# Patient Record
Sex: Male | Born: 1935
Health system: Southern US, Community
[De-identification: ages and names within clinical notes are randomized; demographics above are authoritative.]

## PROBLEM LIST (undated history)

## (undated) DIAGNOSIS — R55 Syncope and collapse: Secondary | ICD-10-CM

## (undated) DIAGNOSIS — D469 Myelodysplastic syndrome, unspecified: Secondary | ICD-10-CM

## (undated) DIAGNOSIS — I10 Essential (primary) hypertension: Secondary | ICD-10-CM

## (undated) DIAGNOSIS — I455 Other specified heart block: Secondary | ICD-10-CM

## (undated) DIAGNOSIS — E119 Type 2 diabetes mellitus without complications: Secondary | ICD-10-CM

## (undated) DIAGNOSIS — Z950 Presence of cardiac pacemaker: Secondary | ICD-10-CM

## (undated) HISTORY — PX: CATARACT EXTRACTION: SUR2

## (undated) HISTORY — PX: EYE SURGERY: SHX253

---

## 2015-12-24 HISTORY — PX: PORTACATH PLACEMENT: SHX2246

## 2015-12-24 HISTORY — PX: BONE MARROW BIOPSY: SHX199

## 2016-08-15 DIAGNOSIS — G5621 Lesion of ulnar nerve, right upper limb: Secondary | ICD-10-CM | POA: Diagnosis not present

## 2016-08-15 DIAGNOSIS — G5622 Lesion of ulnar nerve, left upper limb: Secondary | ICD-10-CM | POA: Diagnosis not present

## 2016-08-15 DIAGNOSIS — G5601 Carpal tunnel syndrome, right upper limb: Secondary | ICD-10-CM | POA: Diagnosis not present

## 2016-08-15 DIAGNOSIS — G5602 Carpal tunnel syndrome, left upper limb: Secondary | ICD-10-CM | POA: Diagnosis not present

## 2016-08-15 DIAGNOSIS — G629 Polyneuropathy, unspecified: Secondary | ICD-10-CM | POA: Diagnosis not present

## 2016-12-31 DIAGNOSIS — R0609 Other forms of dyspnea: Secondary | ICD-10-CM | POA: Diagnosis not present

## 2016-12-31 DIAGNOSIS — D649 Anemia, unspecified: Secondary | ICD-10-CM | POA: Diagnosis not present

## 2016-12-31 DIAGNOSIS — I119 Hypertensive heart disease without heart failure: Secondary | ICD-10-CM | POA: Diagnosis not present

## 2016-12-31 DIAGNOSIS — E119 Type 2 diabetes mellitus without complications: Secondary | ICD-10-CM | POA: Diagnosis not present

## 2017-01-02 DIAGNOSIS — D709 Neutropenia, unspecified: Secondary | ICD-10-CM | POA: Diagnosis not present

## 2017-01-02 DIAGNOSIS — D473 Essential (hemorrhagic) thrombocythemia: Secondary | ICD-10-CM | POA: Diagnosis not present

## 2017-01-02 DIAGNOSIS — D61818 Other pancytopenia: Secondary | ICD-10-CM | POA: Diagnosis not present

## 2017-01-02 DIAGNOSIS — D649 Anemia, unspecified: Secondary | ICD-10-CM | POA: Diagnosis not present

## 2017-01-07 DIAGNOSIS — R0600 Dyspnea, unspecified: Secondary | ICD-10-CM | POA: Diagnosis not present

## 2017-01-09 DIAGNOSIS — R42 Dizziness and giddiness: Secondary | ICD-10-CM | POA: Diagnosis not present

## 2017-01-09 DIAGNOSIS — D649 Anemia, unspecified: Secondary | ICD-10-CM | POA: Diagnosis not present

## 2017-01-09 DIAGNOSIS — E119 Type 2 diabetes mellitus without complications: Secondary | ICD-10-CM | POA: Diagnosis not present

## 2017-01-09 DIAGNOSIS — D709 Neutropenia, unspecified: Secondary | ICD-10-CM | POA: Diagnosis not present

## 2017-01-09 DIAGNOSIS — D509 Iron deficiency anemia, unspecified: Secondary | ICD-10-CM | POA: Diagnosis not present

## 2017-01-09 DIAGNOSIS — I495 Sick sinus syndrome: Secondary | ICD-10-CM | POA: Diagnosis not present

## 2017-01-09 DIAGNOSIS — D473 Essential (hemorrhagic) thrombocythemia: Secondary | ICD-10-CM | POA: Diagnosis not present

## 2017-01-09 DIAGNOSIS — D72819 Decreased white blood cell count, unspecified: Secondary | ICD-10-CM | POA: Diagnosis not present

## 2017-01-09 DIAGNOSIS — I119 Hypertensive heart disease without heart failure: Secondary | ICD-10-CM | POA: Diagnosis not present

## 2017-01-17 DIAGNOSIS — R0609 Other forms of dyspnea: Secondary | ICD-10-CM | POA: Diagnosis not present

## 2017-01-17 DIAGNOSIS — R0600 Dyspnea, unspecified: Secondary | ICD-10-CM | POA: Diagnosis not present

## 2017-01-20 DIAGNOSIS — H903 Sensorineural hearing loss, bilateral: Secondary | ICD-10-CM | POA: Diagnosis not present

## 2017-01-20 DIAGNOSIS — R269 Unspecified abnormalities of gait and mobility: Secondary | ICD-10-CM | POA: Diagnosis not present

## 2017-01-20 DIAGNOSIS — E119 Type 2 diabetes mellitus without complications: Secondary | ICD-10-CM | POA: Diagnosis not present

## 2017-01-20 DIAGNOSIS — H8101 Meniere's disease, right ear: Secondary | ICD-10-CM | POA: Diagnosis not present

## 2017-01-20 DIAGNOSIS — D649 Anemia, unspecified: Secondary | ICD-10-CM | POA: Diagnosis not present

## 2017-01-20 DIAGNOSIS — D709 Neutropenia, unspecified: Secondary | ICD-10-CM | POA: Diagnosis not present

## 2017-01-20 DIAGNOSIS — D473 Essential (hemorrhagic) thrombocythemia: Secondary | ICD-10-CM | POA: Diagnosis not present

## 2017-01-22 DIAGNOSIS — R0609 Other forms of dyspnea: Secondary | ICD-10-CM | POA: Diagnosis not present

## 2017-01-24 DIAGNOSIS — D649 Anemia, unspecified: Secondary | ICD-10-CM | POA: Diagnosis not present

## 2017-01-24 DIAGNOSIS — Z79899 Other long term (current) drug therapy: Secondary | ICD-10-CM | POA: Diagnosis not present

## 2017-01-24 DIAGNOSIS — R0609 Other forms of dyspnea: Secondary | ICD-10-CM | POA: Diagnosis not present

## 2017-01-25 DIAGNOSIS — D649 Anemia, unspecified: Secondary | ICD-10-CM | POA: Diagnosis not present

## 2017-01-25 DIAGNOSIS — D709 Neutropenia, unspecified: Secondary | ICD-10-CM | POA: Diagnosis not present

## 2017-01-25 DIAGNOSIS — D473 Essential (hemorrhagic) thrombocythemia: Secondary | ICD-10-CM | POA: Diagnosis not present

## 2017-01-25 DIAGNOSIS — Z23 Encounter for immunization: Secondary | ICD-10-CM | POA: Diagnosis not present

## 2017-01-26 DIAGNOSIS — G4710 Hypersomnia, unspecified: Secondary | ICD-10-CM | POA: Diagnosis not present

## 2017-01-30 DIAGNOSIS — D471 Chronic myeloproliferative disease: Secondary | ICD-10-CM | POA: Diagnosis not present

## 2017-01-30 DIAGNOSIS — D649 Anemia, unspecified: Secondary | ICD-10-CM | POA: Diagnosis not present

## 2017-01-30 DIAGNOSIS — D473 Essential (hemorrhagic) thrombocythemia: Secondary | ICD-10-CM | POA: Diagnosis not present

## 2017-01-30 DIAGNOSIS — D709 Neutropenia, unspecified: Secondary | ICD-10-CM | POA: Diagnosis not present

## 2017-01-30 DIAGNOSIS — D61818 Other pancytopenia: Secondary | ICD-10-CM | POA: Diagnosis not present

## 2017-02-04 DIAGNOSIS — D471 Chronic myeloproliferative disease: Secondary | ICD-10-CM | POA: Diagnosis not present

## 2017-02-05 DIAGNOSIS — Z7189 Other specified counseling: Secondary | ICD-10-CM | POA: Diagnosis not present

## 2017-02-05 DIAGNOSIS — Z79899 Other long term (current) drug therapy: Secondary | ICD-10-CM | POA: Diagnosis not present

## 2017-02-05 DIAGNOSIS — D471 Chronic myeloproliferative disease: Secondary | ICD-10-CM | POA: Diagnosis not present

## 2017-02-12 DIAGNOSIS — D471 Chronic myeloproliferative disease: Secondary | ICD-10-CM | POA: Diagnosis not present

## 2017-02-15 DIAGNOSIS — D649 Anemia, unspecified: Secondary | ICD-10-CM | POA: Diagnosis not present

## 2017-02-15 DIAGNOSIS — E119 Type 2 diabetes mellitus without complications: Secondary | ICD-10-CM | POA: Diagnosis not present

## 2017-02-15 DIAGNOSIS — G40909 Epilepsy, unspecified, not intractable, without status epilepticus: Secondary | ICD-10-CM | POA: Diagnosis not present

## 2017-02-15 DIAGNOSIS — R569 Unspecified convulsions: Secondary | ICD-10-CM | POA: Diagnosis not present

## 2017-02-15 DIAGNOSIS — D72819 Decreased white blood cell count, unspecified: Secondary | ICD-10-CM | POA: Diagnosis not present

## 2017-02-15 DIAGNOSIS — D709 Neutropenia, unspecified: Secondary | ICD-10-CM | POA: Diagnosis not present

## 2017-02-15 DIAGNOSIS — R55 Syncope and collapse: Secondary | ICD-10-CM | POA: Diagnosis not present

## 2017-02-15 DIAGNOSIS — R079 Chest pain, unspecified: Secondary | ICD-10-CM | POA: Diagnosis not present

## 2017-02-15 DIAGNOSIS — I6782 Cerebral ischemia: Secondary | ICD-10-CM | POA: Diagnosis not present

## 2017-02-16 DIAGNOSIS — I6529 Occlusion and stenosis of unspecified carotid artery: Secondary | ICD-10-CM | POA: Diagnosis not present

## 2017-02-16 DIAGNOSIS — I6501 Occlusion and stenosis of right vertebral artery: Secondary | ICD-10-CM | POA: Diagnosis present

## 2017-02-16 DIAGNOSIS — E119 Type 2 diabetes mellitus without complications: Secondary | ICD-10-CM | POA: Diagnosis present

## 2017-02-16 DIAGNOSIS — D72819 Decreased white blood cell count, unspecified: Secondary | ICD-10-CM | POA: Diagnosis not present

## 2017-02-16 DIAGNOSIS — I10 Essential (primary) hypertension: Secondary | ICD-10-CM | POA: Diagnosis present

## 2017-02-16 DIAGNOSIS — G4733 Obstructive sleep apnea (adult) (pediatric): Secondary | ICD-10-CM | POA: Diagnosis present

## 2017-02-16 DIAGNOSIS — Z79899 Other long term (current) drug therapy: Secondary | ICD-10-CM | POA: Diagnosis not present

## 2017-02-16 DIAGNOSIS — H8109 Meniere's disease, unspecified ear: Secondary | ICD-10-CM | POA: Diagnosis present

## 2017-02-16 DIAGNOSIS — D709 Neutropenia, unspecified: Secondary | ICD-10-CM | POA: Diagnosis present

## 2017-02-16 DIAGNOSIS — R55 Syncope and collapse: Secondary | ICD-10-CM | POA: Diagnosis not present

## 2017-02-16 DIAGNOSIS — G40909 Epilepsy, unspecified, not intractable, without status epilepticus: Secondary | ICD-10-CM | POA: Diagnosis not present

## 2017-02-16 DIAGNOSIS — I482 Chronic atrial fibrillation: Secondary | ICD-10-CM | POA: Diagnosis present

## 2017-02-16 DIAGNOSIS — R32 Unspecified urinary incontinence: Secondary | ICD-10-CM | POA: Diagnosis present

## 2017-02-16 DIAGNOSIS — R569 Unspecified convulsions: Secondary | ICD-10-CM | POA: Diagnosis present

## 2017-02-16 DIAGNOSIS — I480 Paroxysmal atrial fibrillation: Secondary | ICD-10-CM | POA: Diagnosis present

## 2017-02-16 DIAGNOSIS — F418 Other specified anxiety disorders: Secondary | ICD-10-CM | POA: Diagnosis present

## 2017-02-16 DIAGNOSIS — I6523 Occlusion and stenosis of bilateral carotid arteries: Secondary | ICD-10-CM | POA: Diagnosis not present

## 2017-02-16 DIAGNOSIS — D471 Chronic myeloproliferative disease: Secondary | ICD-10-CM | POA: Diagnosis present

## 2017-02-16 DIAGNOSIS — Z794 Long term (current) use of insulin: Secondary | ICD-10-CM | POA: Diagnosis not present

## 2017-02-16 DIAGNOSIS — D649 Anemia, unspecified: Secondary | ICD-10-CM | POA: Diagnosis present

## 2017-02-19 DIAGNOSIS — D471 Chronic myeloproliferative disease: Secondary | ICD-10-CM | POA: Diagnosis not present

## 2017-02-19 DIAGNOSIS — I119 Hypertensive heart disease without heart failure: Secondary | ICD-10-CM | POA: Diagnosis not present

## 2017-02-24 DIAGNOSIS — R05 Cough: Secondary | ICD-10-CM | POA: Diagnosis not present

## 2017-02-24 DIAGNOSIS — D630 Anemia in neoplastic disease: Secondary | ICD-10-CM | POA: Diagnosis not present

## 2017-02-24 DIAGNOSIS — R0600 Dyspnea, unspecified: Secondary | ICD-10-CM | POA: Diagnosis not present

## 2017-02-24 DIAGNOSIS — Z79899 Other long term (current) drug therapy: Secondary | ICD-10-CM | POA: Diagnosis not present

## 2017-02-24 DIAGNOSIS — E119 Type 2 diabetes mellitus without complications: Secondary | ICD-10-CM | POA: Diagnosis not present

## 2017-02-24 DIAGNOSIS — D649 Anemia, unspecified: Secondary | ICD-10-CM | POA: Diagnosis not present

## 2017-02-24 DIAGNOSIS — Z8589 Personal history of malignant neoplasm of other organs and systems: Secondary | ICD-10-CM | POA: Diagnosis not present

## 2017-02-24 DIAGNOSIS — E1165 Type 2 diabetes mellitus with hyperglycemia: Secondary | ICD-10-CM | POA: Diagnosis not present

## 2017-02-24 DIAGNOSIS — I10 Essential (primary) hypertension: Secondary | ICD-10-CM | POA: Diagnosis not present

## 2017-02-24 DIAGNOSIS — R531 Weakness: Secondary | ICD-10-CM | POA: Diagnosis not present

## 2017-02-24 DIAGNOSIS — R0602 Shortness of breath: Secondary | ICD-10-CM | POA: Diagnosis not present

## 2017-02-24 DIAGNOSIS — D696 Thrombocytopenia, unspecified: Secondary | ICD-10-CM | POA: Diagnosis not present

## 2017-02-24 DIAGNOSIS — D709 Neutropenia, unspecified: Secondary | ICD-10-CM | POA: Diagnosis not present

## 2017-02-24 DIAGNOSIS — Z794 Long term (current) use of insulin: Secondary | ICD-10-CM | POA: Diagnosis not present

## 2017-02-25 DIAGNOSIS — R0600 Dyspnea, unspecified: Secondary | ICD-10-CM | POA: Diagnosis not present

## 2017-02-26 DIAGNOSIS — D471 Chronic myeloproliferative disease: Secondary | ICD-10-CM | POA: Diagnosis not present

## 2017-02-26 DIAGNOSIS — N289 Disorder of kidney and ureter, unspecified: Secondary | ICD-10-CM | POA: Diagnosis not present

## 2017-02-26 DIAGNOSIS — D709 Neutropenia, unspecified: Secondary | ICD-10-CM | POA: Diagnosis not present

## 2017-02-26 DIAGNOSIS — D649 Anemia, unspecified: Secondary | ICD-10-CM | POA: Diagnosis not present

## 2017-03-05 DIAGNOSIS — R4020 Unspecified coma: Secondary | ICD-10-CM | POA: Diagnosis not present

## 2017-03-05 DIAGNOSIS — I119 Hypertensive heart disease without heart failure: Secondary | ICD-10-CM | POA: Diagnosis not present

## 2017-03-05 DIAGNOSIS — D649 Anemia, unspecified: Secondary | ICD-10-CM | POA: Diagnosis not present

## 2017-03-06 DIAGNOSIS — R55 Syncope and collapse: Secondary | ICD-10-CM | POA: Diagnosis not present

## 2017-03-06 DIAGNOSIS — R600 Localized edema: Secondary | ICD-10-CM | POA: Diagnosis not present

## 2017-03-11 DIAGNOSIS — D471 Chronic myeloproliferative disease: Secondary | ICD-10-CM | POA: Diagnosis not present

## 2017-03-11 DIAGNOSIS — D649 Anemia, unspecified: Secondary | ICD-10-CM | POA: Diagnosis not present

## 2017-03-11 DIAGNOSIS — E875 Hyperkalemia: Secondary | ICD-10-CM | POA: Diagnosis not present

## 2017-03-11 DIAGNOSIS — R55 Syncope and collapse: Secondary | ICD-10-CM | POA: Diagnosis not present

## 2017-03-11 DIAGNOSIS — R7989 Other specified abnormal findings of blood chemistry: Secondary | ICD-10-CM | POA: Diagnosis not present

## 2017-03-11 DIAGNOSIS — R569 Unspecified convulsions: Secondary | ICD-10-CM | POA: Diagnosis not present

## 2017-03-19 DIAGNOSIS — Z95818 Presence of other cardiac implants and grafts: Secondary | ICD-10-CM | POA: Diagnosis not present

## 2017-03-25 DIAGNOSIS — I119 Hypertensive heart disease without heart failure: Secondary | ICD-10-CM | POA: Diagnosis not present

## 2017-03-25 DIAGNOSIS — R911 Solitary pulmonary nodule: Secondary | ICD-10-CM | POA: Diagnosis not present

## 2017-03-26 DIAGNOSIS — D473 Essential (hemorrhagic) thrombocythemia: Secondary | ICD-10-CM | POA: Diagnosis not present

## 2017-03-26 DIAGNOSIS — D709 Neutropenia, unspecified: Secondary | ICD-10-CM | POA: Diagnosis not present

## 2017-03-26 DIAGNOSIS — N289 Disorder of kidney and ureter, unspecified: Secondary | ICD-10-CM | POA: Diagnosis not present

## 2017-03-26 DIAGNOSIS — D471 Chronic myeloproliferative disease: Secondary | ICD-10-CM | POA: Diagnosis not present

## 2017-04-19 DIAGNOSIS — Z95818 Presence of other cardiac implants and grafts: Secondary | ICD-10-CM | POA: Diagnosis not present

## 2017-05-09 DIAGNOSIS — D7581 Myelofibrosis: Secondary | ICD-10-CM | POA: Diagnosis not present

## 2017-05-09 DIAGNOSIS — I480 Paroxysmal atrial fibrillation: Secondary | ICD-10-CM | POA: Diagnosis not present

## 2017-05-09 DIAGNOSIS — H8113 Benign paroxysmal vertigo, bilateral: Secondary | ICD-10-CM | POA: Diagnosis not present

## 2017-05-09 DIAGNOSIS — E1165 Type 2 diabetes mellitus with hyperglycemia: Secondary | ICD-10-CM | POA: Diagnosis not present

## 2017-05-09 DIAGNOSIS — I119 Hypertensive heart disease without heart failure: Secondary | ICD-10-CM | POA: Diagnosis not present

## 2017-05-20 DIAGNOSIS — Z95818 Presence of other cardiac implants and grafts: Secondary | ICD-10-CM | POA: Diagnosis not present

## 2017-06-05 ENCOUNTER — Encounter (HOSPITAL_COMMUNITY): Payer: Self-pay | Admitting: *Deleted

## 2017-06-05 ENCOUNTER — Emergency Department (HOSPITAL_COMMUNITY)
Admission: EM | Admit: 2017-06-05 | Discharge: 2017-06-06 | Disposition: A | Discharge status: Home / Self Care | Payer: Medicare Other | Attending: Emergency Medicine | Admitting: Emergency Medicine

## 2017-06-05 DIAGNOSIS — E1165 Type 2 diabetes mellitus with hyperglycemia: Secondary | ICD-10-CM | POA: Diagnosis not present

## 2017-06-05 DIAGNOSIS — R739 Hyperglycemia, unspecified: Secondary | ICD-10-CM

## 2017-06-05 DIAGNOSIS — Z794 Long term (current) use of insulin: Secondary | ICD-10-CM | POA: Diagnosis not present

## 2017-06-05 DIAGNOSIS — Z79899 Other long term (current) drug therapy: Secondary | ICD-10-CM | POA: Diagnosis not present

## 2017-06-05 DIAGNOSIS — N471 Phimosis: Secondary | ICD-10-CM | POA: Diagnosis not present

## 2017-06-05 DIAGNOSIS — I10 Essential (primary) hypertension: Secondary | ICD-10-CM | POA: Insufficient documentation

## 2017-06-05 DIAGNOSIS — N4829 Other inflammatory disorders of penis: Secondary | ICD-10-CM | POA: Diagnosis present

## 2017-06-05 DIAGNOSIS — N481 Balanitis: Secondary | ICD-10-CM | POA: Diagnosis not present

## 2017-06-05 HISTORY — DX: Type 2 diabetes mellitus without complications: E11.9

## 2017-06-05 HISTORY — DX: Essential (primary) hypertension: I10

## 2017-06-05 LAB — CBC
HCT: 27.6 % — ABNORMAL LOW (ref 39.0–52.0)
Hemoglobin: 8.5 g/dL — ABNORMAL LOW (ref 13.0–17.0)
MCH: 22.2 pg — ABNORMAL LOW (ref 26.0–34.0)
MCHC: 30.8 g/dL (ref 30.0–36.0)
MCV: 72.1 fL — ABNORMAL LOW (ref 78.0–100.0)
Platelets: 327 10*3/uL (ref 150–400)
RBC: 3.83 MIL/uL — ABNORMAL LOW (ref 4.22–5.81)
RDW: 24.3 % — ABNORMAL HIGH (ref 11.5–15.5)
WBC: 2.4 10*3/uL — ABNORMAL LOW (ref 4.0–10.5)

## 2017-06-05 LAB — COMPREHENSIVE METABOLIC PANEL
ALT: 12 U/L — ABNORMAL LOW (ref 17–63)
AST: 22 U/L (ref 15–41)
Albumin: 3.4 g/dL — ABNORMAL LOW (ref 3.5–5.0)
Alkaline Phosphatase: 141 U/L — ABNORMAL HIGH (ref 38–126)
Anion gap: 8 (ref 5–15)
BUN: 17 mg/dL (ref 6–20)
CO2: 23 mmol/L (ref 22–32)
Calcium: 8.5 mg/dL — ABNORMAL LOW (ref 8.9–10.3)
Chloride: 96 mmol/L — ABNORMAL LOW (ref 101–111)
Creatinine, Ser: 1.33 mg/dL — ABNORMAL HIGH (ref 0.61–1.24)
GFR calc Af Amer: 55 mL/min — ABNORMAL LOW (ref >60)
GFR calc non Af Amer: 47 mL/min — ABNORMAL LOW (ref >60)
Glucose, Bld: 547 mg/dL (ref 65–99)
Potassium: 4.2 mmol/L (ref 3.5–5.1)
Sodium: 127 mmol/L — ABNORMAL LOW (ref 135–145)
Total Bilirubin: 1 mg/dL (ref 0.3–1.2)
Total Protein: 7.2 g/dL (ref 6.5–8.1)

## 2017-06-05 LAB — URINALYSIS, ROUTINE W REFLEX MICROSCOPIC
Bilirubin Urine: NEGATIVE
Glucose, UA: >=500 mg/dL — AB
Hgb urine dipstick: NEGATIVE
Ketones, ur: NEGATIVE mg/dL
Leukocytes, UA: NEGATIVE
Nitrite: NEGATIVE
Protein, ur: NEGATIVE mg/dL
Specific Gravity, Urine: 1.025 (ref 1.005–1.030)
pH: 6 (ref 5.0–8.0)

## 2017-06-05 LAB — I-STAT CG4 LACTIC ACID, ED
Lactic Acid, Venous: 1.18 mmol/L (ref 0.5–1.9)
Lactic Acid, Venous: 1.43 mmol/L (ref 0.5–1.9)

## 2017-06-05 MED ORDER — SODIUM CHLORIDE 0.9 % IV BOLUS (SEPSIS)
2000.0000 mL | Freq: Once | INTRAVENOUS | Status: AC
  Administered 2017-06-06: 2000 mL via INTRAVENOUS

## 2017-06-05 MED ORDER — INSULIN ASPART 100 UNIT/ML ~~LOC~~ SOLN
15.0000 [IU] | Freq: Once | SUBCUTANEOUS | Status: AC
  Administered 2017-06-06: 15 [IU] via INTRAVENOUS
  Filled 2017-06-05: qty 1

## 2017-06-05 NOTE — ED Provider Notes (Signed)
[facility] DEPT Provider Note   CSN: [id number] Arrival date & time: 06/05/17  2017  By signing my name below, I, [name], attest that this documentation has been prepared under the direction and in the presence of Deno Etienne, DO. Electronically Signed: [name], ED Scribe. 06/05/17. 11:13 PM.  History   Chief Complaint Chief Complaint  Patient presents with  . Groin Swelling   The history is provided by the patient. No language interpreter was used.   HPI Comments: Aaron Nelson is a 83 y.o. male with a PMHx of DM, who presents to the Emergency Department complaining of a moderate, gradually worsening area of pain and swelling to the penis onset 2 weeks ago. Pt states he has not taken his 30 U insulin the past 2 days. Pt reports associated dysuria. No hx of similar symptoms. Pt denies concern for a STD. Pt denies SOB, fever, abdominal pain or any other complaints at this time.  Past Medical History:  Diagnosis Date  . Diabetes mellitus without complication ([place])   . Hypertension   . Leukemia ([place])     There are no active problems to display for this patient.   History reviewed. No pertinent surgical history.     Home Medications    Prior to Admission medications   Medication Sig Start Date End Date Taking? Authorizing Provider  insulin glargine (LANTUS) 100 UNIT/ML injection Inject 30 Units into the skin daily.   Yes [provider]  meclizine (ANTIVERT) 12.5 MG tablet Take 12.5 mg by mouth every evening.   Yes [provider]  clotrimazole (LOTRIMIN) 1 % cream Apply to affected area 2 times daily 06/06/17   Deno Etienne, DO    Family History No family history on file.  Social History Social History  Substance Use Topics  . Smoking status: Never Smoker  . Smokeless tobacco: Never Used  . Alcohol use No     Allergies   Patient has no known allergies.   Review of Systems Review of Systems  Constitutional: Negative for chills  and fever.  HENT: Negative for congestion and facial swelling.   Eyes: Negative for discharge and visual disturbance.  Respiratory: Negative for shortness of breath.   Cardiovascular: Negative for chest pain and palpitations.  Gastrointestinal: Negative for abdominal pain, diarrhea and vomiting.  Genitourinary: Positive for dysuria and penile swelling.  Musculoskeletal: Negative for arthralgias and myalgias.  Skin: Negative for color change and rash.  Neurological: Negative for tremors, syncope and headaches.  Psychiatric/Behavioral: Negative for confusion and dysphoric mood.     Physical Exam Updated Vital Signs BP (!) 188/66 (BP Location: Right Arm)   Pulse 67   Temp 98.1 F (36.7 C) (Oral)   Resp 15   Ht 5\' 5"  (1.651 m)   Wt 147 lb (66.7 kg)   SpO2 100%   BMI 24.46 kg/m   Physical Exam  Constitutional: He is oriented to person, place, and time. He appears well-developed and well-nourished.  Well appearing and non toxic  HENT:  Head: Normocephalic and atraumatic.  Eyes: EOM are normal. Pupils are equal, round, and reactive to light.  Neck: Normal range of motion. Neck supple. No JVD present.  Cardiovascular: Normal rate and regular rhythm.  Exam reveals no gallop and no friction rub.   No murmur heard. Pulmonary/Chest: No respiratory distress. He has no wheezes.  Abdominal: He exhibits no distension. There is no rebound and no guarding.  Genitourinary: Right testis shows no mass. Left testis shows no mass.  Phimosis present. Discharge found.  Genitourinary Comments: Exam performed with chaperone present. Obvious phimosis. Unable to retract the foreskin over the head do of the penis. Swelling over the foreskin. Yeasty like discharge. No hernias or masses.   Musculoskeletal: Normal range of motion.  Neurological: He is alert and oriented to person, place, and time.  Skin: No rash noted. No pallor.  Psychiatric: He has a normal mood and affect. His behavior is normal.    Nursing note and vitals reviewed.    ED Treatments / Results  DIAGNOSTIC STUDIES: Oxygen Saturation is 100% on RA, normal by my interpretation.   COORDINATION OF CARE: 11:07 PM-Discussed next steps with pt including use of an anti-yeast ointment and controlling DM. Pt verbalized understanding and is agreeable with the plan.   Labs (all labs ordered are listed, but only abnormal results are displayed) Labs Reviewed  COMPREHENSIVE METABOLIC PANEL - Abnormal; Notable for the following:       Result Value   Sodium 127 (*)    Chloride 96 (*)    Glucose, Bld 547 (*)    Creatinine, Ser 1.33 (*)    Calcium 8.5 (*)    Albumin 3.4 (*)    ALT 12 (*)    Alkaline Phosphatase 141 (*)    GFR calc non Af Amer 47 (*)    GFR calc Af Amer 55 (*)    All other components within normal limits  CBC - Abnormal; Notable for the following:    WBC 2.4 (*)    RBC 3.83 (*)    Hemoglobin 8.5 (*)    HCT 27.6 (*)    MCV 72.1 (*)    MCH 22.2 (*)    RDW 24.3 (*)    All other components within normal limits  URINALYSIS, ROUTINE W REFLEX MICROSCOPIC - Abnormal; Notable for the following:    Glucose, UA >=500 (*)    Bacteria, UA RARE (*)    Squamous Epithelial / LPF 0-5 (*)    All other components within normal limits  I-STAT CG4 LACTIC ACID, ED  I-STAT CG4 LACTIC ACID, ED    EKG  EKG Interpretation None       Radiology No results found.  Procedures Procedures (including critical care time)  Medications Ordered in ED Medications  sodium chloride 0.9 % bolus 2,000 mL (0 mLs Intravenous Stopped 06/06/17 0207)  insulin aspart (novoLOG) injection 15 Units (15 Units Intravenous Given 06/06/17 0042)     Initial Impression / Assessment and Plan / ED Course  I have reviewed the triage vital signs and the nursing notes.  Pertinent labs & imaging results that were available during my care of the patient were reviewed by me and considered in my medical decision making (see chart for  details).     83 yo M With balanitis. Going on for the past couple weeks. Having difficulty with his blood sugars because he has not been taking his insulin. I counseled him on taking his home medications as this will help him significantly. I counseled him on hygiene. He has a phimosis as well. Blood sugar in the 500s. He was given 2 L of fluid and insulin with significant improvement. Follow with his PCP.  5:34 AM:  I have discussed the diagnosis/risks/treatment options with the patient and family and believe the pt to be eligible for discharge home to follow-up with PCP. We also discussed returning to the ED immediately if new or worsening sx occur. We discussed the sx which are most  concerning (e.g., sudden worsening pain, fever, inability to tolerate by mouth) that necessitate immediate return. Medications administered to the patient during their visit and any new prescriptions provided to the patient are listed below.  Medications given during this visit Medications  sodium chloride 0.9 % bolus 2,000 mL (0 mLs Intravenous Stopped 06/06/17 0207)  insulin aspart (novoLOG) injection 15 Units (15 Units Intravenous Given 06/06/17 0042)     The patient appears reasonably screen and/or stabilized for discharge and I doubt any other medical condition or other [facility] requiring further screening, evaluation, or treatment in the ED at this time prior to discharge.    Final Clinical Impressions(s) / ED Diagnoses   Final diagnoses:  Balanitis  Hyperglycemia  Phimosis    New Prescriptions Discharge Medication List as of 06/06/2017  1:53 AM    START taking these medications   Details  clotrimazole (LOTRIMIN) 1 % cream Apply to affected area 2 times daily, Print        I personally performed the services described in this documentation, which was scribed in my presence. The recorded information has been reviewed and is accurate.     Deno Etienne, DO 06/06/17 [phone]

## 2017-06-05 NOTE — ED Notes (Signed)
Lab called a high blood sugar   pts acuity increased to a 2

## 2017-06-05 NOTE — ED Triage Notes (Signed)
The pt just moved here from [place] with family here

## 2017-06-05 NOTE — ED Triage Notes (Signed)
Infection on his penis for 2 weeks  No n v or a temp

## 2017-06-06 MED ORDER — CLOTRIMAZOLE 1 % EX CREA: TOPICAL_CREAM | CUTANEOUS | 0 refills | Status: DC

## 2017-06-06 NOTE — ED Notes (Signed)
IV attempted x 2, right forearm and right AC. Both infiltrated.

## 2017-06-06 NOTE — Discharge Instructions (Signed)
Take your insulin as prescribed!!  Warm soaks 2x a day with gentle retraction of your foreskin.  No soap, use a warm wash cloth

## 2017-06-11 ENCOUNTER — Encounter: Payer: Self-pay | Admitting: Internal Medicine

## 2017-06-11 ENCOUNTER — Ambulatory Visit: Payer: Medicare Other | Attending: Internal Medicine | Admitting: Internal Medicine

## 2017-06-11 VITALS — BP 157/66 | HR 69 | Temp 98.5°F | Resp 18 | Ht 65.0 in | Wt 149.0 lb

## 2017-06-11 DIAGNOSIS — N401 Enlarged prostate with lower urinary tract symptoms: Secondary | ICD-10-CM

## 2017-06-11 DIAGNOSIS — I495 Sick sinus syndrome: Secondary | ICD-10-CM

## 2017-06-11 DIAGNOSIS — Z950 Presence of cardiac pacemaker: Secondary | ICD-10-CM | POA: Diagnosis not present

## 2017-06-11 DIAGNOSIS — E119 Type 2 diabetes mellitus without complications: Secondary | ICD-10-CM

## 2017-06-11 DIAGNOSIS — C9591 Leukemia, unspecified, in remission: Secondary | ICD-10-CM

## 2017-06-11 DIAGNOSIS — Z794 Long term (current) use of insulin: Secondary | ICD-10-CM | POA: Diagnosis not present

## 2017-06-11 DIAGNOSIS — I10 Essential (primary) hypertension: Secondary | ICD-10-CM | POA: Insufficient documentation

## 2017-06-11 LAB — GLUCOSE, POCT (MANUAL RESULT ENTRY)
POC GLUCOSE: 436 mg/dL — AB (ref 70–99)
POC Glucose: 424 mg/dl — AB (ref 70–99)

## 2017-06-11 MED ORDER — INSULIN ASPART 100 UNIT/ML ~~LOC~~ SOLN: 20.0000 [IU] | Freq: Once | SUBCUTANEOUS | Status: DC

## 2017-06-11 NOTE — Patient Instructions (Signed)
Diabetes Mellitus and Food It is important for you to manage your blood sugar (glucose) level. Your blood glucose level can be greatly affected by what you eat. Eating healthier foods in the appropriate amounts throughout the day at about the same time each day will help you control your blood glucose level. It can also help slow or prevent worsening of your diabetes mellitus. Healthy eating may even help you improve the level of your blood pressure and reach or maintain a healthy weight. General recommendations for healthful eating and cooking habits include:  Eating meals and snacks regularly. Avoid going long periods of time without eating to lose weight.  Eating a diet that consists mainly of plant-based foods, such as fruits, vegetables, nuts, legumes, and whole grains.  Using low-heat cooking methods, such as baking, instead of high-heat cooking methods, such as deep frying.  Work with your dietitian to make sure you understand how to use the Nutrition Facts information on food labels. How can food affect me? Carbohydrates Carbohydrates affect your blood glucose level more than any other type of food. Your dietitian will help you determine how many carbohydrates to eat at each meal and teach you how to count carbohydrates. Counting carbohydrates is important to keep your blood glucose at a healthy level, especially if you are using insulin or taking certain medicines for diabetes mellitus. Alcohol Alcohol can cause sudden decreases in blood glucose (hypoglycemia), especially if you use insulin or take certain medicines for diabetes mellitus. Hypoglycemia can be a life-threatening condition. Symptoms of hypoglycemia (sleepiness, dizziness, and disorientation) are similar to symptoms of having too much alcohol. If your health care provider has given you approval to drink alcohol, do so in moderation and use the following guidelines:  Women should not have more than one drink per day, and men  should not have more than two drinks per day. One drink is equal to: ? 12 oz of beer. ? 5 oz of wine. ? 1 oz of hard liquor.  Do not drink on an empty stomach.  Keep yourself hydrated. Have water, diet soda, or unsweetened iced tea.  Regular soda, juice, and other mixers might contain a lot of carbohydrates and should be counted.  What foods are not recommended? As you make food choices, it is important to remember that all foods are not the same. Some foods have fewer nutrients per serving than other foods, even though they might have the same number of calories or carbohydrates. It is difficult to get your body what it needs when you eat foods with fewer nutrients. Examples of foods that you should avoid that are high in calories and carbohydrates but low in nutrients include:  Trans fats (most processed foods list trans fats on the Nutrition Facts label).  Regular soda.  Juice.  Candy.  Sweets, such as cake, pie, doughnuts, and cookies.  Fried foods.  What foods can I eat? Eat nutrient-rich foods, which will nourish your body and keep you healthy. The food you should eat also will depend on several factors, including:  The calories you need.  The medicines you take.  Your weight.  Your blood glucose level.  Your blood pressure level.  Your cholesterol level.  You should eat a variety of foods, including:  Protein. ? Lean cuts of meat. ? Proteins low in saturated fats, such as fish, egg whites, and beans. Avoid processed meats.  Fruits and vegetables. ? Fruits and vegetables that may help control blood glucose levels, such as apples,   mangoes, and yams.  Dairy products. ? Choose fat-free or low-fat dairy products, such as milk, yogurt, and cheese.  Grains, bread, pasta, and rice. ? Choose whole grain products, such as multigrain bread, whole oats, and brown rice. These foods may help control blood pressure.  Fats. ? Foods containing healthful fats, such as  nuts, avocado, olive oil, canola oil, and fish.  Does everyone with diabetes mellitus have the same meal plan? Because every person with diabetes mellitus is different, there is not one meal plan that works for everyone. It is very important that you meet with a dietitian who will help you create a meal plan that is just right for you. This information is not intended to replace advice given to you by your health care provider. Make sure you discuss any questions you have with your health care provider. Document Released: 09/05/2005 Document Revised: 05/16/2016 Document Reviewed: 11/05/2013 Elsevier Interactive Patient Education  2017 [employer] Hypertension Hypertension, commonly called high blood pressure, is when the force of blood pumping through the arteries is too strong. The arteries are the blood vessels that carry blood from the heart throughout the body. Hypertension forces the heart to work harder to pump blood and may cause arteries to become narrow or stiff. Having untreated or uncontrolled hypertension can cause heart attacks, strokes, kidney disease, and other problems. A blood pressure reading consists of a higher number over a lower number. Ideally, your blood pressure should be below 120/80. The first ("top") number is called the systolic pressure. It is a measure of the pressure in your arteries as your heart beats. The second ("bottom") number is called the diastolic pressure. It is a measure of the pressure in your arteries as the heart relaxes. What are the causes? The cause of this condition is not known. What increases the risk? Some risk factors for high blood pressure are under your control. Others are not. Factors you can change  Smoking.  Having type 2 diabetes mellitus, high cholesterol, or both.  Not getting enough exercise or physical activity.  Being overweight.  Having too much fat, sugar, calories, or salt (sodium) in your diet.  Drinking too much  alcohol. Factors that are difficult or impossible to change  Having chronic kidney disease.  Having a family history of high blood pressure.  Age. Risk increases with age.  Race. You may be at higher risk if you are African-American.  Gender. Men are at higher risk than women before age 48. After age 2, women are at higher risk than men.  Having obstructive sleep apnea.  Stress. What are the signs or symptoms? Extremely high blood pressure (hypertensive crisis) may cause:  Headache.  Anxiety.  Shortness of breath.  Nosebleed.  Nausea and vomiting.  Severe chest pain.  Jerky movements you cannot control (seizures).  How is this diagnosed? This condition is diagnosed by measuring your blood pressure while you are seated, with your arm resting on a surface. The cuff of the blood pressure monitor will be placed directly against the skin of your upper arm at the level of your heart. It should be measured at least twice using the same arm. Certain conditions can cause a difference in blood pressure between your right and left arms. Certain factors can cause blood pressure readings to be lower or higher than normal (elevated) for a short period of time:  When your blood pressure is higher when you are in a health care provider's office than when you are at  home, this is called white coat hypertension. Most people with this condition do not need medicines.  When your blood pressure is higher at home than when you are in a health care provider's office, this is called masked hypertension. Most people with this condition may need medicines to control blood pressure.  If you have a high blood pressure reading during one visit or you have normal blood pressure with other risk factors:  You may be asked to return on a different day to have your blood pressure checked again.  You may be asked to monitor your blood pressure at home for 1 week or longer.  If you are diagnosed with  hypertension, you may have other blood or imaging tests to help your health care provider understand your overall risk for other conditions. How is this treated? This condition is treated by making healthy lifestyle changes, such as eating healthy foods, exercising more, and reducing your alcohol intake. Your health care provider may prescribe medicine if lifestyle changes are not enough to get your blood pressure under control, and if:  Your systolic blood pressure is above 130.  Your diastolic blood pressure is above 80.  Your personal target blood pressure may vary depending on your medical conditions, your age, and other factors. Follow these instructions at home: Eating and drinking  Eat a diet that is high in fiber and potassium, and low in sodium, added sugar, and fat. An example eating plan is called the DASH (Dietary Approaches to Stop Hypertension) diet. To eat this way: ? Eat plenty of fresh fruits and vegetables. Try to fill half of your plate at each meal with fruits and vegetables. ? Eat whole grains, such as whole wheat pasta, brown rice, or whole grain bread. Fill about one quarter of your plate with whole grains. ? Eat or drink low-fat dairy products, such as skim milk or low-fat yogurt. ? Avoid fatty cuts of meat, processed or cured meats, and poultry with skin. Fill about one quarter of your plate with lean proteins, such as fish, chicken without skin, beans, eggs, and tofu. ? Avoid premade and processed foods. These tend to be higher in sodium, added sugar, and fat.  Reduce your daily sodium intake. Most people with hypertension should eat less than 1,500 mg of sodium a day.  Limit alcohol intake to no more than 1 drink a day for nonpregnant women and 2 drinks a day for men. One drink equals 12 oz of beer, 5 oz of wine, or 1 oz of hard liquor. Lifestyle  Work with your health care provider to maintain a healthy body weight or to lose weight. Ask what an ideal weight is  for you.  Get at least 30 minutes of exercise that causes your heart to beat faster (aerobic exercise) most days of the week. Activities may include walking, swimming, or biking.  Include exercise to strengthen your muscles (resistance exercise), such as pilates or lifting weights, as part of your weekly exercise routine. Try to do these types of exercises for 30 minutes at least 3 days a week.  Do not use any products that contain nicotine or tobacco, such as cigarettes and e-cigarettes. If you need help quitting, ask your health care provider.  Monitor your blood pressure at home as told by your health care provider.  Keep all follow-up visits as told by your health care provider. This is important. Medicines  Take over-the-counter and prescription medicines only as told by your health care provider. Follow  directions carefully. Blood pressure medicines must be taken as prescribed.  Do not skip doses of blood pressure medicine. Doing this puts you at risk for problems and can make the medicine less effective.  Ask your health care provider about side effects or reactions to medicines that you should watch for. Contact a health care provider if:  You think you are having a reaction to a medicine you are taking.  You have headaches that keep coming back (recurring).  You feel dizzy.  You have swelling in your ankles.  You have trouble with your vision. Get help right away if:  You develop a severe headache or confusion.  You have unusual weakness or numbness.  You feel faint.  You have severe pain in your chest or abdomen.  You vomit repeatedly.  You have trouble breathing. Summary  Hypertension is when the force of blood pumping through your arteries is too strong. If this condition is not controlled, it may put you at risk for serious complications.  Your personal target blood pressure may vary depending on your medical conditions, your age, and other factors. For most  people, a normal blood pressure is less than 120/80.  Hypertension is treated with lifestyle changes, medicines, or a combination of both. Lifestyle changes include weight loss, eating a healthy, low-sodium diet, exercising more, and limiting alcohol. This information is not intended to replace advice given to you by your health care provider. Make sure you discuss any questions you have with your health care provider. Document Released: 12/09/2005 Document Revised: 11/06/2016 Document Reviewed: 11/06/2016 Elsevier Interactive Patient Education  [employer].

## 2017-06-11 NOTE — Progress Notes (Signed)
 Aaron Nelson, is a 83 y.o. male  CSN:[id number]  MRN:[id number]  DOB - 11/15/1930  CC:  Chief Complaint  Patient presents with  . Establish Care       HPI: Aaron Nelson is a 83 y.o. male with history of Hx of DM, chronic dizziness with orthostatic syncope, implanted loop recorder, myelodysplastic syndrome, BPH, and hypertension here today to establish medical care. Very pleasant elderly man, who was diagnosis of primary myelofibrosis with associated neutropenia without recurrent infections, anemia not requiring transfusions and thrombocytosis. Patient has been having hematological abnormalities from extended period of time, the earliest noted 2005. Patient underwent at least 2 bone marrow biopsy evaluations, most recent on 01/09/17 documenting the recurrent condition. Patient is here today with his son who is also my patient, for primary medical care of his diabetes currently being treated with insulin. He also needs his co-morbidities coordinated with specialists referral as he recently relocated here to live with his son. Patient claims adherence to his medications, reports no hypoglycemic episode. Lives with his son at the moment, independent with his ADL. He denies being depressed, feels safe at home. Patient has No headache, No chest pain, No abdominal pain - No Nausea, No new weakness tingling or numbness, No Cough - SOB.   No Known Allergies Past Medical History:  Diagnosis Date  . Diabetes mellitus without complication (HCC)   . Hypertension   . Leukemia (HCC)    Current Outpatient Prescriptions on File Prior to Visit  Medication Sig Dispense Refill  . clotrimazole (LOTRIMIN) 1 % cream Apply to affected area 2 times daily 15 g 0  . insulin glargine (LANTUS) 100 UNIT/ML injection Inject 30 Units into the skin daily.    . meclizine (ANTIVERT) 12.5 MG tablet Take 12.5 mg by mouth every evening.     No current facility-administered medications on file prior to visit.    No family  history on file. Social History   Social History  . Marital status: Divorced    Spouse name: N/A  . Number of children: N/A  . Years of education: N/A   Occupational History  . Not on file.   Social History Main Topics  . Smoking status: Never Smoker  . Smokeless tobacco: Never Used  . Alcohol use No  . Drug use: Unknown  . Sexual activity: Not on file   Other Topics Concern  . Not on file   Social History Narrative  . No narrative on file    Review of Systems: Constitutional: Negative for fever, chills, diaphoresis, activity change, appetite change and fatigue. HENT: Negative for ear pain, nosebleeds, congestion, facial swelling, rhinorrhea, neck pain, neck stiffness and ear discharge.  Eyes: Negative for pain, discharge, redness, itching and visual disturbance. Respiratory: Negative for cough, choking, chest tightness, shortness of breath, wheezing and stridor.  Cardiovascular: Negative for chest pain, palpitations and leg swelling. Gastrointestinal: Negative for abdominal distention. Genitourinary: Negative for dysuria, urgency, frequency, hematuria, flank pain, decreased urine volume, difficulty urinating and dyspareunia.  Musculoskeletal: Negative for back pain, joint swelling, arthralgia and gait problem. Neurological: Negative for dizziness, tremors, seizures, syncope, facial asymmetry, speech difficulty, weakness, light-headedness, numbness and headaches.  Hematological: Negative for adenopathy. Does not bruise/bleed easily. Psychiatric/Behavioral: Negative for hallucinations, behavioral problems, confusion, dysphoric mood, decreased concentration and agitation.    Objective:   Vitals:   06/11/17 1130  BP: (!) 157/66  Pulse: 69  Resp: 18  Temp: 98.5 F (36.9 C)    Physical Exam: Constitutional: Patient appears well-developed   and well-nourished. No distress. HENT: Normocephalic, atraumatic, External right and left ear normal. Oropharynx is clear and moist.    Eyes: Conjunctivae and EOM are normal. PERRLA, no scleral icterus. Neck: Normal ROM. Neck supple. No JVD. No tracheal deviation. No thyromegaly. CVS: RRR, S1/S2 +, no murmurs, no gallops, no carotid bruit.  Pulmonary: Effort and breath sounds normal, no stridor, rhonchi, wheezes, rales.  Abdominal: Soft. BS +, no distension, tenderness, rebound or guarding.  Musculoskeletal: Normal range of motion. No edema and no tenderness.  Lymphadenopathy: No lymphadenopathy noted, cervical, inguinal or axillary Neuro: Alert. Normal reflexes, muscle tone coordination. No cranial nerve deficit. Skin: Skin is warm and dry. No rash noted. Not diaphoretic. No erythema. No pallor. Psychiatric: Normal mood and affect. Behavior, judgment, thought content normal.  Lab Results  Component Value Date   WBC 2.4 (L) 06/05/2017   HGB 8.5 (L) 06/05/2017   HCT 27.6 (L) 06/05/2017   MCV 72.1 (L) 06/05/2017   PLT 327 06/05/2017   Lab Results  Component Value Date   CREATININE 1.33 (H) 06/05/2017   BUN 17 06/05/2017   NA 127 (L) 06/05/2017   K 4.2 06/05/2017   CL 96 (L) 06/05/2017   CO2 23 06/05/2017    No results found for: HGBA1C Lipid Panel  No results found for: CHOL, TRIG, HDL, CHOLHDL, VLDL, LDLCALC      Assessment and plan:   1. Type 2 diabetes mellitus without complication, unspecified whether long term insulin use (HCC)  - Glucose (CBG) - CBC with Differential/Platelet - CMP14+EGFR  2. Sick sinus syndrome (HCC)  - Ambulatory referral to Cardiology  3. Pacemaker  - Ambulatory referral to Cardiology  4. Leukemia in remission, unspecified leukemia type (HCC)  - Ambulatory referral to Oncology  5. Benign prostatic hyperplasia with lower urinary tract symptoms, symptom details unspecified  - Urinalysis, Complete  Return in about 4 weeks (around 07/09/2017), or Blood Sugar and BP management.  The patient was given clear instructions to go to ER or return to medical center if symptoms  don't improve, worsen or new problems develop. The patient verbalized understanding. The patient was told to call to get lab results if they haven't heard anything in the next week.     This note has been created with Dragon speech recognition software and smart phrase technology. Any transcriptional errors are unintentional.    , , MD, MHA, FACP, FAAP, CPE [facility] [place], [place] [phone]   06/11/2017, 12:06 PM 

## 2017-06-12 LAB — LIPID PANEL
CHOLESTEROL TOTAL: 157 mg/dL (ref 100–199)
Chol/HDL Ratio: 2.7 ratio (ref 0.0–5.0)
HDL: 59 mg/dL (ref >39)
LDL CALC: 73 mg/dL (ref 0–99)
TRIGLYCERIDES: 124 mg/dL (ref 0–149)
VLDL Cholesterol Cal: 25 mg/dL (ref 5–40)

## 2017-06-12 LAB — CBC WITH DIFFERENTIAL/PLATELET
Basophils Absolute: 0 10*3/uL (ref 0.0–0.2)
Basos: 2 %
EOS (ABSOLUTE): 0 10*3/uL (ref 0.0–0.4)
EOS: 2 %
HEMATOCRIT: 31.5 % — AB (ref 37.5–51.0)
HEMOGLOBIN: 9.2 g/dL — AB (ref 13.0–17.7)
LYMPHS ABS: 1 10*3/uL (ref 0.7–3.1)
Lymphs: 45 %
MCH: 22.7 pg — AB (ref 26.6–33.0)
MCHC: 29.2 g/dL — ABNORMAL LOW (ref 31.5–35.7)
MCV: 78 fL — AB (ref 79–97)
MONOCYTES: 9 %
Monocytes Absolute: 0.2 10*3/uL (ref 0.1–0.9)
NEUTROS ABS: 0.9 10*3/uL — AB (ref 1.4–7.0)
NRBC: 52 % — ABNORMAL HIGH (ref 0–0)
Neutrophils: 37 %
Platelets: 375 10*3/uL (ref 150–379)
RBC: 4.05 x10E6/uL — ABNORMAL LOW (ref 4.14–5.80)
RDW: 24.2 % — ABNORMAL HIGH (ref 12.3–15.4)
WBC: 2.3 10*3/uL — CL (ref 3.4–10.8)

## 2017-06-12 LAB — MICROSCOPIC EXAMINATION
Bacteria, UA: NONE SEEN
CASTS: NONE SEEN /LPF

## 2017-06-12 LAB — URINALYSIS, COMPLETE
Bilirubin, UA: NEGATIVE
Ketones, UA: NEGATIVE
Leukocytes, UA: NEGATIVE
NITRITE UA: NEGATIVE
PH UA: 6 (ref 5.0–7.5)
PROTEIN UA: NEGATIVE
RBC, UA: NEGATIVE
Specific Gravity, UA: >=1.03 — AB (ref 1.005–1.030)
Urobilinogen, Ur: 0.2 mg/dL (ref 0.2–1.0)

## 2017-06-12 LAB — CMP14+EGFR
A/G RATIO: 1.2 (ref 1.2–2.2)
ALBUMIN: 3.7 g/dL (ref 3.5–4.7)
ALT: 10 IU/L (ref 0–44)
AST: 28 IU/L (ref 0–40)
Alkaline Phosphatase: 131 IU/L — ABNORMAL HIGH (ref 39–117)
BILIRUBIN TOTAL: 0.5 mg/dL (ref 0.0–1.2)
BUN / CREAT RATIO: 14 (ref 10–24)
BUN: 17 mg/dL (ref 8–27)
CHLORIDE: 100 mmol/L (ref 96–106)
CO2: 22 mmol/L (ref 20–29)
Calcium: 9.2 mg/dL (ref 8.6–10.2)
Creatinine, Ser: 1.2 mg/dL (ref 0.76–1.27)
GFR calc non Af Amer: 55 mL/min/{1.73_m2} — ABNORMAL LOW (ref >59)
GFR, EST AFRICAN AMERICAN: 64 mL/min/{1.73_m2} (ref >59)
GLOBULIN, TOTAL: 3.2 g/dL (ref 1.5–4.5)
GLUCOSE: 370 mg/dL — AB (ref 65–99)
Potassium: 5.2 mmol/L (ref 3.5–5.2)
SODIUM: 137 mmol/L (ref 134–144)
TOTAL PROTEIN: 6.9 g/dL (ref 6.0–8.5)

## 2017-06-12 LAB — IMMATURE CELLS
MYELOCYTES: 2 % — AB (ref 0–0)
Metamyelocytes: 3 % — ABNORMAL HIGH (ref 0–0)

## 2017-06-18 ENCOUNTER — Telehealth: Payer: Self-pay | Admitting: *Deleted

## 2017-06-18 NOTE — Telephone Encounter (Signed)
Patient verified DOB Patients son is aware of labs being consistent with diagnosis and advised to FU with oncologist and continue monitoring blood sugar control with the new insulin dosage. No further questions at this time.

## 2017-06-18 NOTE — Telephone Encounter (Signed)
-----   Message from Tresa Garter, MD sent at 06/17/2017 10:17 AM EDT ----- Please inform patient that lab is consistent with his diagnosis, please follow up with oncologist as referred. Continue blood sugar control on current dose of insulin.

## 2017-06-20 ENCOUNTER — Ambulatory Visit (INDEPENDENT_AMBULATORY_CARE_PROVIDER_SITE_OTHER): Payer: Medicare Other | Admitting: Internal Medicine

## 2017-06-20 ENCOUNTER — Encounter: Payer: Self-pay | Admitting: Internal Medicine

## 2017-06-20 VITALS — BP 112/68 | HR 73 | Ht 65.0 in | Wt 147.8 lb

## 2017-06-20 DIAGNOSIS — Z950 Presence of cardiac pacemaker: Secondary | ICD-10-CM | POA: Diagnosis not present

## 2017-06-20 DIAGNOSIS — Z95818 Presence of other cardiac implants and grafts: Secondary | ICD-10-CM | POA: Diagnosis not present

## 2017-06-20 DIAGNOSIS — I495 Sick sinus syndrome: Secondary | ICD-10-CM

## 2017-06-20 NOTE — Progress Notes (Signed)
ELECTROPHYSIOLOGY CONSULT NOTE  Patient ID: Aaron Nelson, MRN: [id number], DOB/AGE: February 28, 1930 83 y.o. Admit date: (Not on file) Date of Consult: 06/20/2017  Primary Physician: Tresa Garter, MD Primary Cardiologist: Lucina Mellow   Aaron Nelson is being seen today for the evaluation of dizziness at the request of  Tresa Garter, MD.   HPI Aaron Nelson is a 83 y.o. male  Referred from [facility] because of dizziness and an implantable device.  He has a history of a syncopal episode associated with standing. He has had frequent problems with orthostatic dizziness and post micturition dizziness. He is a long-standing history of hypertension. He has no known cardiac disease. Because of his spells, he had an implantable loop recorder inserted in [place]  We have scanned records. He is not on antihypertensive therapy despite him being hypertensive. He has diabetes. He has no known cardiac disease. He describes he has some kind of tumor for which she was given chemotherapy 6 doses.???      Past Medical History:  Diagnosis Date  . Diabetes mellitus without complication ([place])   . Hypertension   . Leukemia [facility])       Surgical History: No past surgical history on file.   Home Meds: Prior to Admission medications   Medication Sig Start Date End Date Taking? Authorizing Provider  clotrimazole (LOTRIMIN) 1 % cream Apply to affected area 2 times daily 06/06/17  Yes Deno Etienne, DO  insulin glargine (LANTUS) 100 UNIT/ML injection Inject 30 Units into the skin daily.   Yes [provider]  meclizine (ANTIVERT) 12.5 MG tablet Take 12.5 mg by mouth every evening.   Yes [provider]    Allergies: No Known Allergies  Social History   Social History  . Marital status: Divorced    Spouse name: N/A  . Number of children: N/A  . Years of education: N/A   Occupational History  . Not on file.   Social History Main Topics  . Smoking status: Never Smoker    . Smokeless tobacco: Never Used  . Alcohol use No  . Drug use: Unknown  . Sexual activity: Not on file   Other Topics Concern  . Not on file   Social History Narrative  . No narrative on file     No family history on file.   ROS:  Please see the history of present illness.     All other systems reviewed and negative.    Physical Exam: Blood pressure 112/68, pulse 73, height 5\' 5"  (1.651 m), weight 147 lb 12.8 oz (67 kg). General: Well developed, well nourished male in no acute distress. Head: Normocephalic, atraumatic, sclera non-icteric, no xanthomas, nares are without discharge. EENT: normal  Lymph Nodes:  none Neck: Negative for carotid bruits. JVD not elevated. Back:without scoliosis kyphosis Lungs: Clear bilaterally to auscultation without wheezes, rales, or rhonchi. Breathing is unlabored. Heart: RRR with S1 S2. 2/6 systolic murmur . No rubs, or gallops appreciated. Abdomen: Soft, non-tender, non-distended with normoactive bowel sounds. No hepatomegaly. No rebound/guarding. No obvious abdominal masses. Msk:  Strength and tone appear normal for age. Extremities: No clubbing or cyanosis. No  edema.  Distal pedal pulses are 2+ and equal bilaterally. Skin: Warm and Dry Neuro: Alert and oriented X 3. CN III-XII intact Grossly normal sensory and motor function . Psych:  Responds to questions appropriately with a normal affect.      Labs: Cardiac Enzymes No results for input(s): CKTOTAL, CKMB, TROPONINI in  the last 72 hours. CBC Lab Results  Component Value Date   WBC 2.3 (LL) 06/11/2017   HGB 9.2 (L) 06/11/2017   HCT 31.5 (L) 06/11/2017   MCV 78 (L) 06/11/2017   PLT 375 06/11/2017   PROTIME: No results for input(s): LABPROT, INR in the last 72 hours. Chemistry No results for input(s): NA, K, CL, CO2, BUN, CREATININE, CALCIUM, PROT, BILITOT, ALKPHOS, ALT, AST, GLUCOSE in the last 168 hours.  Invalid input(s): LABALBU Lipids Lab Results  Component Value Date    CHOL 157 06/11/2017   HDL 59 06/11/2017   LDLCALC 73 06/11/2017   TRIG 124 06/11/2017   BNP No results found for: PROBNP Thyroid Function Tests: No results for input(s): TSH, T4TOTAL, T3FREE, THYROIDAB in the last 72 hours.  Invalid input(s): FREET3 Miscellaneous No results found for: DDIMER  Radiology/Studies:  No results found.  EKG: Sinus at 73 Interval 17/09/38 Left ventricular hypertrophy   Assessment and Plan:   Hypertension/hypertensive heart disease  Syncope  Implantable loop recorder  Orthostatic hypotension  The patient has untreated blood pressure. His symptoms are most consistent with orthostatic intolerance and likely orthostatic syncope. Objective vital signs today support that diagnosis. We have reviewed nonpharmacological measures including urinating while sitting, raising the head of the bed 6 inches and isometric leg contraction prior to standing.  In the event that these are insufficient, I would have a low threshold for the use of Mestinon. This is dosed 30--60 mg twice daily.  He is also expressed concern about his prostate. In the event that he needs pharmacological therapy to help him urinate, alpha blockers may well aggravate orthostasis significantly. Abdominal binding may be of some adjunctive nonpharmacological benefit  Loop recorder interrogation today demonstrated no arrhythmias    Virl Axe

## 2017-06-20 NOTE — Patient Instructions (Addendum)
Medication Instructions:  Your physician recommends that you continue on your current medications as directed. Please refer to the Current Medication list given to you today.  Labwork: None ordered  Testing/Procedures: None ordered  Follow-Up: Your physician recommends that you schedule a follow-up appointment in 6 weeks with Tommye Standard, PA.   Any Other Special Instructions Will Be Listed Below (If Applicable).   1.) Start urinating while sitting down.  2.) Raise head of bed 6 inches while laying down.  3.) Squeeze legs together before standing.   If you need a refill on your cardiac medications before your next appointment, please call your pharmacy.

## 2017-07-01 LAB — CUP PACEART INCLINIC DEVICE CHECK
Date Time Interrogation Session: 20180710102146
MDC IDC PG IMPLANT DT: 20180227

## 2017-07-02 ENCOUNTER — Telehealth: Payer: Self-pay | Admitting: Hematology and Oncology

## 2017-07-02 NOTE — Telephone Encounter (Signed)
Pt's son cld to schedule an appt. Appt scheduled for the pt to see Dr. Lebron Conners on 7/12 at 2pm.

## 2017-07-03 ENCOUNTER — Ambulatory Visit (HOSPITAL_BASED_OUTPATIENT_CLINIC_OR_DEPARTMENT_OTHER): Payer: Medicare Other

## 2017-07-03 ENCOUNTER — Encounter: Payer: Self-pay | Admitting: Hematology and Oncology

## 2017-07-03 ENCOUNTER — Ambulatory Visit (HOSPITAL_BASED_OUTPATIENT_CLINIC_OR_DEPARTMENT_OTHER): Payer: Medicare Other | Admitting: Hematology and Oncology

## 2017-07-03 ENCOUNTER — Telehealth: Payer: Self-pay | Admitting: Hematology and Oncology

## 2017-07-03 VITALS — BP 175/73 | HR 66 | Resp 17 | Ht 65.0 in | Wt 145.5 lb

## 2017-07-03 DIAGNOSIS — R200 Anesthesia of skin: Secondary | ICD-10-CM

## 2017-07-03 DIAGNOSIS — G8929 Other chronic pain: Secondary | ICD-10-CM | POA: Diagnosis not present

## 2017-07-03 DIAGNOSIS — D649 Anemia, unspecified: Secondary | ICD-10-CM

## 2017-07-03 DIAGNOSIS — D75839 Thrombocytosis, unspecified: Secondary | ICD-10-CM | POA: Insufficient documentation

## 2017-07-03 DIAGNOSIS — D72819 Decreased white blood cell count, unspecified: Secondary | ICD-10-CM

## 2017-07-03 DIAGNOSIS — M25552 Pain in left hip: Secondary | ICD-10-CM | POA: Diagnosis not present

## 2017-07-03 DIAGNOSIS — D473 Essential (hemorrhagic) thrombocythemia: Secondary | ICD-10-CM

## 2017-07-03 DIAGNOSIS — M25561 Pain in right knee: Secondary | ICD-10-CM

## 2017-07-03 DIAGNOSIS — M25562 Pain in left knee: Secondary | ICD-10-CM | POA: Diagnosis not present

## 2017-07-03 LAB — MANUAL DIFFERENTIAL
ALC: 1.4 10*3/uL (ref 0.9–3.3)
ANC (CHCC MAN DIFF): 0.9 10*3/uL — AB (ref 1.5–6.5)
BASOPHIL: 1 % (ref 0–2)
BLASTS: 0 % (ref 0–0)
Band Neutrophils: 6 % (ref 0–10)
EOS: 1 % (ref 0–7)
LYMPH: 49 % (ref 14–49)
METAMYELOCYTES PCT: 2 % — AB (ref 0–0)
MONO: 5 % (ref 0–14)
MYELOCYTES: 2 % — AB (ref 0–0)
Other Cell: 0 % (ref 0–0)
PLT EST: ADEQUATE
PROMYELO: 0 % (ref 0–0)
SEG: 28 % — ABNORMAL LOW (ref 38–77)
VARIANT LYMPH: 6 % — AB (ref 0–0)
nRBC: 31 % — ABNORMAL HIGH (ref 0–0)

## 2017-07-03 LAB — COMPREHENSIVE METABOLIC PANEL
ALT: 16 U/L (ref 0–55)
ANION GAP: 10 meq/L (ref 3–11)
AST: 28 U/L (ref 5–34)
Albumin: 3.8 g/dL (ref 3.5–5.0)
Alkaline Phosphatase: 155 U/L — ABNORMAL HIGH (ref 40–150)
BUN: 23.3 mg/dL (ref 7.0–26.0)
CHLORIDE: 101 meq/L (ref 98–109)
CO2: 24 meq/L (ref 22–29)
CREATININE: 1.6 mg/dL — AB (ref 0.7–1.3)
Calcium: 9.6 mg/dL (ref 8.4–10.4)
EGFR: 44 mL/min/{1.73_m2} — ABNORMAL LOW (ref >90)
Glucose: 382 mg/dl — ABNORMAL HIGH (ref 70–140)
POTASSIUM: 4.5 meq/L (ref 3.5–5.1)
Sodium: 135 mEq/L — ABNORMAL LOW (ref 136–145)
Total Bilirubin: 0.92 mg/dL (ref 0.20–1.20)
Total Protein: 8 g/dL (ref 6.4–8.3)

## 2017-07-03 LAB — CBC & DIFF AND RETIC
HCT: 30.2 % — ABNORMAL LOW (ref 38.4–49.9)
HGB: 9.5 g/dL — ABNORMAL LOW (ref 13.0–17.1)
IMMATURE RETIC FRACT: 22.7 % — AB (ref 3.00–10.60)
MCH: 22.6 pg — ABNORMAL LOW (ref 27.2–33.4)
MCHC: 31.5 g/dL — AB (ref 32.0–36.0)
MCV: 71.9 fL — ABNORMAL LOW (ref 79.3–98.0)
PLATELETS: 383 10*3/uL (ref 140–400)
RBC: 4.2 10*6/uL (ref 4.20–5.82)
RDW: 25.7 % — ABNORMAL HIGH (ref 11.0–14.6)
RETIC %: 1.65 % (ref 0.80–1.80)
RETIC CT ABS: 69.3 10*3/uL (ref 34.80–93.90)
WBC: 2.5 10*3/uL — ABNORMAL LOW (ref 4.0–10.3)

## 2017-07-03 LAB — LACTATE DEHYDROGENASE: LDH: 1430 U/L — ABNORMAL HIGH (ref 125–245)

## 2017-07-03 NOTE — Telephone Encounter (Signed)
Gave patient avs report and appointments for July.  °

## 2017-07-03 NOTE — Progress Notes (Signed)
[facility] New Visit:  Assessment: Leukopenia 83 year old male with reported diagnosis of primary myelofibrosis with hematological picture demonstrating leukopenia with both lymphopenia and neutropenia and self myelocytes in the peripheral blood, microcytic hypochromic anemia with elevated RDW and significant presence of nucleated red blood cells, and mild thrombocytosis which appears to be resolving.  While not confirmatory, the current abnormalities in the peripheral blood is certainly consistent with memory myelofibrosis or MDS. No significant change over the past several months, but we cannot exclude presence of acute leukemia in the bone marrow at this point in time. Peripherally, there is no blasts to confirm leukemic transformation.  Patient is exhibiting multiple symptoms of which the most concerning for the patient and his family is somnolence and poor energy level. It is not entirely clear what these are attributable to. No acute signs of infection. Degree of anemia is unlikely by itself to cause this degree of fatigue. Patient has had TSH checked recently and is not elevated.  The character of anemia present microcytosis and hypochromia is not typical for myelodysplastic syndrome where macrocytosis and hypochromia are more characteristic. With that in mind, I will evaluate patient for additional possible nutritional deficiencies such as iron deficiency and copper deficiency. The deficiency is may result with anemia of the described pattern. Deficiency of copper is also known to cause changes in the bone marrow consistent/similar to myelodysplasia.  At this time, I will delay obtaining bone marrow biopsy until after I'm able to review primary records from Dr. Tanna Nelson evaluation.  Plan: --Labs today as outlined below --[name] will bring their records in tomorrow for my review --RTC in 1 week for lab review and management discussion  Voice recognition software  was used and creation of this note. Despite my best effort at editing the text, some misspelling/errors may have occurred.    Orders Placed This Encounter  Procedures  . CBC & Diff and Retic    Standing Status:   Future    Number of Occurrences:   1    Standing Expiration Date:   07/03/2018  . Morphology    Standing Status:   Future    Number of Occurrences:   1    Standing Expiration Date:   07/03/2018  . Comprehensive metabolic panel    Standing Status:   Future    Number of Occurrences:   1    Standing Expiration Date:   07/03/2018  . Lactate dehydrogenase (LDH)    Standing Status:   Future    Number of Occurrences:   1    Standing Expiration Date:   07/03/2018  . Fecal, Occult Blood    Standing Status:   Future    Number of Occurrences:   1    Standing Expiration Date:   07/03/2018  . Ferritin    Standing Status:   Future    Number of Occurrences:   1    Standing Expiration Date:   07/03/2018  . Erythropoietin    Standing Status:   Future    Number of Occurrences:   1    Standing Expiration Date:   07/03/2018  . Iron and TIBC    Standing Status:   Future    Number of Occurrences:   1    Standing Expiration Date:   07/03/2018  . Folate, Serum    Standing Status:   Future    Number of Occurrences:   1    Standing Expiration Date:   07/03/2018  .  Vitamin B12    Standing Status:   Future    Number of Occurrences:   1    Standing Expiration Date:   07/03/2018  . Haptoglobin    Standing Status:   Future    Number of Occurrences:   1    Standing Expiration Date:   07/03/2018  . Heavy metals, blood    Standing Status:   Future    Number of Occurrences:   1    Standing Expiration Date:   07/03/2018    Order Specific Question:   [place] of residence?    Answer:   [place] [[id number]]  . Copper, serum    Standing Status:   Future    Number of Occurrences:   1    Standing Expiration Date:   07/03/2018  . Zinc    Standing Status:   Future    Number of Occurrences:   1    Standing  Expiration Date:   07/03/2018  . Sedimentation rate    Standing Status:   Future    Number of Occurrences:   1    Standing Expiration Date:   07/03/2018  . C-reactive protein    Standing Status:   Future    Number of Occurrences:   1    Standing Expiration Date:   07/03/2018  . ANA, IFA (with reflex)    Standing Status:   Future    Number of Occurrences:   1    Standing Expiration Date:   07/03/2018  . Direct antiglobulin test ([place])    Standing Status:   Future    Number of Occurrences:   1    Standing Expiration Date:   07/03/2018    All questions were answered. . The patient knows to call the clinic with any problems, questions or concerns.  This note was electronically signed.    History of Presenting Illness Aaron Nelson 83 y.o. presenting to the [facility] for Evaluation and management suggestions regarding myelodysplastic syndrome. It appears that patient has been previously evaluated by hematology locally by Dr Aaron Nelson including a bone marrow biopsy obtained last year. The results of evaluation are not available in the scanned documents or in [facility] panel. Reportedly, patient was diagnosed with primary myelofibrosis and treated with lenalidomide but did not tolerate medication as he developed seizures and loss of consciousness. Patient has not been on any treatment since then. He continues to have hematological abnormalities and is being referred to our center additional evaluation and management suggestions.  At the present time, patient mainly complains of fatigue and drowsiness/excessive somnolence throughout the day. Part of the history is obtained from his son as the patient may not remember all the appropriate events. Patient also complains of intermittent dizziness without nausea, lightheadedness or vomiting. He does have no shortness of breath or chest pain at rest, but is short of breath easily with ambulation with under 50 yards of walking before he needs to and  rest. It takes at least 10 minutes for him to be able to resume ambulation. Patient denies any recurrent infections, denies any sinus congestion, nasal drainage, productive cough, diarrhea, dysuria, or hematuria. He does have difficulties urinating which she attributes to dribbling of urine and incomplete bladder emptying. He reports constipation with passage of hard small pebble-like stools. He denies blood per rectum or melena. He indicates chronic pain in the left hip and new pain in bilateral knees for the past 3 months which has not been present previously. He  also complains of bilateral numbness in his hands over the past 5 years which has not changed recently. Patient used to playing guitar, but now is unable to do so due to in numbness.  Oncological/hematological History: --Labs, 01/09/17: WBC 2.2, ANC 0.5, ALC 1.3, Mono 0.3, Baso 0.0, Hgb 9.2, nRBC 16.6%, MCV 78.2, MCH 23.1, RDW 24.0, Plt 520;  --Labs, 02/12/17: WBC 2.3, ANC 0.8, ALC 1.1, Mono 0.2, Baso 0.0, Hgb 8.6, nRBC 12.8%, MCV 76.6, MCH 23.1, RDW 25.3, Plt 518;  --Labs, 05/09/17: WBC 2.4, ANC 0.9, ALC 1.2, Mono 0.2, Baso 0.1, Hgb 9.4, nRBC   0.0%, MCV 78.4, MCH 23.0, RDW 22.5, Plt 478; TSH 2.20 --Labs, 06/11/17: WBC 2.3, ANC 0.9, Myelocytes 2%, ALC 1.0, Mono 0.2, baso 0.0, Hgb 9.2, nRBC  52.0%, MCV 78.0, MCH 22.7, RDW 24.2, Plt 375;   Medical History: Past Medical History:  Diagnosis Date  . Diabetes mellitus without complication ([place])   . Hypertension   . Leukemia [facility]     Surgical History: Past Surgical History:  Procedure Laterality Date  . BONE MARROW BIOPSY  2017  . PORTACATH PLACEMENT  2017    Family History: History reviewed. No pertinent family history.  Social History: Social History   Social History  . Marital status: Divorced    Spouse name: N/A  . Number of children: N/A  . Years of education: N/A   Occupational History  . Not on file.   Social History Main Topics  . Smoking status: Never Smoker  .  Smokeless tobacco: Never Used  . Alcohol use No  . Drug use: No  . Sexual activity: Not on file   Other Topics Concern  . Not on file   Social History Narrative  . No narrative on file    Allergies: No Known Allergies  Medications:  Current Outpatient Prescriptions  Medication Sig Dispense Refill  . clotrimazole (LOTRIMIN) 1 % cream Apply to affected area 2 times daily 15 g 0  . insulin glargine (LANTUS) 100 UNIT/ML injection Inject 30 Units into the skin daily.     Current Facility-Administered Medications  Medication Dose Route Frequency Provider Last Rate Last Dose  . insulin aspart (novoLOG) injection 20 Units  20 Units Subcutaneous Once Tresa Garter, MD        Review of Systems: Review of Systems  Constitutional: Positive for fatigue. Negative for appetite change, chills, diaphoresis and fever.  HENT:   Negative for hearing loss, mouth sores, nosebleeds, sore throat, trouble swallowing and voice change.   Respiratory: Positive for shortness of breath. Negative for chest tightness, cough, hemoptysis and wheezing.   Cardiovascular: Negative for chest pain, leg swelling and palpitations.  Gastrointestinal: Positive for constipation. Negative for abdominal distention, abdominal pain, blood in stool, diarrhea, nausea, rectal pain and vomiting.  Endocrine: Negative for hot flashes.  Genitourinary: Positive for bladder incontinence and difficulty urinating. Negative for discharge, dysuria, frequency and hematuria.   Musculoskeletal: Positive for arthralgias, back pain, gait problem, neck pain and neck stiffness. Negative for flank pain and myalgias.  Skin: Negative for itching, rash and wound.  Neurological: Positive for dizziness, gait problem, light-headedness and numbness. Negative for extremity weakness, headaches and seizures.  Hematological: Negative for adenopathy. Does not bruise/bleed easily.  Psychiatric/Behavioral: The patient is nervous/anxious.       PHYSICAL EXAMINATION Blood pressure (!) 175/73, pulse 66, resp. rate 17, height _0  (1.651 m), weight 145 lb 8 oz (66 kg), SpO2 100 %.  ECOG PERFORMANCE STATUS: 3 - Symptomatic, >  50% confined to bed  Physical Exam  Constitutional: No distress.  Elderly African-American male. No acute distress. On initial presentation, blood pressure 200/70s, patient indicates anxiety, recheck blood pressure 175/73 30 minutes later.  HENT:  Head: Normocephalic and atraumatic.  Mouth/Throat: Oropharynx is clear and moist. No oropharyngeal exudate.  Eyes: Pupils are equal, round, and reactive to light. EOM are normal. No scleral icterus.  Neck: No thyromegaly present.  Cardiovascular: Normal rate, regular rhythm, S1 normal and S2 normal.  Exam reveals no gallop.   No murmur heard. Pulmonary/Chest: Effort normal and breath sounds normal. He has no decreased breath sounds. He has no wheezes. He has no rales.  Abdominal: Soft. Normal appearance. He exhibits no distension, no ascites and no mass. There is no hepatosplenomegaly. There is no tenderness.  Lymphadenopathy:       Head (right side): No submandibular and no occipital adenopathy present.       Head (left side): No submandibular and no occipital adenopathy present.    He has no cervical adenopathy.    He has no axillary adenopathy.       Right: No inguinal and no supraclavicular adenopathy present.       Left: No inguinal and no supraclavicular adenopathy present.  Neurological:  Decreased sensation in the hands, preserved sensation otherwise. Deep tendon reflexes preserved throughout. No weakness in the hands or feet.  Skin: He is not diaphoretic.     LABORATORY DATA: I have personally reviewed the data as listed: Appointment on 07/03/2017  Component Date Value Ref Range Status  . WBC 07/03/2017 2.5* 4.0 - 10.3 10e3/uL Final  . HGB 07/03/2017 9.5* 13.0 - 17.1 g/dL Final  . HCT 07/03/2017 30.2* 38.4 - 49.9 % Final  . Platelets  07/03/2017 383  140 - 400 10e3/uL Final  . MCV 07/03/2017 71.9* 79.3 - 98.0 fL Final  . MCH 07/03/2017 22.6* 27.2 - 33.4 pg Final  . MCHC 07/03/2017 31.5* 32.0 - 36.0 g/dL Final  . RBC 07/03/2017 4.20  4.20 - 5.82 10e6/uL Final  . RDW 07/03/2017 25.7* 11.0 - 14.6 % Final  . Retic % 07/03/2017 1.65  0.80 - 1.80 % Final  . Retic Ct Abs 07/03/2017 69.30  34.80 - 93.90 10e3/uL Final  . Immature Retic Fract 07/03/2017 22.70* 3.00 - 10.60 % Final  . Sodium 07/03/2017 135* 136 - 145 mEq/L Final  . Potassium 07/03/2017 4.5  3.5 - 5.1 mEq/L Final  . Chloride 07/03/2017 101  98 - 109 mEq/L Final  . CO2 07/03/2017 24  22 - 29 mEq/L Final  . Glucose 07/03/2017 382* 70 - 140 mg/dl Final   Glucose reference range is for nonfasting patients. Fasting glucose reference range is 70- 100.  Marland Kitchen BUN 07/03/2017 23.3  7.0 - 26.0 mg/dL Final  . Creatinine 07/03/2017 1.6* 0.7 - 1.3 mg/dL Final  . Total Bilirubin 07/03/2017 0.92  0.20 - 1.20 mg/dL Final  . Alkaline Phosphatase 07/03/2017 155* 40 - 150 U/L Final  . AST 07/03/2017 28  5 - 34 U/L Final  . ALT 07/03/2017 16  0 - 55 U/L Final  . Total Protein 07/03/2017 8.0  6.4 - 8.3 g/dL Final  . Albumin 07/03/2017 3.8  3.5 - 5.0 g/dL Final  . Calcium 07/03/2017 9.6  8.4 - 10.4 mg/dL Final  . Anion Gap 07/03/2017 10  3 - 11 mEq/L Final  . EGFR 07/03/2017 44* >90 ml/min/1.73 m2 Final   eGFR is calculated using the CKD-EPI Creatinine Equation (2009)  .  LDH 07/03/2017 1,430* 125 - 245 U/L Final  . ANC ([facility] manual diff) 07/03/2017 0.9* 1.5 - 6.5 10e3/uL Final  . ALC 07/03/2017 1.4  0.9 - 3.3 10e3/uL Final  . SEG 07/03/2017 28* 38 - 77 % Final  . Band Neutrophils 07/03/2017 6  0 - 10 % Final  . LYMPH 07/03/2017 49  14 - 49 % Final  . MONO 07/03/2017 5  0 - 14 % Final  . EOS 07/03/2017 1  0 - 7 % Final  . Basophil 07/03/2017 1  0 - 2 % Final  . Metamyelocytes 07/03/2017 2* 0 - 0 % Final  . Myelocytes 07/03/2017 2* 0 - 0 % Final  . PROMYELO 07/03/2017 0  0 - 0 %  Final  . Blasts 07/03/2017 0  0 - 0 % Final  . Variant Lymph 07/03/2017 6* 0 - 0 % Final  . Other Cell 07/03/2017 0  0 - 0 % Final  . nRBC 07/03/2017 31* 0 - 0 % Final  . Polychromasia 07/03/2017 Slight  Slight Final  . Tear Drop Cells 07/03/2017 Few  Negative Final  . Ovalocytes 07/03/2017 Moderate  Negative Final  . Shistocytes 07/03/2017 Few  Negative Final  . Target Cells 07/03/2017 Few  Negative Final  . Helmet Cell 07/03/2017 Few  Negative Final  . PLT EST 07/03/2017 Adequate  Adequate Final  . Platelet Morphology 07/03/2017 Giant Platelets  Within Normal Limits Final        Ardath Sax, MD

## 2017-07-03 NOTE — Assessment & Plan Note (Signed)
83 year old male with reported diagnosis of primary myelofibrosis with hematological picture demonstrating leukopenia with both lymphopenia and neutropenia and self myelocytes in the peripheral blood, microcytic hypochromic anemia with elevated RDW and significant presence of nucleated red blood cells, and mild thrombocytosis which appears to be resolving.  While not confirmatory, the current abnormalities in the peripheral blood is certainly consistent with memory myelofibrosis or MDS. No significant change over the past several months, but we cannot exclude presence of acute leukemia in the bone marrow at this point in time. Peripherally, there is no blasts to confirm leukemic transformation.  Patient is exhibiting multiple symptoms of which the most concerning for the patient and his family is somnolence and poor energy level. It is not entirely clear what these are attributable to. No acute signs of infection. Degree of anemia is unlikely by itself to cause this degree of fatigue. Patient has had TSH checked recently and is not elevated.  The character of anemia present microcytosis and hypochromia is not typical for myelodysplastic syndrome where macrocytosis and hypochromia are more characteristic. With that in mind, I will evaluate patient for additional possible nutritional deficiencies such as iron deficiency and copper deficiency. The deficiency is may result with anemia of the described pattern. Deficiency of copper is also known to cause changes in the bone marrow consistent/similar to myelodysplasia.  At this time, I will delay obtaining bone marrow biopsy until after I'm able to review primary records from Dr. Tanna Furry evaluation.  Plan: --Labs today as outlined below --[name] will bring their records in tomorrow for my review --RTC in 1 week for lab review and management discussion  Voice recognition software was used and creation of this note. Despite my best effort at editing  the text, some misspelling/errors may have occurred.

## 2017-07-04 LAB — FOLATE: Folate: 13.9 ng/mL (ref >3.0)

## 2017-07-04 LAB — IRON AND TIBC
%SAT: 27 % (ref 20–55)
IRON: 73 ug/dL (ref 42–163)
TIBC: 273 ug/dL (ref 202–409)
UIBC: 200 ug/dL (ref 117–376)

## 2017-07-04 LAB — C-REACTIVE PROTEIN: CRP: 3.9 mg/L (ref 0.0–4.9)

## 2017-07-04 LAB — HEAVY METALS, BLOOD
Arsenic, Blood: 9 ug/L (ref 2–23)
LEAD, BLOOD: 3 ug/dL (ref 0–19)
MERCURY: NOT DETECTED ug/L (ref 0.0–14.9)

## 2017-07-04 LAB — ERYTHROPOIETIN: ERYTHROPOIETIN: 28.6 m[IU]/mL — AB (ref 2.6–18.5)

## 2017-07-04 LAB — SEDIMENTATION RATE: SED RATE: 43 mm/h — AB (ref 0–30)

## 2017-07-04 LAB — FERRITIN: FERRITIN: 243 ng/mL (ref 22–316)

## 2017-07-04 LAB — HAPTOGLOBIN

## 2017-07-04 LAB — DIRECT ANTIGLOBULIN TEST (NOT AT ARMC): COOMBS', DIRECT: NEGATIVE

## 2017-07-04 LAB — VITAMIN B12: Vitamin B12: 794 pg/mL (ref 232–1245)

## 2017-07-04 NOTE — Progress Notes (Signed)
BP of 173/75 okay per Dr. Lebron Conners. Pt anxious during appt. Typical systolic 567'O/141'C. Pt to call if any other issues.

## 2017-07-05 LAB — COPPER, SERUM: Copper: 119 ug/dL (ref 72–166)

## 2017-07-05 LAB — ANTINUCLEAR ANTIBODIES, IFA: ANA Titer 1: NEGATIVE

## 2017-07-05 LAB — ZINC: Zinc, Plasma or Serum: 66 ug/dL (ref 56–134)

## 2017-07-10 ENCOUNTER — Other Ambulatory Visit: Payer: Self-pay

## 2017-07-10 ENCOUNTER — Ambulatory Visit (HOSPITAL_BASED_OUTPATIENT_CLINIC_OR_DEPARTMENT_OTHER): Payer: Medicare Other | Admitting: Hematology and Oncology

## 2017-07-10 ENCOUNTER — Encounter (HOSPITAL_COMMUNITY): Payer: Self-pay | Admitting: Emergency Medicine

## 2017-07-10 ENCOUNTER — Telehealth: Payer: Self-pay | Admitting: *Deleted

## 2017-07-10 ENCOUNTER — Emergency Department (HOSPITAL_COMMUNITY)
Admission: EM | Admit: 2017-07-10 | Discharge: 2017-07-10 | Disposition: A | Discharge status: Home / Self Care | Payer: Medicare Other | Attending: Emergency Medicine | Admitting: Emergency Medicine

## 2017-07-10 ENCOUNTER — Emergency Department (HOSPITAL_COMMUNITY): Payer: Medicare Other

## 2017-07-10 ENCOUNTER — Encounter: Payer: Self-pay | Admitting: Hematology and Oncology

## 2017-07-10 VITALS — BP 212/100 | HR 74 | Temp 97.9°F | Resp 18 | Ht 65.0 in | Wt 143.8 lb

## 2017-07-10 DIAGNOSIS — E1165 Type 2 diabetes mellitus with hyperglycemia: Secondary | ICD-10-CM | POA: Insufficient documentation

## 2017-07-10 DIAGNOSIS — Z79899 Other long term (current) drug therapy: Secondary | ICD-10-CM | POA: Diagnosis not present

## 2017-07-10 DIAGNOSIS — I10 Essential (primary) hypertension: Secondary | ICD-10-CM

## 2017-07-10 DIAGNOSIS — D473 Essential (hemorrhagic) thrombocythemia: Secondary | ICD-10-CM

## 2017-07-10 DIAGNOSIS — Z794 Long term (current) use of insulin: Secondary | ICD-10-CM | POA: Insufficient documentation

## 2017-07-10 DIAGNOSIS — D72819 Decreased white blood cell count, unspecified: Secondary | ICD-10-CM

## 2017-07-10 DIAGNOSIS — D75839 Thrombocytosis, unspecified: Secondary | ICD-10-CM

## 2017-07-10 DIAGNOSIS — R42 Dizziness and giddiness: Secondary | ICD-10-CM | POA: Diagnosis present

## 2017-07-10 DIAGNOSIS — D649 Anemia, unspecified: Secondary | ICD-10-CM

## 2017-07-10 DIAGNOSIS — C9590 Leukemia, unspecified not having achieved remission: Secondary | ICD-10-CM | POA: Diagnosis not present

## 2017-07-10 LAB — I-STAT CG4 LACTIC ACID, ED: LACTIC ACID, VENOUS: 2.1 mmol/L — AB (ref 0.5–1.9)

## 2017-07-10 LAB — CBC
HCT: 31.5 % — ABNORMAL LOW (ref 39.0–52.0)
HEMOGLOBIN: 9.9 g/dL — AB (ref 13.0–17.0)
MCH: 22.5 pg — AB (ref 26.0–34.0)
MCHC: 31.4 g/dL (ref 30.0–36.0)
MCV: 71.6 fL — ABNORMAL LOW (ref 78.0–100.0)
Platelets: 319 10*3/uL (ref 150–400)
RBC: 4.4 MIL/uL (ref 4.22–5.81)
RDW: 25.6 % — ABNORMAL HIGH (ref 11.5–15.5)
WBC: 3.1 10*3/uL — ABNORMAL LOW (ref 4.0–10.5)

## 2017-07-10 LAB — URINALYSIS, ROUTINE W REFLEX MICROSCOPIC
BACTERIA UA: NONE SEEN
Bilirubin Urine: NEGATIVE
Glucose, UA: >=500 mg/dL — AB
Hgb urine dipstick: NEGATIVE
Ketones, ur: NEGATIVE mg/dL
Leukocytes, UA: NEGATIVE
Nitrite: NEGATIVE
Protein, ur: NEGATIVE mg/dL
SPECIFIC GRAVITY, URINE: 1.025 (ref 1.005–1.030)
Squamous Epithelial / LPF: NONE SEEN
pH: 7 (ref 5.0–8.0)

## 2017-07-10 LAB — BASIC METABOLIC PANEL
ANION GAP: 8 (ref 5–15)
BUN: 15 mg/dL (ref 6–20)
CALCIUM: 9.3 mg/dL (ref 8.9–10.3)
CO2: 25 mmol/L (ref 22–32)
Chloride: 97 mmol/L — ABNORMAL LOW (ref 101–111)
Creatinine, Ser: 1.19 mg/dL (ref 0.61–1.24)
GFR calc Af Amer: >60 mL/min (ref >60)
GFR calc non Af Amer: 54 mL/min — ABNORMAL LOW (ref >60)
GLUCOSE: 544 mg/dL — AB (ref 65–99)
Potassium: 5.3 mmol/L — ABNORMAL HIGH (ref 3.5–5.1)
Sodium: 130 mmol/L — ABNORMAL LOW (ref 135–145)

## 2017-07-10 LAB — CBG MONITORING, ED
GLUCOSE-CAPILLARY: 562 mg/dL — AB (ref 65–99)
GLUCOSE-CAPILLARY: 328 mg/dL — AB (ref 65–99)

## 2017-07-10 MED ORDER — AMLODIPINE BESYLATE 2.5 MG PO TABS
2.5000 mg | ORAL_TABLET | Freq: Every day | ORAL | 0 refills | Status: DC
Start: 2017-07-10 — End: 2017-07-23

## 2017-07-10 MED ORDER — SODIUM CHLORIDE 0.9 % IV BOLUS (SEPSIS)
1000.0000 mL | Freq: Once | INTRAVENOUS | Status: AC
  Administered 2017-07-10: 1000 mL via INTRAVENOUS

## 2017-07-10 MED ORDER — INSULIN ASPART 100 UNIT/ML ~~LOC~~ SOLN
5.0000 [IU] | Freq: Once | SUBCUTANEOUS | Status: AC
  Administered 2017-07-10: 5 [IU] via SUBCUTANEOUS
  Filled 2017-07-10: qty 1

## 2017-07-10 MED ORDER — AMLODIPINE BESYLATE 5 MG PO TABS
2.5000 mg | ORAL_TABLET | Freq: Once | ORAL | Status: AC
  Administered 2017-07-10: 2.5 mg via ORAL
  Filled 2017-07-10: qty 1

## 2017-07-10 NOTE — Telephone Encounter (Signed)
Per Dr. Lebron Conners, documents provided to him by patient were not records from hematologist Dr. Lovena Le.  Dr. Lebron Conners needs [facility] records/records from hematologist in order to determine course of treatment.   This RN attempted to call provided number for pt/son.  No answer, no VM available.

## 2017-07-10 NOTE — ED Triage Notes (Signed)
Patient was over at [facility] and had elevated BP with dizziness, so referred to ED for further evaluation.  Patient reports been having dizziness with standing and ambulating since Monday.

## 2017-07-10 NOTE — ED Notes (Signed)
Pt aware urine sample is needed 

## 2017-07-10 NOTE — ED Provider Notes (Signed)
[facility] DEPT Provider Note   CSN: [id number] Arrival date & time: 07/10/17  1601     History   Chief Complaint Chief Complaint  Patient presents with  . Hypertension  . Dizziness    HPI Aaron Nelson is a 83 y.o. male.  83yo M w/ PMH including myelofibrosis, IDDM who p/w dizziness and hypertension. PT was seen routinely in oncology clinic today and sent here after he was noted to be severely hypertensive and was complaining of dizziness. Pt notes 1 month of intermittent dizziness which he describes as lightheadedness or feeling like he is going to pass out. It is particularly worse when standing and trying to urinate, during which time he becomes short of breath. Sitting down while urinating helps but he has difficulty initiating stream. He denies any chest pain, nausea, dysuria, hematuria, vomiting, headache, extremity weakness, abdominal pain, or diarrhea. He has been compliant with lantus and last dose was this morning. He does note intermittent blurry vision x 2 weeks.   The history is provided by the patient.  Hypertension   Dizziness    Past Medical History:  Diagnosis Date  . Diabetes mellitus without complication ([place])   . Hypertension   . Leukemia [facility])     Patient Active Problem List   Diagnosis Date Noted  . Leukopenia 07/03/2017  . Anemia 07/03/2017  . Thrombocytosis ([place]) 07/03/2017    Past Surgical History:  Procedure Laterality Date  . BONE MARROW BIOPSY  2017  . PORTACATH PLACEMENT  2017       Home Medications    Prior to Admission medications   Medication Sig Start Date End Date Taking? Authorizing Provider  insulin glargine (LANTUS) 100 UNIT/ML injection Inject 30 Units into the skin daily.   Yes [provider]  amLODipine (NORVASC) 2.5 MG tablet Take 1 tablet (2.5 mg total) by mouth daily. 07/10/17   Timmy Cleverly, Wenda Overland, MD  clotrimazole (LOTRIMIN) 1 % cream Apply to affected area 2 times daily Patient not taking: Reported  on 07/10/2017 06/06/17   Deno Etienne, DO    Family History No family history on file.  Social History Social History  Substance Use Topics  . Smoking status: Never Smoker  . Smokeless tobacco: Never Used  . Alcohol use No     Allergies   Patient has no known allergies.   Review of Systems Review of Systems  Neurological: Positive for dizziness.   All other systems reviewed and are negative except that which was mentioned in HPI   Physical Exam Updated Vital Signs BP (!) 203/91   Pulse 63   Temp 98.1 F (36.7 C) (Oral)   Resp 19   Ht '5\' 5"'  (1.651 m)   Wt 65.2 kg (143 lb 12 oz)   SpO2 98%   BMI 23.92 kg/m   Physical Exam  Constitutional: He is oriented to person, place, and time. He appears well-developed and well-nourished. No distress.  Awake, alert  HENT:  Head: Normocephalic and atraumatic.  Eyes: Pupils are equal, round, and reactive to light. Conjunctivae and EOM are normal.  Neck: Neck supple.  Cardiovascular: Normal rate, regular rhythm and normal heart sounds.   No murmur heard. Pulmonary/Chest: Effort normal and breath sounds normal. No respiratory distress.  Abdominal: Soft. Bowel sounds are normal. He exhibits no distension. There is no tenderness.  Musculoskeletal: He exhibits no edema.  Neurological: He is alert and oriented to person, place, and time. He has normal reflexes. He displays normal reflexes. No sensory deficit.  He exhibits normal muscle tone.  Fluent speech, normal finger-to-nose testing, negative pronator drift 5/5 strength and normal sensation x all 4 extremities Left eye lateral visual field blurriness, remainder of CN II-XII intact  Skin: Skin is warm and dry.  Psychiatric: He has a normal mood and affect. Judgment and thought content normal.  Nursing note and vitals reviewed.    ED Treatments / Results  Labs (all labs ordered are listed, but only abnormal results are displayed) Labs Reviewed  BASIC METABOLIC PANEL - Abnormal;  Notable for the following:       Result Value   Sodium 130 (*)    Potassium 5.3 (*)    Chloride 97 (*)    Glucose, Bld 544 (*)    GFR calc non Af Amer 54 (*)    All other components within normal limits  CBC - Abnormal; Notable for the following:    WBC 3.1 (*)    Hemoglobin 9.9 (*)    HCT 31.5 (*)    MCV 71.6 (*)    MCH 22.5 (*)    RDW 25.6 (*)    All other components within normal limits  URINALYSIS, ROUTINE W REFLEX MICROSCOPIC - Abnormal; Notable for the following:    Color, Urine STRAW (*)    Glucose, UA >=500 (*)    All other components within normal limits  CBG MONITORING, ED - Abnormal; Notable for the following:    Glucose-Capillary 562 (*)    All other components within normal limits  I-STAT CG4 LACTIC ACID, ED - Abnormal; Notable for the following:    Lactic Acid, Venous 2.10 (*)    All other components within normal limits  CBG MONITORING, ED - Abnormal; Notable for the following:    Glucose-Capillary 328 (*)    All other components within normal limits    EKG  EKG Interpretation  Date/Time:  Thursday July 10 2017 16:22:29 EDT Ventricular Rate:  62 PR Interval:    QRS Duration: 86 QT Interval:  412 QTC Calculation: 419 R Axis:   79 Text Interpretation:  Sinus rhythm Borderline T wave abnormalities Baseline wander in lead(s) I No previous ECGs available LVH Confirmed by Theotis Burrow [phone]) on 07/10/2017 7:10:08 PM       Radiology Ct Head Wo Contrast  Result Date: 07/10/2017 CLINICAL DATA:  Patient was over at [facility] and had elevated BP with dizziness, so referred to ED for further evaluation. Patient reports been having dizziness with standing and ambulating since Monday. Leukemia. EXAM: CT HEAD WITHOUT CONTRAST TECHNIQUE: Contiguous axial images were obtained from the base of the skull through the vertex without intravenous contrast. COMPARISON:  None. FINDINGS: Brain: Mild generalized age related parenchymal atrophy with commensurate dilatation  of the ventricles and sulci. Mild chronic small vessel ischemic changes within the deep periventricular white matter. Low-density area within the right frontal lobe white matter is most suggestive of an old infarct with some associated encephalomalacia. No masslike lesion, hemorrhage, edema or other evidence of acute parenchymal abnormality. No mass effect or midline shift. No extra-axial hemorrhage. Vascular: There are chronic calcified atherosclerotic changes of the large vessels at the skull base. No unexpected hyperdense vessel. Skull: Normal. Negative for fracture or focal lesion. Sinuses/Orbits: No acute finding. Other: None. IMPRESSION: 1. Chronic ischemic changes, as detailed above. 2. No acute intracranial abnormality. No intracranial hemorrhage or edema. No evidence of intracranial metastasis. Electronically Signed   By: Franki Cabot M.D.   On: 07/10/2017 18:03    Procedures Procedures (  including critical care time)  Medications Ordered in ED Medications  sodium chloride 0.9 % bolus 1,000 mL (0 mLs Intravenous Stopped 07/10/17 1904)  sodium chloride 0.9 % bolus 1,000 mL (0 mLs Intravenous Stopped 07/10/17 1904)  insulin aspart (novoLOG) injection 5 Units (5 Units Subcutaneous Given 07/10/17 1800)  amLODipine (NORVASC) tablet 2.5 mg (2.5 mg Oral Given 07/10/17 2216)     Initial Impression / Assessment and Plan / ED Course  I have reviewed the triage vital signs and the nursing notes.  Pertinent labs & imaging results that were available during my care of the patient were reviewed by me and considered in my medical decision making (see chart for details).     PT sent from oncology clinic for hypertension and complaints of dizziness. Here, BP was 197/92. He had some blurry vision in left eye lateral visual field but remainder of neuro exam intact. Because of visual complaints, obtained head CT as well as above labs. CBG 562, gave IVF bolus, novolog.  I reviewed his chart which shows recent  cardiology note documenting some of the same symptoms of which he is complaining today, including dizziness/lightheadedness and problems with orthostasis.   Labs show no evidence of DKA, Cr 1.19, WBC 3.1, UA with no ketones or evidence of infection. Blood glucose improved at 328. Head CT negative acute.  The patient remained hypertensive in the ED although asymptomatic otherwise with no signs of hypertensive emergency. I discussed his case with hospitalist regarding antihypertensive med options and glucose control. Dr. Hal Hope agreed with my plan to increase lantus to 35u daily and began amlodipine 2.76m daily. Pt instructed to contact PCP office to f/u for these issues. I extensively reviewed preterm precautions with the patient and his son. They voiced understanding and patient was discharged in satisfactory condition. Final Clinical Impressions(s) / ED Diagnoses   Final diagnoses:  Essential hypertension  Type 2 diabetes mellitus with hyperglycemia, with long-term current use of insulin ([place]    New Prescriptions Discharge Medication List as of 07/10/2017  9:51 PM    START taking these medications   Details  amLODipine (NORVASC) 2.5 MG tablet Take 1 tablet (2.5 mg total) by mouth daily., Starting Thu 07/10/2017, Print         Levie Owensby, RWenda Overland MD 07/11/17 0000

## 2017-07-10 NOTE — Progress Notes (Signed)
[facility] Follow-up Visit:  Assessment: No problem-specific Assessment & Plan notes found for this encounter.   No orders of the defined types were placed in this encounter.   Cancer Staging No matching staging information was found for the patient.  All questions were answered.  . The patient knows to call the clinic with any problems, questions or concerns.  This note was electronically signed.    History of Presenting Illness Aaron Nelson 83 y.o. presenting to the [facility] for regarding his previous diagnosis of myelofibrosis. Family did not contain any additional information to act previous workup done by Dr. Lovena Le last year including the results of the bone marrow biopsy.  During the visit, patient has complained of worsening dizziness and lightheadedness, his vitals demonstrated severely elevated blood pressure with systolic of 144 and diastolic of over 818. Due to symptoms and the degree of blood pressure elevation, this visit was terminated quickly and patient was transferred to emergency room for additional evaluation and treatment.  Oncological/hematological History:  No history exists.    Medical History: Past Medical History:  Diagnosis Date  . Diabetes mellitus without complication ([place])   . Hypertension   . Leukemia [facility])     Surgical History: Past Surgical History:  Procedure Laterality Date  . BONE MARROW BIOPSY  2017  . PORTACATH PLACEMENT  2017    Family History: History reviewed. No pertinent family history.  Social History: Social History   Social History  . Marital status: Divorced    Spouse name: N/A  . Number of children: N/A  . Years of education: N/A   Occupational History  . Not on file.   Social History Main Topics  . Smoking status: Never Smoker  . Smokeless tobacco: Never Used  . Alcohol use No  . Drug use: No  . Sexual activity: Not on file   Other Topics Concern  . Not on file   Social  History Narrative  . No narrative on file    Allergies: No Known Allergies  Medications:  Current Outpatient Prescriptions  Medication Sig Dispense Refill  . clotrimazole (LOTRIMIN) 1 % cream Apply to affected area 2 times daily 15 g 0  . insulin glargine (LANTUS) 100 UNIT/ML injection Inject 30 Units into the skin daily.     Current Facility-Administered Medications  Medication Dose Route Frequency Provider Last Rate Last Dose  . insulin aspart (novoLOG) injection 20 Units  20 Units Subcutaneous Once Tresa Garter, MD        Review of Systems: Review of Systems  Constitutional: Positive for fatigue.  Neurological: Positive for dizziness and light-headedness. Negative for headaches, seizures and speech difficulty.     PHYSICAL EXAMINATION Blood pressure (!) 212/100, pulse 74, temperature 97.9 F (36.6 C), temperature source Oral, resp. rate 18, height '5\' 5"'  (1.651 m), weight 143 lb 12.8 oz (65.2 kg), SpO2 100 %.  ECOG PERFORMANCE STATUS: 3 - Symptomatic, >50% confined to bed  Physical Exam  Deferred  LABORATORY DATA: I have personally reviewed the data as listed: No visits with results within 1 Week(s) from this visit.  Latest known visit with results is:  Appointment on 07/03/2017  Component Date Value Ref Range Status  . WBC 07/03/2017 2.5* 4.0 - 10.3 10e3/uL Final  . HGB 07/03/2017 9.5* 13.0 - 17.1 g/dL Final  . HCT 07/03/2017 30.2* 38.4 - 49.9 % Final  . Platelets 07/03/2017 383  140 - 400 10e3/uL Final  . MCV 07/03/2017 71.9* 79.3 -  98.0 fL Final  . MCH 07/03/2017 22.6* 27.2 - 33.4 pg Final  . MCHC 07/03/2017 31.5* 32.0 - 36.0 g/dL Final  . RBC 07/03/2017 4.20  4.20 - 5.82 10e6/uL Final  . RDW 07/03/2017 25.7* 11.0 - 14.6 % Final  . Retic % 07/03/2017 1.65  0.80 - 1.80 % Final  . Retic Ct Abs 07/03/2017 69.30  34.80 - 93.90 10e3/uL Final  . Immature Retic Fract 07/03/2017 22.70* 3.00 - 10.60 % Final  . Sodium 07/03/2017 135* 136 - 145 mEq/L Final  .  Potassium 07/03/2017 4.5  3.5 - 5.1 mEq/L Final  . Chloride 07/03/2017 101  98 - 109 mEq/L Final  . CO2 07/03/2017 24  22 - 29 mEq/L Final  . Glucose 07/03/2017 382* 70 - 140 mg/dl Final   Glucose reference range is for nonfasting patients. Fasting glucose reference range is 70- 100.  Marland Kitchen BUN 07/03/2017 23.3  7.0 - 26.0 mg/dL Final  . Creatinine 07/03/2017 1.6* 0.7 - 1.3 mg/dL Final  . Total Bilirubin 07/03/2017 0.92  0.20 - 1.20 mg/dL Final  . Alkaline Phosphatase 07/03/2017 155* 40 - 150 U/L Final  . AST 07/03/2017 28  5 - 34 U/L Final  . ALT 07/03/2017 16  0 - 55 U/L Final  . Total Protein 07/03/2017 8.0  6.4 - 8.3 g/dL Final  . Albumin 07/03/2017 3.8  3.5 - 5.0 g/dL Final  . Calcium 07/03/2017 9.6  8.4 - 10.4 mg/dL Final  . Anion Gap 07/03/2017 10  3 - 11 mEq/L Final  . EGFR 07/03/2017 44* >90 ml/min/1.73 m2 Final   eGFR is calculated using the CKD-EPI Creatinine Equation (2009)  . LDH 07/03/2017 1,430* 125 - 245 U/L Final  . Ferritin 07/03/2017 243  22 - 316 ng/ml Final  . Erythropoietin 07/03/2017 28.6* 2.6 - 18.5 mIU/mL Final   Beckman Coulter UniCel DxI 800 Immunoassay System  . Iron 07/03/2017 73  42 - 163 ug/dL Final  . TIBC 07/03/2017 273  202 - 409 ug/dL Final  . UIBC 07/03/2017 200  117 - 376 ug/dL Final  . %SAT 07/03/2017 27  20 - 55 % Final  . Folate 07/03/2017 13.9  >3.0 ng/mL Final   Comment: A serum folate concentration of less than 3.1 ng/mL is considered to represent clinical deficiency.   . Vitamin B12 07/03/2017 794  232 - 1,245 pg/mL Final  . Coombs', Direct 07/03/2017 Negative  Negative Final  . Haptoglobin 07/03/2017 <10* 34 - 200 mg/dL Final  . Lead, Blood 07/03/2017 3  0 - 19 ug/dL Final   Comment:                                          Environmental Exposure:                                           WHO Recommendation    <20                                          Occupational Exposure:  OSHA Lead Std           40                                           BEI                    30                                                Detection Limit =  1   . Arsenic, Blood 07/03/2017 9  2 - 23 ug/L Final                                                  Detection Limit = 1  . Mercury 07/03/2017 None Detected  0.0 - 14.9 ug/L Final   Comment:                                        Environmental Exposure:  <15.0                                        Occupational Exposure:                                         BEI - Inorganic Mercury: 15.0                                                Detection Limit =  1.0   . Copper 07/03/2017 119  72 - 166 ug/dL Final                                                  Detection Limit = 5  . Zinc, Plasma or Serum 07/03/2017 66  56 - 134 ug/dL Final                                                  Detection Limit = 5  . Sedimentation Rate-Westergren 07/03/2017 43* 0 - 30 mm/hr Final  . CRP 07/03/2017 3.9  0.0 - 4.9 mg/L Final  . ANA Titer 1 07/03/2017 Negative   Final   Comment:  Negative   <1:80                                                     Borderline  1:80                                                     Positive   >1:80   . ANC ([facility] manual diff) 07/03/2017 0.9* 1.5 - 6.5 10e3/uL Final  . ALC 07/03/2017 1.4  0.9 - 3.3 10e3/uL Final  . SEG 07/03/2017 28* 38 - 77 % Final  . Band Neutrophils 07/03/2017 6  0 - 10 % Final  . LYMPH 07/03/2017 49  14 - 49 % Final  . MONO 07/03/2017 5  0 - 14 % Final  . EOS 07/03/2017 1  0 - 7 % Final  . Basophil 07/03/2017 1  0 - 2 % Final  . Metamyelocytes 07/03/2017 2* 0 - 0 % Final  . Myelocytes 07/03/2017 2* 0 - 0 % Final  . PROMYELO 07/03/2017 0  0 - 0 % Final  . Blasts 07/03/2017 0  0 - 0 % Final  . Variant Lymph 07/03/2017 6* 0 - 0 % Final  . Other Cell 07/03/2017 0  0 - 0 % Final  . nRBC 07/03/2017 31* 0 - 0 % Final  . Polychromasia 07/03/2017 Slight  Slight Final   . Tear Drop Cells 07/03/2017 Few  Negative Final  . Ovalocytes 07/03/2017 Moderate  Negative Final  . Shistocytes 07/03/2017 Few  Negative Final  . Target Cells 07/03/2017 Few  Negative Final  . Helmet Cell 07/03/2017 Few  Negative Final  . PLT EST 07/03/2017 Adequate  Adequate Final  . Platelet Morphology 07/03/2017 Giant Platelets  Within Normal Limits Final       Ardath Sax, MD

## 2017-07-10 NOTE — Discharge Instructions (Signed)
1. START TAKING AMLODIPINE 2.5MG  (1 TABLET) DAILY FOR YOUR BLOOD PRESSURE. 2. INCREASE YOUR LANTUS INSULIN TO 35 UNITS DAILY. 3. AVOID CARBOHYDRATES (BREAD, PASTA, SWEETS) AND SUGARY DRINKS. 4. CALL [facility] TO HAVE FOLLOW UP APPOINTMENT FOR YOUR BLOOD SUGAR AND BLOOD PRESSURE.

## 2017-07-10 NOTE — ED Notes (Signed)
Coming from Cancer center-states systolic BP [id number] patient is dizzy

## 2017-07-15 ENCOUNTER — Ambulatory Visit (HOSPITAL_BASED_OUTPATIENT_CLINIC_OR_DEPARTMENT_OTHER): Payer: Medicare Other | Admitting: Internal Medicine

## 2017-07-15 ENCOUNTER — Encounter (HOSPITAL_COMMUNITY): Payer: Self-pay | Admitting: Emergency Medicine

## 2017-07-15 ENCOUNTER — Encounter: Payer: Self-pay | Admitting: Internal Medicine

## 2017-07-15 ENCOUNTER — Emergency Department (HOSPITAL_COMMUNITY)
Admission: EM | Admit: 2017-07-15 | Discharge: 2017-07-16 | Disposition: A | Discharge status: Home / Self Care | Payer: Medicare Other | Attending: Physician Assistant | Admitting: Physician Assistant

## 2017-07-15 ENCOUNTER — Telehealth: Payer: Self-pay | Admitting: Hematology and Oncology

## 2017-07-15 ENCOUNTER — Emergency Department (HOSPITAL_COMMUNITY): Payer: Medicare Other

## 2017-07-15 VITALS — BP 140/60 | HR 73 | Temp 97.7°F | Resp 16 | Wt 141.8 lb

## 2017-07-15 DIAGNOSIS — Z856 Personal history of leukemia: Secondary | ICD-10-CM | POA: Diagnosis not present

## 2017-07-15 DIAGNOSIS — Z9889 Other specified postprocedural states: Secondary | ICD-10-CM | POA: Insufficient documentation

## 2017-07-15 DIAGNOSIS — E1165 Type 2 diabetes mellitus with hyperglycemia: Secondary | ICD-10-CM

## 2017-07-15 DIAGNOSIS — R269 Unspecified abnormalities of gait and mobility: Secondary | ICD-10-CM | POA: Diagnosis not present

## 2017-07-15 DIAGNOSIS — Z794 Long term (current) use of insulin: Secondary | ICD-10-CM

## 2017-07-15 DIAGNOSIS — Z79899 Other long term (current) drug therapy: Secondary | ICD-10-CM | POA: Insufficient documentation

## 2017-07-15 DIAGNOSIS — I10 Essential (primary) hypertension: Secondary | ICD-10-CM

## 2017-07-15 DIAGNOSIS — R739 Hyperglycemia, unspecified: Secondary | ICD-10-CM

## 2017-07-15 DIAGNOSIS — R42 Dizziness and giddiness: Secondary | ICD-10-CM

## 2017-07-15 DIAGNOSIS — IMO0001 Reserved for inherently not codable concepts without codable children: Secondary | ICD-10-CM | POA: Insufficient documentation

## 2017-07-15 DIAGNOSIS — R0602 Shortness of breath: Secondary | ICD-10-CM | POA: Diagnosis not present

## 2017-07-15 LAB — URINALYSIS, ROUTINE W REFLEX MICROSCOPIC
Bilirubin Urine: NEGATIVE
HGB URINE DIPSTICK: NEGATIVE
KETONES UR: NEGATIVE mg/dL
Nitrite: NEGATIVE
PROTEIN: NEGATIVE mg/dL
Specific Gravity, Urine: 1.018 (ref 1.005–1.030)
pH: 5 (ref 5.0–8.0)

## 2017-07-15 LAB — POCT URINALYSIS DIPSTICK
Bilirubin, UA: NEGATIVE
Blood, UA: NEGATIVE
KETONES UA: NEGATIVE
Leukocytes, UA: NEGATIVE
Nitrite, UA: NEGATIVE
Urobilinogen, UA: 0.2 E.U./dL
pH, UA: 6 (ref 5.0–8.0)

## 2017-07-15 LAB — POCT GLYCOSYLATED HEMOGLOBIN (HGB A1C): Hemoglobin A1C: 14.6

## 2017-07-15 LAB — CBG MONITORING, ED: Glucose-Capillary: 298 mg/dL — ABNORMAL HIGH (ref 65–99)

## 2017-07-15 LAB — GLUCOSE, POCT (MANUAL RESULT ENTRY)
POC Glucose: 484 mg/dl — AB (ref 70–99)
POC Glucose: 509 mg/dl — AB (ref 70–99)
POC Glucose: 551 mg/dl — AB (ref 70–99)

## 2017-07-15 MED ORDER — INSULIN LISPRO 100 UNIT/ML (KWIKPEN): PEN_INJECTOR | SUBCUTANEOUS | 11 refills | Status: DC

## 2017-07-15 MED ORDER — INSULIN ASPART 100 UNIT/ML ~~LOC~~ SOLN
20.0000 [IU] | Freq: Once | SUBCUTANEOUS | Status: AC
  Administered 2017-07-15: 20 [IU] via SUBCUTANEOUS

## 2017-07-15 MED ORDER — INSULIN GLARGINE 100 UNIT/ML SOLOSTAR PEN: 40.0000 [IU] | PEN_INJECTOR | Freq: Every day | SUBCUTANEOUS | 99 refills | Status: DC

## 2017-07-15 MED ORDER — INSULIN ASPART 100 UNIT/ML ~~LOC~~ SOLN
5.0000 [IU] | Freq: Once | SUBCUTANEOUS | Status: AC
  Administered 2017-07-15: 5 [IU] via SUBCUTANEOUS

## 2017-07-15 NOTE — Telephone Encounter (Signed)
Scheduled appt per 7/24 los - patient is aware of appt date and time.

## 2017-07-15 NOTE — ED Provider Notes (Signed)
[place] DEPT Provider Note   CSN: [id number] Arrival date & time: 07/15/17  1731     History   Chief Complaint Chief Complaint  Patient presents with  . Shortness of Breath    sent by MD    HPI Aaron Nelson is a 83 y.o. male.  HPI   Patient is a 83 year old male brought to the emergency department by his son. Patient went to an appointment at his primary care's office today. This is because he was feeling unsteady. Patient's PCP noted his blood sugar to be 500. They were able to treat him in office with his home dose of insulin. It is noted still to be elevated at 500 so sent him here in the emergency department. Emergency department and normalized to 262. Patient's PCP had already made several changes to his insulin regimen in response to the elevated blood sugars.  Patient has concerned because he feels like his dad is having trouble doing the medications by itself. We will contact case manager, have home health initiated. And we will type up instructions for patient's son to administer insulin if needed.  Past Medical History:  Diagnosis Date  . Diabetes mellitus without complication ([place])   . Hypertension   . Leukemia [facility])     Patient Active Problem List   Diagnosis Date Noted  . Essential hypertension 07/15/2017  . Gait disturbance 07/15/2017  . Uncontrolled type 2 diabetes mellitus without complication, with long-term current use of insulin ([place]) 07/15/2017  . Leukopenia 07/03/2017  . Anemia 07/03/2017  . Thrombocytosis ([place]) 07/03/2017    Past Surgical History:  Procedure Laterality Date  . BONE MARROW BIOPSY  2017  . PORTACATH PLACEMENT  2017       Home Medications    Prior to Admission medications   Medication Sig Start Date End Date Taking? Authorizing Provider  amLODipine (NORVASC) 2.5 MG tablet Take 1 tablet (2.5 mg total) by mouth daily. 07/10/17   Little, Wenda Overland, MD  clotrimazole (LOTRIMIN) 1 % cream Apply to affected area 2 times  daily Patient not taking: Reported on 07/10/2017 06/06/17   Deno Etienne, DO  Insulin Glargine (LANTUS SOLOSTAR) 100 UNIT/ML Solostar Pen Inject 40 Units into the skin daily at 10 pm. 07/15/17   Ladell Pier, MD  insulin glargine (LANTUS) 100 UNIT/ML injection Inject 30 Units into the skin daily.    [provider]  insulin lispro (HUMALOG KWIKPEN) 100 UNIT/ML KiwkPen 10 units with the two largest meals of the day. 07/15/17   Ladell Pier, MD    Family History History reviewed. No pertinent family history.  Social History Social History  Substance Use Topics  . Smoking status: Never Smoker  . Smokeless tobacco: Never Used  . Alcohol use No     Allergies   Patient has no known allergies.   Review of Systems Review of Systems  Constitutional: Negative for activity change.  Respiratory: Negative for shortness of breath.   Cardiovascular: Negative for chest pain.  Gastrointestinal: Negative for abdominal pain.     Physical Exam Updated Vital Signs BP (!) 180/79   Pulse 63   Temp 98.1 F (36.7 C) (Oral)   Resp 16   Ht '5\' 5"'  (1.651 m)   Wt 63.5 kg (140 lb)   SpO2 100%   BMI 23.30 kg/m   Physical Exam  Constitutional: He is oriented to person, place, and time. He appears well-nourished.  HENT:  Head: Normocephalic.  Eyes: Conjunctivae are normal.  Cardiovascular: Normal  rate and regular rhythm.   Pulmonary/Chest: Effort normal and breath sounds normal. No respiratory distress. He has no wheezes.  Abdominal: Soft. He exhibits no distension. There is no tenderness.  Neurological: He is oriented to person, place, and time.  Skin: Skin is warm and dry. He is not diaphoretic.  Psychiatric: He has a normal mood and affect. His behavior is normal.     ED Treatments / Results  Labs (all labs ordered are listed, but only abnormal results are displayed) Labs Reviewed  CBG MONITORING, ED - Abnormal; Notable for the following:       Result Value    Glucose-Capillary 298 (*)    All other components within normal limits  URINALYSIS, ROUTINE W REFLEX MICROSCOPIC  CBG MONITORING, ED    EKG  EKG Interpretation None       Radiology Dg Chest 2 View  Result Date: 07/15/2017 CLINICAL DATA:  Shortness of breath for 1 month EXAM: CHEST  2 VIEW COMPARISON:  None. FINDINGS: Cardiac shadow is within normal limits. Loop recorder is again seen and stable. The lungs are hyperinflated but clear. No bony abnormality is noted. IMPRESSION: COPD without acute abnormality. Electronically Signed   By: Inez Catalina M.D.   On: 07/15/2017 19:45    Procedures Procedures (including critical care time)  Medications Ordered in ED Medications - No data to display   Initial Impression / Assessment and Plan / ED Course  I have reviewed the triage vital signs and the nursing notes.  Pertinent labs & imaging results that were available during my care of the patient were reviewed by me and considered in my medical decision making (see chart for details).      Patient is a 83 year old male brought to the emergency department by his son. Patient went to an appointment at his primary care's office today. This is because he was feeling unsteady. Patient's PCP noted his blood sugar to be 500. They were able to treat him in office with his home dose of insulin. It is noted still to be elevated at 500 so sent him here in the emergency department. Emergency department and normalized to 262. Patient's PCP had already made several changes to his insulin regimen in response to the elevated blood sugars.  Patient had blood work which showed no evidence of DKA, baseline labs.  Patient has concerned because he feels like his dad is having trouble doing the medications by itself. We will contact case manager, have home health initiated. And we will type up instructions for patient's son to administer insulin if needed.  Final Clinical Impressions(s) / ED Diagnoses    Final diagnoses:  None    New Prescriptions New Prescriptions   No medications on file     Macarthur Critchley, MD 07/15/17 2222

## 2017-07-15 NOTE — Care Management Note (Signed)
Case Management Note  Patient Details  Name: Aaron Nelson MRN: [id number] Date of Birth: February 23, 1930  Subjective/Objective:                    Action/Plan: ED CM consulted by .Dr. Thomasene Lot. concerning recommendations for [facility] services. Pt presented to ED with SOB and weakness. PMH DM HTN..  CM met with patient and son Javin Nong [phone] at bedside, verbal consent given to discuss care in the presence of family.  Verified information with patient and son. Pt lives at home at home with son primary care giver.Patient ambulates at home without assistive devices.. Discussed the recommendation for Home Health services patient may benefit from medication management, patient and son agreeable with this disposition plan. Discussed HH services RN/PT/OT/HHA . Offered choice, provided [facility] agency list, Patient selected [facility] services.. No DME needs identified. Referral faxed via [facility]. Patient and son informed that [facility] nurse will contact them by phone 24 -48 hours post discharge. Patient denies any further questions or concerns. Updated Dr. Thomasene Lot. EDP on Pod E  Expected Discharge Date:      07/15/17            Expected Discharge Plan:  [place]  In-House Referral:     Discharge planning Services  CM Consult  Post Acute Care Choice:    Choice offered to:  Patient, Adult Children  DME Arranged:    DME Agency:     HH Arranged:  RN, PT [place] Agency:  [facility] (now [facility])  Status of Service:  Completed, signed off  If discussed at [place] of Stay Meetings, dates discussed:    Additional CommentsLaurena Slimmer, RN 07/15/2017, 10:27 PM

## 2017-07-15 NOTE — ED Notes (Signed)
Pt verbalized understanding discharge instructions and denies any further needs or questions at this time. VS stable, ambulatory and steady gait.   

## 2017-07-15 NOTE — Progress Notes (Signed)
Patient ID: Aaron Nelson, male    DOB: August 29, 1930  MRN: [id number]  CC: Hospitalization Follow-up   Subjective: Aaron Nelson is a 83 y.o. male who presents for ER follow-up. son [name] is with him. His concerns today include:  83 year old male with history of Hx of DM, chronic dizziness with orthostatic syncope, implanted loop recorder, myelodysplastic syndrome, BPH, and hypertension  Patient was sent to the emergency room 07/10/2017 from the oncologist office due to complaints of dizziness and blood pressure of 212/100 . He was also found to have elevated blood sugar in the 500s. CAT scan of the head was negative for any acute events. Labs did not reveal DKA. Patient given intravenous fluids and NovoLog and was discharged home on a low dose of Norvasc 2.5 mg. Lantus was increased from 30 to 35 units. -Since being home patient reports that his glucometer has been reading as Hi. He thinks the machine is not working correctly because of this.  He forgot to take Lantus this morning. -pt lives with son. He is alone during the day  He continues to complain of dizziness with standing and urination. -He has seen cardiologist. Note reviewed. -Son is requesting a shower chair  -Son reports that patient's balance is off when he walks with some truncal instability. This is not new.  Patient ambulates with a cane    Patient Active Problem List   Diagnosis Date Noted  . Leukopenia 07/03/2017  . Anemia 07/03/2017  . Thrombocytosis ([place]) 07/03/2017     Current Outpatient Prescriptions on File Prior to Visit  Medication Sig Dispense Refill  . amLODipine (NORVASC) 2.5 MG tablet Take 1 tablet (2.5 mg total) by mouth daily. 30 tablet 0  . clotrimazole (LOTRIMIN) 1 % cream Apply to affected area 2 times daily (Patient not taking: Reported on 07/10/2017) 15 g 0  . insulin glargine (LANTUS) 100 UNIT/ML injection Inject 30 Units into the skin daily.     Current Facility-Administered Medications on File  Prior to Visit  Medication Dose Route Frequency Provider Last Rate Last Dose  . insulin aspart (novoLOG) injection 20 Units  20 Units Subcutaneous Once Tresa Garter, MD        No Known Allergies  Social History   Social History  . Marital status: Divorced    Spouse name: N/A  . Number of children: N/A  . Years of education: N/A   Occupational History  . Not on file.   Social History Main Topics  . Smoking status: Never Smoker  . Smokeless tobacco: Never Used  . Alcohol use No  . Drug use: No  . Sexual activity: Not on file   Other Topics Concern  . Not on file   Social History Narrative  . No narrative on file    No family history on file.  Past Surgical History:  Procedure Laterality Date  . BONE MARROW BIOPSY  2017  . PORTACATH PLACEMENT  2017    ROS: Review of Systems As stated above PHYSICAL EXAM: BP 140/60   Pulse 73   Temp 97.7 F (36.5 C) (Oral)   Resp 16   Wt 141 lb 12.8 oz (64.3 kg)   SpO2 100%   BMI 23.60 kg/m    Physical Exam General appearance - pleasant elderly male in NAD Mental status - patient oriented to person place and time  Mouth -oral mucosa moist Chest -chest clear bilaterally Heart - regular rate and rhythm Neurological - ambulates with a cane. Complains of  dizziness while walking. Gait is slow Extremities -no lower extremity edema  Results for orders placed or performed in visit on 07/15/17  POCT glucose (manual entry)  Result Value Ref Range   POC Glucose 484 (A) 70 - 99 mg/dl  POCT glycosylated hemoglobin (Hb A1C)  Result Value Ref Range   Hemoglobin A1C 14.6   POCT urinalysis dipstick  Result Value Ref Range   Color, UA yellow    Clarity, UA clear    Glucose, UA >1,000    Bilirubin, UA negative    Ketones, UA negative    Spec Grav, UA <=1.005 (A) 1.010 - 1.025   Blood, UA negative    pH, UA 6.0 5.0 - 8.0   Protein, UA trace    Urobilinogen, UA 0.2 0.2 or 1.0 E.U./dL   Nitrite, UA negative     Leukocytes, UA Negative Negative  POCT glucose (manual entry)  Result Value Ref Range   POC Glucose 509 (A) 70 - 99 mg/dl  POCT glucose (manual entry)  Result Value Ref Range   POC Glucose 551 (A) 70 - 99 mg/dl   Lab Results  Component Value Date   HGBA1C 14.6 07/15/2017    ASSESSMENT AND PLAN: 1. Uncontrolled type 2 diabetes mellitus without complication, with long-term current use of insulin ([place]) -From history patient's blood sugars have continued to run high at home to the extent that it is not registering on his glucometer. -Patient given a total of 25 units of NovoLog and several glasses of water today here in the clinic and after an hour blood sugar was still greater than 500.  I advised that he be seen in the emergency room as he may need some intravenous insulin and IV fluids -Recommend increase Lantus from 35 units to 40 units daily starting tonight Add Humalog 10 units with 2 largest meals of the day which he indicated his breakfast and dinner - will have him follow-up in one week and bring blood sugar readings are glucometer in with him - POCT glucose (manual entry) - POCT glycosylated hemoglobin (Hb A1C) - Microalbumin/Creatinine Ratio, Urine - insulin aspart (novoLOG) injection 20 Units; Inject 0.2 mLs (20 Units total) into the skin once. - Insulin Glargine (LANTUS SOLOSTAR) 100 UNIT/ML Solostar Pen; Inject 40 Units into the skin daily at 10 pm.  Dispense: 5 pen; Refill: PRN - insulin lispro (HUMALOG KWIKPEN) 100 UNIT/ML KiwkPen; 10 units with the two largest meals of the day.  Dispense: 15 mL; Refill: 11 - POCT glucose (manual entry)  2. Hyperglycemia -See plan above -- insulin aspart (novoLOG) injection 20 Units; Inject 0.2 mLs (20 Units total) into the skin once. - insulin aspart (novoLOG) injection 5 Units; Inject 0.05 mLs (5 Units total) into the skin once. - POCT urinalysis dipstick - POCT glucose (manual entry)  3. Gait disturbance - Research officer, [employer]  standard  4. Essential hypertension Continue Norvasc  5. Dizziness - Shower chair - Walker standard -Advised son to consider getting home health aid. However patient has Medicare only which does not cover for [facility] services  Patient was given the opportunity to ask questions.  Patient verbalized understanding of the plan and was able to repeat key elements of the plan.   Orders Placed This Encounter  Procedures  . Shower chair  . Walker standard  . Microalbumin/Creatinine Ratio, Urine  . POCT glucose (manual entry)  . POCT glycosylated hemoglobin (Hb A1C)  . POCT urinalysis dipstick  . POCT glucose (manual entry)  .  POCT glucose (manual entry)     Requested Prescriptions   Signed Prescriptions Disp Refills  . Insulin Glargine (LANTUS SOLOSTAR) 100 UNIT/ML Solostar Pen 5 pen PRN    Sig: Inject 40 Units into the skin daily at 10 pm.  . insulin lispro (HUMALOG KWIKPEN) 100 UNIT/ML KiwkPen 15 mL 11    Sig: 10 units with the two largest meals of the day.    Return in about 1 week (around 07/22/2017) for jegede .  Karle Plumber, MD, FACP

## 2017-07-15 NOTE — Discharge Instructions (Signed)
Take LANTUS 40 units at night  Take Humalog 10 units with breakfast and dinner.   Measure blood sugars 4 times a day and record.   Please return with any concerns.

## 2017-07-15 NOTE — ED Triage Notes (Addendum)
Patient's blood pressure in triage is 137/68. He denies any dizziness. Complains of "real short of breath". Review of Notes says high blood sugar at office visit.

## 2017-07-15 NOTE — ED Notes (Signed)
CBG 298 

## 2017-07-15 NOTE — ED Notes (Signed)
Per report Wynetta Emery, MD at [facility] pt missed taking his Lantus 35 units this am, pt rcvd 20 units Novolog d/t CBG being 484, recheck CBG 551, pt then received 5 unitsl of Novolog with CBG remaining greater than 500, pt sent here for further eval, pt is a [place] pt, per Wynetta Emery, MD orders were provided to pt for med changes based on today's visit for hyperglycemia upon discharge from their facility

## 2017-07-15 NOTE — Patient Instructions (Signed)
Increase Lantus to 40 units once a day.  Start Humalog 10 units with breakfast and dinner.  Please call if blood sugars start dropping too low.

## 2017-07-16 ENCOUNTER — Telehealth: Payer: Self-pay | Admitting: *Deleted

## 2017-07-16 ENCOUNTER — Telehealth: Payer: Self-pay | Admitting: Internal Medicine

## 2017-07-16 LAB — MICROALBUMIN / CREATININE URINE RATIO
Creatinine, Urine: 71.1 mg/dL
MICROALB/CREAT RATIO: 89 mg/g{creat} — AB (ref 0.0–30.0)
Microalbumin, Urine: 63.3 ug/mL

## 2017-07-16 NOTE — Telephone Encounter (Signed)
PC placed to pt today this evening to see how BS doing. I got voicemail but did not leave a message.

## 2017-07-16 NOTE — Telephone Encounter (Signed)
Patients DOB was verified. Patients insurance will not cover Humalog but will cover Novolog.  VO to provide alternative to patient.

## 2017-07-18 DIAGNOSIS — D696 Thrombocytopenia, unspecified: Secondary | ICD-10-CM | POA: Diagnosis not present

## 2017-07-18 DIAGNOSIS — R2681 Unsteadiness on feet: Secondary | ICD-10-CM | POA: Diagnosis not present

## 2017-07-18 DIAGNOSIS — I10 Essential (primary) hypertension: Secondary | ICD-10-CM | POA: Diagnosis not present

## 2017-07-18 DIAGNOSIS — E1165 Type 2 diabetes mellitus with hyperglycemia: Secondary | ICD-10-CM | POA: Diagnosis not present

## 2017-07-18 DIAGNOSIS — D649 Anemia, unspecified: Secondary | ICD-10-CM | POA: Diagnosis not present

## 2017-07-18 DIAGNOSIS — C9590 Leukemia, unspecified not having achieved remission: Secondary | ICD-10-CM | POA: Diagnosis not present

## 2017-07-21 ENCOUNTER — Ambulatory Visit (INDEPENDENT_AMBULATORY_CARE_PROVIDER_SITE_OTHER): Payer: Medicare Other | Admitting: *Deleted

## 2017-07-21 DIAGNOSIS — I495 Sick sinus syndrome: Secondary | ICD-10-CM

## 2017-07-22 DIAGNOSIS — D649 Anemia, unspecified: Secondary | ICD-10-CM | POA: Diagnosis not present

## 2017-07-22 DIAGNOSIS — E1165 Type 2 diabetes mellitus with hyperglycemia: Secondary | ICD-10-CM | POA: Diagnosis not present

## 2017-07-22 DIAGNOSIS — I10 Essential (primary) hypertension: Secondary | ICD-10-CM | POA: Diagnosis not present

## 2017-07-22 DIAGNOSIS — C9590 Leukemia, unspecified not having achieved remission: Secondary | ICD-10-CM | POA: Diagnosis not present

## 2017-07-22 DIAGNOSIS — D696 Thrombocytopenia, unspecified: Secondary | ICD-10-CM | POA: Diagnosis not present

## 2017-07-22 DIAGNOSIS — R2681 Unsteadiness on feet: Secondary | ICD-10-CM | POA: Diagnosis not present

## 2017-07-22 NOTE — Progress Notes (Signed)
[facility] Summary Report / Loop Recorder 

## 2017-07-23 ENCOUNTER — Other Ambulatory Visit: Payer: Self-pay | Admitting: Pharmacist

## 2017-07-23 ENCOUNTER — Encounter: Payer: Self-pay | Admitting: Internal Medicine

## 2017-07-23 ENCOUNTER — Ambulatory Visit: Payer: Medicare Other | Attending: Internal Medicine | Admitting: Internal Medicine

## 2017-07-23 ENCOUNTER — Telehealth: Payer: Self-pay | Admitting: Internal Medicine

## 2017-07-23 VITALS — BP 138/74 | HR 85 | Temp 98.2°F | Resp 16 | Wt 144.2 lb

## 2017-07-23 DIAGNOSIS — Z23 Encounter for immunization: Secondary | ICD-10-CM

## 2017-07-23 DIAGNOSIS — I10 Essential (primary) hypertension: Secondary | ICD-10-CM

## 2017-07-23 DIAGNOSIS — Z0000 Encounter for general adult medical examination without abnormal findings: Secondary | ICD-10-CM

## 2017-07-23 DIAGNOSIS — C9591 Leukemia, unspecified, in remission: Secondary | ICD-10-CM | POA: Diagnosis not present

## 2017-07-23 DIAGNOSIS — IMO0001 Reserved for inherently not codable concepts without codable children: Secondary | ICD-10-CM

## 2017-07-23 DIAGNOSIS — Z794 Long term (current) use of insulin: Secondary | ICD-10-CM | POA: Diagnosis not present

## 2017-07-23 DIAGNOSIS — R42 Dizziness and giddiness: Secondary | ICD-10-CM | POA: Insufficient documentation

## 2017-07-23 DIAGNOSIS — R55 Syncope and collapse: Secondary | ICD-10-CM | POA: Diagnosis not present

## 2017-07-23 DIAGNOSIS — E1165 Type 2 diabetes mellitus with hyperglycemia: Secondary | ICD-10-CM

## 2017-07-23 DIAGNOSIS — N400 Enlarged prostate without lower urinary tract symptoms: Secondary | ICD-10-CM | POA: Insufficient documentation

## 2017-07-23 DIAGNOSIS — E119 Type 2 diabetes mellitus without complications: Secondary | ICD-10-CM | POA: Diagnosis present

## 2017-07-23 DIAGNOSIS — D469 Myelodysplastic syndrome, unspecified: Secondary | ICD-10-CM | POA: Diagnosis not present

## 2017-07-23 LAB — GLUCOSE, POCT (MANUAL RESULT ENTRY): POC Glucose: 186 mg/dl — AB (ref 70–99)

## 2017-07-23 MED ORDER — INSULIN GLARGINE 100 UNIT/ML SOLOSTAR PEN: 40.0000 [IU] | PEN_INJECTOR | Freq: Every day | SUBCUTANEOUS | 99 refills | Status: DC

## 2017-07-23 MED ORDER — INSULIN LISPRO 100 UNIT/ML (KWIKPEN): PEN_INJECTOR | SUBCUTANEOUS | 11 refills | Status: DC

## 2017-07-23 MED ORDER — INSULIN ASPART 100 UNIT/ML FLEXPEN: PEN_INJECTOR | SUBCUTANEOUS | 11 refills | Status: DC

## 2017-07-23 MED ORDER — AMLODIPINE BESYLATE 2.5 MG PO TABS: 2.5000 mg | ORAL_TABLET | Freq: Every day | ORAL | 3 refills | Status: DC

## 2017-07-23 NOTE — Patient Instructions (Addendum)
Td Vaccine (Tetanus and Diphtheria): What You Need to Know 1. Why get vaccinated? Tetanus  and diphtheria are very serious diseases. They are rare in the [place] today, but people who do become infected often have severe complications. Td vaccine is used to protect adolescents and adults from both of these diseases. Both tetanus and diphtheria are infections caused by bacteria. Diphtheria spreads from person to person through coughing or sneezing. Tetanus-causing bacteria enter the body through cuts, scratches, or wounds. TETANUS (lockjaw) causes painful muscle tightening and stiffness, usually all over the body.  It can lead to tightening of muscles in the head and neck so you can't open your mouth, swallow, or sometimes even breathe. Tetanus kills about 1 out of every 10 people who are infected even after receiving the best medical care.  DIPHTHERIA can cause a thick coating to form in the back of the throat.  It can lead to breathing problems, paralysis, heart failure, and death.  Before vaccines, as many as 200,000 cases of diphtheria and hundreds of cases of tetanus were reported in the [place] each year. Since vaccination began, reports of cases for both diseases have dropped by about 99%. 2. Td vaccine Td vaccine can protect adolescents and adults from tetanus and diphtheria. Td is usually given as a booster dose every 10 years but it can also be given earlier after a severe and dirty wound or burn. Another vaccine, called Tdap, which protects against pertussis in addition to tetanus and diphtheria, is sometimes recommended instead of Td vaccine. Your doctor or the person giving you the vaccine can give you more information. Td may safely be given at the same time as other vaccines. 3. Some people should not get this vaccine  A person who has ever had a life-threatening allergic reaction after a previous dose of any tetanus or diphtheria containing vaccine, OR has a severe  allergy to any part of this vaccine, should not get Td vaccine. Tell the person giving the vaccine about any severe allergies.  Talk to your doctor if you: ? had severe pain or swelling after any vaccine containing diphtheria or tetanus, ? ever had a condition called Guillain Barre Syndrome (GBS), ? aren't feeling well on the day the shot is scheduled. 4. What are the risks from Td vaccine? With any medicine, including vaccines, there is a chance of side effects. These are usually mild and go away on their own. Serious reactions are also possible but are rare. Most people who get Td vaccine do not have any problems with it. Mild problems following Td vaccine: (Did not interfere with activities)  Pain where the shot was given (about 8 people in 10)  Redness or swelling where the shot was given (about 1 person in 4)  Mild fever (rare)  Headache (about 1 person in 4)  Tiredness (about 1 person in 4)  Moderate problems following Td vaccine: (Interfered with activities, but did not require medical attention)  Fever over 102F (rare)  Severe problems following Td vaccine: (Unable to perform usual activities; required medical attention)  Swelling, severe pain, bleeding and/or redness in the arm where the shot was given (rare).  Problems that could happen after any vaccine:  People sometimes faint after a medical procedure, including vaccination. Sitting or lying down for about 15 minutes can help prevent fainting, and injuries caused by a fall. Tell your doctor if you feel dizzy, or have vision changes or ringing in the ears.  Some people get   severe pain in the shoulder and have difficulty moving the arm where a shot was given. This happens very rarely.  Any medication can cause a severe allergic reaction. Such reactions from a vaccine are very rare, estimated at fewer than 1 in a million doses, and would happen within a few minutes to a few hours after the vaccination. As with any  medicine, there is a very remote chance of a vaccine causing a serious injury or death. The safety of vaccines is always being monitored. For more information, visit: http://www.aguilar.org/ 5. What if there is a serious reaction? What should I look for? Look for anything that concerns you, such as signs of a severe allergic reaction, very high fever, or unusual behavior. Signs of a severe allergic reaction can include hives, swelling of the face and throat, difficulty breathing, a fast heartbeat, dizziness, and weakness. These would usually start a few minutes to a few hours after the vaccination. What should I do?  If you think it is a severe allergic reaction or other emergency that can't wait, call 9-1-1 or get the person to the nearest hospital. Otherwise, call your doctor.  Afterward, the reaction should be reported to the Vaccine Adverse Event Reporting System (VAERS). Your doctor might file this report, or you can do it yourself through the VAERS web site at www.vaers.SamedayNews.es, or by calling [phone]. ? VAERS does not give medical advice. 6. The National Vaccine Injury Compensation Program The [employer] Vaccine Injury Compensation Program (VICP) is a federal program that was created to compensate people who may have been injured by certain vaccines. Persons who believe they may have been injured by a vaccine can learn about the program and about filing a claim by calling 450 417 4321 or visiting the [place] website at GoldCloset.com.ee. There is a time limit to file a claim for compensation. 7. How can I learn more?  Ask your doctor. He or she can give you the vaccine package insert or suggest other sources of information.  Call your local or state health department.  Contact the Centers for Disease Control and Prevention (CDC): ? Call 8058385471 (1-800-CDC-INFO) ? Visit CDC's website at http://hunter.com/ CDC Td Vaccine VIS (04/02/16) This information is  not intended to replace advice given to you by your health care provider. Make sure you discuss any questions you have with your health care provider. Document Released: 10/06/2006 Document Revised: 08/29/2016 Document Reviewed: 08/29/2016 Elsevier Interactive Patient Education  2017 [employer] Pneumococcal Conjugate Vaccine (PCV13) What You Need to Know 1. Why get vaccinated? Vaccination can protect both children and adults from pneumococcal disease. Pneumococcal disease is caused by bacteria that can spread from person to person through close contact. It can cause ear infections, and it can also lead to more serious infections of the:  Lungs (pneumonia),  Blood (bacteremia), and  Covering of the brain and spinal cord (meningitis).  Pneumococcal pneumonia is most common among adults. Pneumococcal meningitis can cause deafness and brain damage, and it kills about 1 child in 10 who get it. Anyone can get pneumococcal disease, but children under 60 years of age and adults 74 years and older, people with certain medical conditions, and cigarette smokers are at the highest risk. Before there was a vaccine, the [place] saw:  more than 700 cases of meningitis,  about 13,000 blood infections,  about 5 million ear infections, and  about 200 deaths  in children under 5 each year from pneumococcal disease. Since vaccine became available, severe pneumococcal disease  in these children has fallen by 88%. About 18,000 older adults die of pneumococcal disease each year in the [place]. Treatment of pneumococcal infections with penicillin and other drugs is not as effective as it used to be, because some strains of the disease have become resistant to these drugs. This makes prevention of the disease, through vaccination, even more important. 2. PCV13 vaccine Pneumococcal conjugate vaccine (called PCV13) protects against 13 types of pneumococcal bacteria. PCV13 is routinely given to  children at 2, 4, 6, and 64-73 months of age. It is also recommended for children and adults 60 to 34 years of age with certain health conditions, and for all adults 45 years of age and older. Your doctor can give you details. 3. Some people should not get this vaccine Anyone who has ever had a life-threatening allergic reaction to a dose of this vaccine, to an earlier pneumococcal vaccine called PCV7, or to any vaccine containing diphtheria toxoid (for example, DTaP), should not get PCV13. Anyone with a severe allergy to any component of PCV13 should not get the vaccine. Tell your doctor if the person being vaccinated has any severe allergies. If the person scheduled for vaccination is not feeling well, your healthcare provider might decide to reschedule the shot on another day. 4. Risks of a vaccine reaction With any medicine, including vaccines, there is a chance of reactions. These are usually mild and go away on their own, but serious reactions are also possible. Problems reported following PCV13 varied by age and dose in the series. The most common problems reported among children were:  About half became drowsy after the shot, had a temporary loss of appetite, or had redness or tenderness where the shot was given.  About 1 out of 3 had swelling where the shot was given.  About 1 out of 3 had a mild fever, and about 1 in 20 had a fever over 102.35F.  Up to about 8 out of 10 became fussy or irritable.  Adults have reported pain, redness, and swelling where the shot was given; also mild fever, fatigue, headache, chills, or muscle pain. Young children who get PCV13 along with inactivated flu vaccine at the same time may be at increased risk for seizures caused by fever. Ask your doctor for more information. Problems that could happen after any vaccine:  People sometimes faint after a medical procedure, including vaccination. Sitting or lying down for about 15 minutes can help prevent fainting,  and injuries caused by a fall. Tell your doctor if you feel dizzy, or have vision changes or ringing in the ears.  Some older children and adults get severe pain in the shoulder and have difficulty moving the arm where a shot was given. This happens very rarely.  Any medication can cause a severe allergic reaction. Such reactions from a vaccine are very rare, estimated at about 1 in a million doses, and would happen within a few minutes to a few hours after the vaccination. As with any medicine, there is a very small chance of a vaccine causing a serious injury or death. The safety of vaccines is always being monitored. For more information, visit: http://www.aguilar.org/ 5. What if there is a serious reaction? What should I look for? Look for anything that concerns you, such as signs of a severe allergic reaction, very high fever, or unusual behavior. Signs of a severe allergic reaction can include hives, swelling of the face and throat, difficulty breathing, a fast heartbeat, dizziness, and weakness-usually  within a few minutes to a few hours after the vaccination. What should I do?  If you think it is a severe allergic reaction or other emergency that can't wait, call 9-1-1 or get the person to the nearest hospital. Otherwise, call your doctor.  Reactions should be reported to the Vaccine Adverse Event Reporting System (VAERS). Your doctor should file this report, or you can do it yourself through the VAERS web site at www.vaers.SamedayNews.es, or by calling 406-646-3275. ? VAERS does not give medical advice. 6. The National Vaccine Injury Compensation Program The [employer] Vaccine Injury Compensation Program (VICP) is a federal program that was created to compensate people who may have been injured by certain vaccines. Persons who believe they may have been injured by a vaccine can learn about the program and about filing a claim by calling (856) 486-8598 or visiting the [place] website at  GoldCloset.com.ee. There is a time limit to file a claim for compensation. 7. How can I learn more?  Ask your healthcare provider. He or she can give you the vaccine package insert or suggest other sources of information.  Call your local or state health department.  Contact the Centers for Disease Control and Prevention (CDC): ? Call (985) 146-6752 (1-800-CDC-INFO) or ? Visit CDC's website at http://hunter.com/ Vaccine Information Statement, PCV13 Vaccine (10/27/2014) This information is not intended to replace advice given to you by your health care provider. Make sure you discuss any questions you have with your health care provider. Document Released: 10/06/2006 Document Revised: 08/29/2016 Document Reviewed: 08/29/2016 Elsevier Interactive Patient Education  2017 [place]. Diabetes Mellitus and Food It is important for you to manage your blood sugar (glucose) level. Your blood glucose level can be greatly affected by what you eat. Eating healthier foods in the appropriate amounts throughout the day at about the same time each day will help you control your blood glucose level. It can also help slow or prevent worsening of your diabetes mellitus. Healthy eating may even help you improve the level of your blood pressure and reach or maintain a healthy weight. General recommendations for healthful eating and cooking habits include:  Eating meals and snacks regularly. Avoid going long periods of time without eating to lose weight.  Eating a diet that consists mainly of plant-based foods, such as fruits, vegetables, nuts, legumes, and whole grains.  Using low-heat cooking methods, such as baking, instead of high-heat cooking methods, such as deep frying.  Work with your dietitian to make sure you understand how to use the Nutrition Facts information on food labels. How can food affect me? Carbohydrates Carbohydrates affect your blood glucose level more than any other  type of food. Your dietitian will help you determine how many carbohydrates to eat at each meal and teach you how to count carbohydrates. Counting carbohydrates is important to keep your blood glucose at a healthy level, especially if you are using insulin or taking certain medicines for diabetes mellitus. Alcohol Alcohol can cause sudden decreases in blood glucose (hypoglycemia), especially if you use insulin or take certain medicines for diabetes mellitus. Hypoglycemia can be a life-threatening condition. Symptoms of hypoglycemia (sleepiness, dizziness, and disorientation) are similar to symptoms of having too much alcohol. If your health care provider has given you approval to drink alcohol, do so in moderation and use the following guidelines:  Women should not have more than one drink per day, and men should not have more than two drinks per day. One drink is equal to: ? 12 oz  of beer. ? 5 oz of wine. ? 1 oz of hard liquor.  Do not drink on an empty stomach.  Keep yourself hydrated. Have water, diet soda, or unsweetened iced tea.  Regular soda, juice, and other mixers might contain a lot of carbohydrates and should be counted.  What foods are not recommended? As you make food choices, it is important to remember that all foods are not the same. Some foods have fewer nutrients per serving than other foods, even though they might have the same number of calories or carbohydrates. It is difficult to get your body what it needs when you eat foods with fewer nutrients. Examples of foods that you should avoid that are high in calories and carbohydrates but low in nutrients include:  Trans fats (most processed foods list trans fats on the Nutrition Facts label).  Regular soda.  Juice.  Candy.  Sweets, such as cake, pie, doughnuts, and cookies.  Fried foods.  What foods can I eat? Eat nutrient-rich foods, which will nourish your body and keep you healthy. The food you should eat also  will depend on several factors, including:  The calories you need.  The medicines you take.  Your weight.  Your blood glucose level.  Your blood pressure level.  Your cholesterol level.  You should eat a variety of foods, including:  Protein. ? Lean cuts of meat. ? Proteins low in saturated fats, such as fish, egg whites, and beans. Avoid processed meats.  Fruits and vegetables. ? Fruits and vegetables that may help control blood glucose levels, such as apples, mangoes, and yams.  Dairy products. ? Choose fat-free or low-fat dairy products, such as milk, yogurt, and cheese.  Grains, bread, pasta, and rice. ? Choose whole grain products, such as multigrain bread, whole oats, and brown rice. These foods may help control blood pressure.  Fats. ? Foods containing healthful fats, such as nuts, avocado, olive oil, canola oil, and fish.  Does everyone with diabetes mellitus have the same meal plan? Because every person with diabetes mellitus is different, there is not one meal plan that works for everyone. It is very important that you meet with a dietitian who will help you create a meal plan that is just right for you. This information is not intended to replace advice given to you by your health care provider. Make sure you discuss any questions you have with your health care provider. Document Released: 09/05/2005 Document Revised: 05/16/2016 Document Reviewed: 11/05/2013 Elsevier Interactive Patient Education  2017 [place].  Blood Glucose Monitoring, Adult Monitoring your blood sugar (glucose) helps you manage your diabetes. It also helps you and your health care provider determine how well your diabetes management plan is working. Blood glucose monitoring involves checking your blood glucose as often as directed, and keeping a record (log) of your results over time. Why should I monitor my blood glucose? Checking your blood glucose regularly can:  Help you understand  how food, exercise, illnesses, and medicines affect your blood glucose.  Let you know what your blood glucose is at any time. You can quickly tell if you are having low blood glucose (hypoglycemia) or high blood glucose (hyperglycemia).  Help you and your health care provider adjust your medicines as needed.  When should I check my blood glucose? Follow instructions from your health care provider about how often to check your blood glucose. This may depend on:  The type of diabetes you have.  How well-controlled your diabetes is.  Medicines you are taking.  If you have type 1 diabetes:  Check your blood glucose at least 2 times a day.  Also check your blood glucose: ? Before every insulin injection. ? Before and after exercise. ? Between meals. ? 2 hours after a meal. ? Occasionally between 2:00 a.m. and 3:00 a.m., as directed. ? Before potentially dangerous tasks, like driving or using heavy machinery. ? At bedtime.  You may need to check your blood glucose more often, up to 6-10 times a day: ? If you use an insulin pump. ? If you need multiple daily injections (MDI). ? If your diabetes is not well-controlled. ? If you are ill. ? If you have a history of severe hypoglycemia. ? If you have a history of not knowing when your blood glucose is getting low (hypoglycemia unawareness). If you have type 2 diabetes:  If you take insulin or other diabetes medicines, check your blood glucose at least 2 times a day.  If you are on intensive insulin therapy, check your blood glucose at least 4 times a day. Occasionally, you may also need to check between 2:00 a.m. and 3:00 a.m., as directed.  Also check your blood glucose: ? Before and after exercise. ? Before potentially dangerous tasks, like driving or using heavy machinery.  You may need to check your blood glucose more often if: ? Your medicine is being adjusted. ? Your diabetes is not well-controlled. ? You are ill. What is  a blood glucose log?  A blood glucose log is a record of your blood glucose readings. It helps you and your health care provider: ? Look for patterns in your blood glucose over time. ? Adjust your diabetes management plan as needed.  Every time you check your blood glucose, write down your result and notes about things that may be affecting your blood glucose, such as your diet and exercise for the day.  Most glucose meters store a record of glucose readings in the meter. Some meters allow you to download your records to a computer. How do I check my blood glucose? Follow these steps to get accurate readings of your blood glucose: Supplies needed   Blood glucose meter.  Test strips for your meter. Each meter has its own strips. You must use the strips that come with your meter.  A needle to prick your finger (lancet). Do not use lancets more than once.  A device that holds the lancet (lancing device).  A journal or log book to write down your results. Procedure  Wash your hands with soap and water.  Prick the side of your finger (not the tip) with the lancet. Use a different finger each time.  Gently rub the finger until a small drop of blood appears.  Follow instructions that come with your meter for inserting the test strip, applying blood to the strip, and using your blood glucose meter.  Write down your result and any notes. Alternative testing sites  Some meters allow you to use areas of your body other than your finger (alternative sites) to test your blood.  If you think you may have hypoglycemia, or if you have hypoglycemia unawareness, do not use alternative sites. Use your finger instead.  Alternative sites may not be as accurate as the fingers, because blood flow is slower in these areas. This means that the result you get may be delayed, and it may be different from the result that you would get from your finger.  The most  common alternative sites  are: ? Forearm. ? Thigh. ? Palm of the hand. Additional tips  Always keep your supplies with you.  If you have questions or need help, all blood glucose meters have a 24-hour "hotline" number that you can call. You may also contact your health care provider.  After you use a few boxes of test strips, adjust (calibrate) your blood glucose meter by following instructions that came with your meter. This information is not intended to replace advice given to you by your health care provider. Make sure you discuss any questions you have with your health care provider. Document Released: 12/12/2003 Document Revised: 06/28/2016 Document Reviewed: 05/20/2016 Elsevier Interactive Patient Education  2017 [employer].

## 2017-07-23 NOTE — Progress Notes (Signed)
Sore throat x 1 week

## 2017-07-23 NOTE — Progress Notes (Signed)
Aaron Nelson, is a 83 y.o. male  MCN:[id number]  OQH:[id number]  DOB - 1930-05-16  No chief complaint on file.      Subjective:   Aaron Nelson is a 83 y.o. male with history of Hx of DM, chronic dizziness with orthostatic syncope, implanted loop recorder, myelodysplastic syndrome, BPH, and hypertension here today for an ED visit follow up. Patient was sent to the ED from here 2 weeks ago for hyperglycemia, by the time he got to the ED, BS had normalized, patient was discharged to be followed up in the clinic. BS has ranged between 150 and 220 since then, currently on Lantus 40 units QHS and 10 units bid short acting insulin. He has no hypoglycemic episode. Complaint today is mild sore throat, itchy but actually getting better after using salt and hot water to gaggle. No fever, no difficulty swallowing, no swelling or lumpy throat. No N/V/D. No change in bowel habit, no urinary symptom.  BP is controlled and no limitations to ADL. Patient has follow up appointment with oncologist tomorrow. Patient has No headache, No chest pain, No abdominal pain - No Nausea, No new weakness tingling or numbness, No Cough - SOB. He is here today with his son. BP is now controlled.   Problem  Leukemia in Remission (Hcc)    ALLERGIES: No Known Allergies  PAST MEDICAL HISTORY: Past Medical History:  Diagnosis Date  . Diabetes mellitus without complication ([place])   . Hypertension   . Leukemia ([place])     MEDICATIONS AT HOME: Prior to Admission medications   Medication Sig Start Date End Date Taking? Authorizing Provider  amLODipine (NORVASC) 2.5 MG tablet Take 1 tablet (2.5 mg total) by mouth daily. 07/23/17  Yes Tresa Garter, MD  Insulin Glargine (LANTUS SOLOSTAR) 100 UNIT/ML Solostar Pen Inject 40 Units into the skin daily at 10 pm. 07/23/17  Yes Eugene Zeiders E, MD  insulin lispro (HUMALOG KWIKPEN) 100 UNIT/ML KiwkPen 10 units with the two largest meals of the day. 07/23/17  Yes Tresa Garter, MD    Objective:   Vitals:   07/23/17 0932  BP: 138/74  Pulse: 85  Resp: 16  Temp: 98.2 F (36.8 C)  TempSrc: Oral  SpO2: 99%  Weight: 144 lb 3.2 oz (65.4 kg)   Exam General appearance : Awake, alert, not in any distress. Speech Clear. Not toxic looking, elderly, pleasant HEENT: Atraumatic and Normocephalic, pupils equally reactive to light and accomodation Neck: Supple, no JVD. No cervical lymphadenopathy.  Chest: Good air entry bilaterally, no added sounds  CVS: S1 S2 regular, no murmurs.  Abdomen: Bowel sounds present, Non tender and not distended with no gaurding, rigidity or rebound. Extremities: B/L Lower Ext shows no edema, both legs are warm to touch Neurology: Awake alert, and oriented X 3, CN II-XII intact, Non focal Skin: No Rash  Data Review Lab Results  Component Value Date   HGBA1C 14.6 07/15/2017    Assessment & Plan   1. Uncontrolled type 2 diabetes mellitus without complication, with long-term current use of insulin (HCC)  - Glucose (CBG) Continue current regimen and monitor BS regularly, please bring BS log to next appointment. - Insulin Glargine (LANTUS SOLOSTAR) 100 UNIT/ML Solostar Pen; Inject 40 Units into the skin daily at 10 pm.  Dispense: 5 pen; Refill: PRN - insulin lispro (HUMALOG KWIKPEN) 100 UNIT/ML KiwkPen; 10 units with the two largest meals of the day.  Dispense: 15 mL; Refill: 11  2. Essential hypertension Continue - amLODipine (  NORVASC) 2.5 MG tablet; Take 1 tablet (2.5 mg total) by mouth daily.  Dispense: 90 tablet; Refill: 3  3. Leukemia in remission, unspecified leukemia type ([place])  - Clinically stable - patient has appointment with oncologist tomorrow  4. Healthcare maintenance  - Pneumococcal conjugate vaccine 13-valent - Tdap vaccine greater than or equal to 7yo IM  Patient have been counseled extensively about nutrition and exercise. Other issues discussed during this visit include: low cholesterol diet,  weight control and daily exercise, foot care, annual eye examinations at Ophthalmology, importance of adherence with medications and regular follow-up. We also discussed long term complications of uncontrolled diabetes and hypertension.   Return in about 3 months (around 10/23/2017) for Hemoglobin A1C and Follow up, DM, Follow up HTN.  The patient was given clear instructions to go to ER or return to medical center if symptoms don't improve, worsen or new problems develop. The patient verbalized understanding. The patient was told to call to get lab results if they haven't heard anything in the next week.   This note has been created with Surveyor, quantity. Any transcriptional errors are unintentional.    Angelica Chessman, MD, [place], [place], [place], [place], [place]   07/23/2017, 10:26 AM

## 2017-07-23 NOTE — Telephone Encounter (Signed)
Flor from [facility] called requesting verbal orders for home health 2 times a week for 4 weeks. Please f/up

## 2017-07-24 ENCOUNTER — Telehealth: Payer: Self-pay | Admitting: *Deleted

## 2017-07-24 ENCOUNTER — Ambulatory Visit (HOSPITAL_BASED_OUTPATIENT_CLINIC_OR_DEPARTMENT_OTHER): Payer: Medicare Other | Admitting: Hematology and Oncology

## 2017-07-24 ENCOUNTER — Encounter: Payer: Self-pay | Admitting: Hematology and Oncology

## 2017-07-24 ENCOUNTER — Telehealth: Payer: Self-pay

## 2017-07-24 VITALS — BP 113/80 | HR 68 | Temp 98.1°F | Resp 18 | Ht 65.0 in | Wt 147.0 lb

## 2017-07-24 DIAGNOSIS — D709 Neutropenia, unspecified: Secondary | ICD-10-CM

## 2017-07-24 DIAGNOSIS — D473 Essential (hemorrhagic) thrombocythemia: Secondary | ICD-10-CM

## 2017-07-24 DIAGNOSIS — I10 Essential (primary) hypertension: Secondary | ICD-10-CM | POA: Diagnosis not present

## 2017-07-24 DIAGNOSIS — D649 Anemia, unspecified: Secondary | ICD-10-CM | POA: Diagnosis not present

## 2017-07-24 DIAGNOSIS — D696 Thrombocytopenia, unspecified: Secondary | ICD-10-CM | POA: Diagnosis not present

## 2017-07-24 DIAGNOSIS — R2681 Unsteadiness on feet: Secondary | ICD-10-CM | POA: Diagnosis not present

## 2017-07-24 DIAGNOSIS — D7581 Myelofibrosis: Secondary | ICD-10-CM | POA: Insufficient documentation

## 2017-07-24 DIAGNOSIS — E1165 Type 2 diabetes mellitus with hyperglycemia: Secondary | ICD-10-CM | POA: Diagnosis not present

## 2017-07-24 DIAGNOSIS — Z1589 Genetic susceptibility to other disease: Secondary | ICD-10-CM | POA: Insufficient documentation

## 2017-07-24 DIAGNOSIS — C9590 Leukemia, unspecified not having achieved remission: Secondary | ICD-10-CM | POA: Diagnosis not present

## 2017-07-24 NOTE — Progress Notes (Signed)
[facility] Follow-up Visit:  Assessment: Myelofibrosis [facility]) 83 year old male with diagnosis of primary myelofibrosis with associated neutropenia without recurrent infections, anemia not requiring transfusions and thrombocytosis. Patient has been having hematological abnormalities from extended period of time, the earliest noted 2005. Patient underwent at least 2 bone marrow biopsy evaluations, most recent on 01/09/17 documenting the recurrent condition.  IPSS score 3 -- high risk, mainly due to advanced age of the patient, anemia, and some systemic symptoms attribution of which is a little bit challenging. Patient's somnolence is not clearly related to myelofibrosis. He does not appear to have any problems with splenomegaly based on physical exam or symptoms and his appetite is excellent.   At this point in time, I did not believe that the initiation of his necessary as it is unlikely to improve the patient's symptoms. As he is not transfusion dependent this is unlikely to improve either.  He has somnolence through the day is likely related to his advanced age and possible obstructive sleep apnea diminishing quality to his nighttime sleep. Dyspnea on exertion is more likely related to cardiovascular disease considering severe hypertension the patient has presented with previously. Currently, blood pressure appears to be under significantly better control.  Plan: --Observation --RTC 3 months with labs  Voice recognition software was used and creation of this note. Despite my best effort at editing the text, some misspelling/errors may have occurred.   Orders Placed This Encounter  Procedures  . CBC & Diff and Retic    Standing Status:   Future    Standing Expiration Date:   07/24/2018  . Comprehensive metabolic panel    Standing Status:   Future    Standing Expiration Date:   07/24/2018  . Lactate dehydrogenase (LDH)    Standing Status:   Future    Standing Expiration  Date:   07/24/2018  . Ferritin    Standing Status:   Future    Standing Expiration Date:   07/24/2018  . Iron and TIBC    Standing Status:   Future    Standing Expiration Date:   07/24/2018    Cancer Staging No matching staging information was found for the patient.  All questions were answered.  . The patient knows to call the clinic with any problems, questions or concerns.  This note was electronically signed.    History of Presenting Illness Aaron Nelson 83 y.o. followed in the [facility] for diagnosis of myelofibrosis. Patient returns to the clinic to discuss on review of the previous records. No new complaints since last visit to the clinic. It appears that he no longer experiences headaches, lightheadedness, or dizziness as his blood pressure control has improved.  Review of the previous records demonstrates a bone marrow biopsy results consistent with primary myelofibrosis with positive testing for Aaron Nelson 2 mutation. Initially, patient was treated with lenalidomide, but experienced a syncopal event medication was discontinued. Subsequently, patient received no additional therapy. At this time, his son describes excessive somnolence with patient being able to fall asleep in the middle of the day while reading or while watching TV. Nevertheless, patient does not full asleep while engaged with others. He appears to rest well through the night as well without snoring. He wakes up only to urinate. Patient denies any fevers, chills, night sweats. No weight loss, no abdominal pain, early satiety. His appetite is currently excellent. He does take describe dyspnea with exertion, but he is able to maintain reasonable activity level that he is satisfied  with.  Hematological History: --BM Bx, 02/04/14: (Size decreased in number microcytosis and polychromasia without rouleaux no nucleated red blood cells. Leukocytosis decreased in number with absolute neutropenia and absolute lymphopenia. No increase  in blasts. Platelets are normal in number. Aspirate demonstrates reasonably normal results. Overall cellularity 60% no evidence of lymphomatous infiltrate or increase in blasts. Flow cytometry positive for presence of a small lambda monoclonal CD5 negative, CD10 negative B-cell population.Cytogen -- 46,XY, del(9)(q13q22) --BM Bx, 01/09/17: Bone marrow aspirate a spicular and contained insufficient particles for evaluation. Peripheral blood smear demonstrated decreased erythrocytes with microcytosis hypochromasia, schistocytes, nucleated red blood cells. Decreased white blood cell count with absolute and relative neutropenia, and relative lymphocytosis and monocytosis. Overall bone marrow cellularity increased at approximately 80-85%, increased number of atypical megakaryocytes with extremely pleomorphic nuclei. Neutrophil maturation is partially arrested, no increase of blasts. Increased fibrosis.  Treatment Hx: --Lenalidomide, 02/05/17: Medication held single day of administration due to hospitalization with syncope.  Medical History: Past Medical History:  Diagnosis Date  . Diabetes mellitus without complication ([place])   . Hypertension   . Leukemia [place])     Surgical History: Past Surgical History:  Procedure Laterality Date  . BONE MARROW BIOPSY  2017  . PORTACATH PLACEMENT  2017    Family History: No family history on file.  Social History: Social History   Social History  . Marital status: Divorced    Spouse name: N/A  . Number of children: N/A  . Years of education: N/A   Occupational History  . Not on file.   Social History Main Topics  . Smoking status: Never Smoker  . Smokeless tobacco: Never Used  . Alcohol use No  . Drug use: No  . Sexual activity: Not on file   Other Topics Concern  . Not on file   Social History Narrative  . No narrative on file    Allergies: No Known Allergies  Medications:  Current Outpatient Prescriptions  Medication Sig Dispense  Refill  . amLODipine (NORVASC) 2.5 MG tablet Take 1 tablet (2.5 mg total) by mouth daily. 90 tablet 3  . insulin aspart (NOVOLOG FLEXPEN) 100 UNIT/ML FlexPen Inject 10 units with two largest meals of the day 15 mL 11  . Insulin Glargine (LANTUS SOLOSTAR) 100 UNIT/ML Solostar Pen Inject 40 Units into the skin daily at 10 pm. 5 pen PRN   No current facility-administered medications for this visit.     Review of Systems: Review of Systems  Constitutional: Positive for fatigue.       Patient and his son report excessive somnolence with the patient being able to follow sleep any time not actively engaged with other people.  Neurological: Negative for headaches, seizures and speech difficulty.  All other systems reviewed and are negative.    PHYSICAL EXAMINATION Blood pressure 113/80, pulse 68, temperature 98.1 F (36.7 C), temperature source Oral, resp. rate 18, height '5\' 5"'  (1.651 m), weight 147 lb (66.7 kg), SpO2 100 %.  ECOG PERFORMANCE STATUS: 2 - Symptomatic, <50% confined to bed  Physical Exam  Constitutional:  Elderly frail male in no acute distress  HENT:  Head: Normocephalic and atraumatic.  Mouth/Throat: Oropharynx is clear and moist. No oropharyngeal exudate.  Eyes: Pupils are equal, round, and reactive to light. EOM are normal. No scleral icterus.  Neck: No thyromegaly present.  Cardiovascular: Normal rate and regular rhythm.   No murmur heard. Pulmonary/Chest: Breath sounds normal. No respiratory distress. He has no wheezes.  Abdominal: Soft. He exhibits no  distension.  No hepatosplenomegaly  Musculoskeletal: He exhibits no edema.  Lymphadenopathy:    He has no cervical adenopathy.  Neurological: He is alert. He has normal reflexes. No cranial nerve deficit.  Skin: Skin is warm. No rash noted. No pallor.      LABORATORY DATA: I have personally reviewed the data as listed: Office Visit on 07/23/2017  Component Date Value Ref Range Status  . POC Glucose  07/23/2017 186* 70 - 99 mg/dl Final       Ardath Sax, MD

## 2017-07-24 NOTE — Telephone Encounter (Signed)
Scheduled patient for  appointment with MD and lab on 11/2. Printed avs

## 2017-07-24 NOTE — Assessment & Plan Note (Signed)
83 year old male with diagnosis of primary myelofibrosis with associated neutropenia without recurrent infections, anemia not requiring transfusions and thrombocytosis. Patient has been having hematological abnormalities from extended period of time, the earliest noted 2005. Patient underwent at least 2 bone marrow biopsy evaluations, most recent on 01/09/17 documenting the recurrent condition.  IPSS score 3 -- high risk, mainly due to advanced age of the patient, anemia, and some systemic symptoms attribution of which is a little bit challenging. Patient's somnolence is not clearly related to myelofibrosis. He does not appear to have any problems with splenomegaly based on physical exam or symptoms and his appetite is excellent.   At this point in time, I did not believe that the initiation of his necessary as it is unlikely to improve the patient's symptoms. As he is not transfusion dependent this is unlikely to improve either.  He has somnolence through the day is likely related to his advanced age and possible obstructive sleep apnea diminishing quality to his nighttime sleep. Dyspnea on exertion is more likely related to cardiovascular disease considering severe hypertension the patient has presented with previously. Currently, blood pressure appears to be under significantly better control.  Plan: --Observation --RTC 3 months with labs  Voice recognition software was used and creation of this note. Despite my best effort at editing the text, some misspelling/errors may have occurred.

## 2017-07-24 NOTE — Telephone Encounter (Signed)
Spoke with patient's son, [name] ([place]).  He reports that he does not recall patient having any presyncopal or syncopal symptoms during pause episode at 2010 on 07/20/17.  Patient sleeps off and on throughout the day and evening per son.  Advised I will review episode with Dr. Caryl Comes and call him back if any additional recommendations.  [name] requests an urgent call the next time patient has an episode.  Advised that depending on a number of factors, monitor may not transmit nightly and that it is not uncommon for this to happen.  Advised that this pause episode was just received today.  [name] plans to move monitor close to patient's bed and instruct him not to touch it to encourage nightly transmitting.  He is appreciative of explanation.  He is aware of appointment with Tommye Standard, PA on 08/01/17 at 11:00am and denies additional questions or concerns at this time.

## 2017-07-25 ENCOUNTER — Encounter: Payer: Self-pay | Admitting: *Deleted

## 2017-07-25 DIAGNOSIS — I10 Essential (primary) hypertension: Secondary | ICD-10-CM | POA: Diagnosis not present

## 2017-07-25 DIAGNOSIS — E1165 Type 2 diabetes mellitus with hyperglycemia: Secondary | ICD-10-CM | POA: Diagnosis not present

## 2017-07-25 DIAGNOSIS — D696 Thrombocytopenia, unspecified: Secondary | ICD-10-CM | POA: Diagnosis not present

## 2017-07-25 DIAGNOSIS — R2681 Unsteadiness on feet: Secondary | ICD-10-CM | POA: Diagnosis not present

## 2017-07-25 DIAGNOSIS — C9590 Leukemia, unspecified not having achieved remission: Secondary | ICD-10-CM | POA: Diagnosis not present

## 2017-07-25 DIAGNOSIS — D649 Anemia, unspecified: Secondary | ICD-10-CM | POA: Diagnosis not present

## 2017-07-25 NOTE — Telephone Encounter (Signed)
Reviewed pause episode with Dr. Caryl Comes on 07/24/17.  As patient's son is unsure if patient was awake during episode, plan to push appointment with Tommye Standard, PA, out by 6 more weeks to continue to monitor for recurrent episodes per Dr. Caryl Comes.  [name] is agreeable to this plan.  He is agreeable to scheduling f/u appointment on 09/11/17 at 2:00pm with APP.  [name] is appreciative of call and denies additional questions or concerns at this time.  Reminder letter mailed today per his request.

## 2017-07-28 ENCOUNTER — Other Ambulatory Visit: Payer: Self-pay | Admitting: Internal Medicine

## 2017-07-28 DIAGNOSIS — D696 Thrombocytopenia, unspecified: Secondary | ICD-10-CM | POA: Diagnosis not present

## 2017-07-28 DIAGNOSIS — E1165 Type 2 diabetes mellitus with hyperglycemia: Secondary | ICD-10-CM | POA: Diagnosis not present

## 2017-07-28 DIAGNOSIS — I10 Essential (primary) hypertension: Secondary | ICD-10-CM | POA: Diagnosis not present

## 2017-07-28 DIAGNOSIS — R2681 Unsteadiness on feet: Secondary | ICD-10-CM | POA: Diagnosis not present

## 2017-07-28 DIAGNOSIS — C9590 Leukemia, unspecified not having achieved remission: Secondary | ICD-10-CM | POA: Diagnosis not present

## 2017-07-28 DIAGNOSIS — D649 Anemia, unspecified: Secondary | ICD-10-CM | POA: Diagnosis not present

## 2017-07-29 DIAGNOSIS — D649 Anemia, unspecified: Secondary | ICD-10-CM | POA: Diagnosis not present

## 2017-07-29 DIAGNOSIS — R2681 Unsteadiness on feet: Secondary | ICD-10-CM | POA: Diagnosis not present

## 2017-07-29 DIAGNOSIS — E1165 Type 2 diabetes mellitus with hyperglycemia: Secondary | ICD-10-CM | POA: Diagnosis not present

## 2017-07-29 DIAGNOSIS — D696 Thrombocytopenia, unspecified: Secondary | ICD-10-CM | POA: Diagnosis not present

## 2017-07-29 DIAGNOSIS — C9590 Leukemia, unspecified not having achieved remission: Secondary | ICD-10-CM | POA: Diagnosis not present

## 2017-07-29 DIAGNOSIS — I10 Essential (primary) hypertension: Secondary | ICD-10-CM | POA: Diagnosis not present

## 2017-07-30 DIAGNOSIS — D649 Anemia, unspecified: Secondary | ICD-10-CM | POA: Diagnosis not present

## 2017-07-30 DIAGNOSIS — R2681 Unsteadiness on feet: Secondary | ICD-10-CM | POA: Diagnosis not present

## 2017-07-30 DIAGNOSIS — E1165 Type 2 diabetes mellitus with hyperglycemia: Secondary | ICD-10-CM | POA: Diagnosis not present

## 2017-07-30 DIAGNOSIS — C9590 Leukemia, unspecified not having achieved remission: Secondary | ICD-10-CM | POA: Diagnosis not present

## 2017-07-30 DIAGNOSIS — I10 Essential (primary) hypertension: Secondary | ICD-10-CM | POA: Diagnosis not present

## 2017-07-30 DIAGNOSIS — D696 Thrombocytopenia, unspecified: Secondary | ICD-10-CM | POA: Diagnosis not present

## 2017-07-31 ENCOUNTER — Telehealth: Payer: Self-pay | Admitting: Internal Medicine

## 2017-07-31 DIAGNOSIS — I10 Essential (primary) hypertension: Secondary | ICD-10-CM | POA: Diagnosis not present

## 2017-07-31 DIAGNOSIS — E1165 Type 2 diabetes mellitus with hyperglycemia: Secondary | ICD-10-CM | POA: Diagnosis not present

## 2017-07-31 DIAGNOSIS — D649 Anemia, unspecified: Secondary | ICD-10-CM | POA: Diagnosis not present

## 2017-07-31 DIAGNOSIS — D696 Thrombocytopenia, unspecified: Secondary | ICD-10-CM | POA: Diagnosis not present

## 2017-07-31 DIAGNOSIS — R2681 Unsteadiness on feet: Secondary | ICD-10-CM | POA: Diagnosis not present

## 2017-07-31 DIAGNOSIS — C9590 Leukemia, unspecified not having achieved remission: Secondary | ICD-10-CM | POA: Diagnosis not present

## 2017-07-31 NOTE — Telephone Encounter (Signed)
Flor with [facility] at [employer], called stated patient needs RX for Walker.(front wheeled walker)  Please send order to [facility] ph: [phone] Fax: [phone]   Please call Flor to inform when RX  Sent.

## 2017-08-01 ENCOUNTER — Encounter: Payer: Self-pay | Admitting: Internal Medicine

## 2017-08-01 ENCOUNTER — Encounter: Payer: Medicare Other | Admitting: Physician Assistant

## 2017-08-01 DIAGNOSIS — R2681 Unsteadiness on feet: Secondary | ICD-10-CM | POA: Diagnosis not present

## 2017-08-01 DIAGNOSIS — I10 Essential (primary) hypertension: Secondary | ICD-10-CM | POA: Diagnosis not present

## 2017-08-01 DIAGNOSIS — D649 Anemia, unspecified: Secondary | ICD-10-CM | POA: Diagnosis not present

## 2017-08-01 DIAGNOSIS — R300 Dysuria: Secondary | ICD-10-CM | POA: Diagnosis not present

## 2017-08-01 DIAGNOSIS — D696 Thrombocytopenia, unspecified: Secondary | ICD-10-CM | POA: Diagnosis not present

## 2017-08-01 DIAGNOSIS — E1165 Type 2 diabetes mellitus with hyperglycemia: Secondary | ICD-10-CM | POA: Diagnosis not present

## 2017-08-01 DIAGNOSIS — C9590 Leukemia, unspecified not having achieved remission: Secondary | ICD-10-CM | POA: Diagnosis not present

## 2017-08-01 NOTE — Telephone Encounter (Signed)
Anne Ng called stated "while putting the cath I met resistance, was able to get through had 500 cc clear, yellow urine , no odor and no signs of infections.  UACS was brought to the lab.  Patient stated he had just voided prior to my arrival"  Anne Ng also want verification that order was faxed.  Please follow up

## 2017-08-01 NOTE — Telephone Encounter (Signed)
If above symptoms are still present, he needs to be evaluated at the ED for urinary retention. Orders written; could you please fax? Thanks

## 2017-08-01 NOTE — Telephone Encounter (Signed)
Annette from [facility] called requesting orders for an in and out catheter for UACS due to pt. Having abdominal pain and difficulty urinating. Please f/u

## 2017-08-02 LAB — CUP PACEART REMOTE DEVICE CHECK
Date Time Interrogation Session: 20180727233856
MDC IDC PG IMPLANT DT: 20180227

## 2017-08-04 ENCOUNTER — Telehealth: Payer: Self-pay | Admitting: Internal Medicine

## 2017-08-04 DIAGNOSIS — I10 Essential (primary) hypertension: Secondary | ICD-10-CM | POA: Diagnosis not present

## 2017-08-04 DIAGNOSIS — E1165 Type 2 diabetes mellitus with hyperglycemia: Secondary | ICD-10-CM | POA: Diagnosis not present

## 2017-08-04 DIAGNOSIS — D649 Anemia, unspecified: Secondary | ICD-10-CM | POA: Diagnosis not present

## 2017-08-04 DIAGNOSIS — C9590 Leukemia, unspecified not having achieved remission: Secondary | ICD-10-CM | POA: Diagnosis not present

## 2017-08-04 DIAGNOSIS — R2681 Unsteadiness on feet: Secondary | ICD-10-CM | POA: Diagnosis not present

## 2017-08-04 DIAGNOSIS — D696 Thrombocytopenia, unspecified: Secondary | ICD-10-CM | POA: Diagnosis not present

## 2017-08-04 NOTE — Telephone Encounter (Signed)
Please see my notes below. He was to go to the ED for urinary symptoms. I okayed only orders for wheelchair which Opal Sidles would have faxed.

## 2017-08-04 NOTE — Telephone Encounter (Signed)
Will route to PCP. I did not fax any paperwork for this pt. I am going to CC [place] on this message to see when paperwork was faxed.

## 2017-08-04 NOTE — Telephone Encounter (Signed)
Aaron Nelson from [facility] called the office asking to speak with PCP in regards to getting verbal orders for OT two times a week for four weeks for strengthening. Recommended to use breather to reduce shortness of breath.

## 2017-08-05 NOTE — Telephone Encounter (Signed)
Paperwork not in my faxed folder.

## 2017-08-06 DIAGNOSIS — C9590 Leukemia, unspecified not having achieved remission: Secondary | ICD-10-CM | POA: Diagnosis not present

## 2017-08-06 DIAGNOSIS — D649 Anemia, unspecified: Secondary | ICD-10-CM | POA: Diagnosis not present

## 2017-08-06 DIAGNOSIS — R2681 Unsteadiness on feet: Secondary | ICD-10-CM | POA: Diagnosis not present

## 2017-08-06 DIAGNOSIS — D696 Thrombocytopenia, unspecified: Secondary | ICD-10-CM | POA: Diagnosis not present

## 2017-08-06 DIAGNOSIS — I10 Essential (primary) hypertension: Secondary | ICD-10-CM | POA: Diagnosis not present

## 2017-08-06 DIAGNOSIS — E1165 Type 2 diabetes mellitus with hyperglycemia: Secondary | ICD-10-CM | POA: Diagnosis not present

## 2017-08-07 DIAGNOSIS — I10 Essential (primary) hypertension: Secondary | ICD-10-CM | POA: Diagnosis not present

## 2017-08-07 DIAGNOSIS — R2681 Unsteadiness on feet: Secondary | ICD-10-CM | POA: Diagnosis not present

## 2017-08-07 DIAGNOSIS — D649 Anemia, unspecified: Secondary | ICD-10-CM | POA: Diagnosis not present

## 2017-08-07 DIAGNOSIS — E1165 Type 2 diabetes mellitus with hyperglycemia: Secondary | ICD-10-CM | POA: Diagnosis not present

## 2017-08-07 DIAGNOSIS — C9590 Leukemia, unspecified not having achieved remission: Secondary | ICD-10-CM | POA: Diagnosis not present

## 2017-08-07 DIAGNOSIS — D696 Thrombocytopenia, unspecified: Secondary | ICD-10-CM | POA: Diagnosis not present

## 2017-08-07 NOTE — Telephone Encounter (Signed)
Multiple home care request have been faxed on behalf of the patient.

## 2017-08-07 NOTE — Telephone Encounter (Signed)
MA spoke with [facility] and patient if receiving PT and HH. No further questions at this time.

## 2017-08-08 ENCOUNTER — Ambulatory Visit (HOSPITAL_COMMUNITY)
Admission: EM | Admit: 2017-08-08 | Discharge: 2017-08-08 | Disposition: A | Discharge status: Home / Self Care | Payer: Medicare Other | Attending: Family Medicine | Admitting: Family Medicine

## 2017-08-08 ENCOUNTER — Encounter (HOSPITAL_COMMUNITY): Payer: Self-pay | Admitting: Emergency Medicine

## 2017-08-08 DIAGNOSIS — E1165 Type 2 diabetes mellitus with hyperglycemia: Secondary | ICD-10-CM | POA: Diagnosis not present

## 2017-08-08 DIAGNOSIS — J029 Acute pharyngitis, unspecified: Secondary | ICD-10-CM | POA: Diagnosis not present

## 2017-08-08 DIAGNOSIS — E119 Type 2 diabetes mellitus without complications: Secondary | ICD-10-CM

## 2017-08-08 DIAGNOSIS — Z794 Long term (current) use of insulin: Secondary | ICD-10-CM | POA: Diagnosis not present

## 2017-08-08 LAB — GLUCOSE, CAPILLARY: Glucose-Capillary: 279 mg/dL — ABNORMAL HIGH (ref 65–99)

## 2017-08-08 LAB — POCT RAPID STREP A: Streptococcus, Group A Screen (Direct): NEGATIVE

## 2017-08-08 MED ORDER — CHLORHEXIDINE GLUCONATE 0.12 % MT SOLN: 10.0000 mL | Freq: Two times a day (BID) | OROMUCOSAL | 0 refills | Status: DC

## 2017-08-08 NOTE — Discharge Instructions (Signed)
Strep test was negative, blood sugar was 279, it is ok to take the insulin as directed by your primary care provider. Keep a log of your sugars, and if they run high or low, cary this to your primary care provider. If sore throat persists past one week follow up with your PCP or return to clinic.

## 2017-08-08 NOTE — Telephone Encounter (Signed)
What is Nancy's Phone Number?

## 2017-08-08 NOTE — Telephone Encounter (Signed)
MA spoke and provided VO yesterday.

## 2017-08-08 NOTE — ED Triage Notes (Signed)
PT reports sore throat for 1 week. No other symptoms.

## 2017-08-08 NOTE — ED Provider Notes (Signed)
  [facility]   [id number] 08/08/17 Arrival Time: 1004   SUBJECTIVE:  Aaron Nelson is a 83 y.o. male who presents to the urgent care  with complaint of sore throat ongoing for 1 week. He states "I think I have strep throat" he denies any cough, did have fever the first few days, this is since resolved, no congestion or runny nose, no nausea, vomiting, diarrhea, or abdominal pain. No ear pain or pressure, chest pain palpitations, or other symptoms. Is also requesting a check of his blood sugar, states it was 55 this morning when he woke up. He is a type II diabetic, he takes insulin. Otherwise, has no other complaints.  ROS: As per HPI, remainder of ROS negative.   OBJECTIVE:  Vitals:   08/08/17 1029 08/08/17 1030  BP:  (!) 158/66  Pulse:  67  Resp:  16  Temp:  98.7 F (37.1 C)  TempSrc:  Oral  SpO2:  100%  Weight: 147 lb (66.7 kg)      General appearance: alert; no distress HEENT: normocephalic; atraumatic; conjunctivae normal; Oropharynx clear without ulcerations on the mucosal membranes, tonsils are 0, no exudate or erythema. Tympanic membranes pearly grey without erythema, or bulging, and no sinus tenderness Neck: No cervical lymphadenopathy Lungs: clear to auscultation bilaterally Heart: regular rate and rhythm Abdomen: soft, non-tender; bowel sounds normal; no masses or organomegaly; no guarding or rebound tenderness Musculoskeletal/extremities: Pulses +2, extremities are grossly symmetrical,  Skin: warm and dry Neurologic: Grossly normal Psychological:  alert and cooperative; normal mood and affect     ASSESSMENT & PLAN:  1. Sore throat     Meds ordered this encounter  Medications  . chlorhexidine (PERIDEX) 0.12 % solution    Sig: Use as directed 10 mLs in the mouth or throat 2 (two) times daily.    Dispense:  120 mL    Refill:  0    Order Specific Question:   Supervising Provider    Answer:   Robyn Haber [[id number]]   Patient has an elevated blood  glucose, he has not had his insulin today. Okay to proceed with his regular dose, provided counseling on diabetes, recommend following up with primary care provider as needed. For sore throat, strep test negative, most likely viral, given prescription for Peridex to use as needed. Follow-up with primary care as needed. Reviewed expectations re: course of current medical issues. Questions answered. Outlined signs and symptoms indicating need for more acute intervention. Patient verbalized understanding. After Visit Summary given.    Procedures:     Labs Reviewed  GLUCOSE, CAPILLARY - Abnormal; Notable for the following:       Result Value   Glucose-Capillary 279 (*)    All other components within normal limits  CULTURE, GROUP A STREP [facility])  POCT RAPID STREP A    No results found.  No Known Allergies  PMHx, SurgHx, SocialHx, Medications, and Allergies were reviewed in the Visit Navigator and updated as appropriate.       Barnet Glasgow, NP 08/08/17 1146

## 2017-08-10 LAB — CULTURE, GROUP A STREP (THRC)

## 2017-08-11 ENCOUNTER — Telehealth: Payer: Self-pay | Admitting: Internal Medicine

## 2017-08-11 DIAGNOSIS — R2681 Unsteadiness on feet: Secondary | ICD-10-CM | POA: Diagnosis not present

## 2017-08-11 DIAGNOSIS — I10 Essential (primary) hypertension: Secondary | ICD-10-CM | POA: Diagnosis not present

## 2017-08-11 DIAGNOSIS — D696 Thrombocytopenia, unspecified: Secondary | ICD-10-CM | POA: Diagnosis not present

## 2017-08-11 DIAGNOSIS — C9590 Leukemia, unspecified not having achieved remission: Secondary | ICD-10-CM | POA: Diagnosis not present

## 2017-08-11 DIAGNOSIS — E1165 Type 2 diabetes mellitus with hyperglycemia: Secondary | ICD-10-CM | POA: Diagnosis not present

## 2017-08-11 DIAGNOSIS — D649 Anemia, unspecified: Secondary | ICD-10-CM | POA: Diagnosis not present

## 2017-08-11 NOTE — Telephone Encounter (Signed)
Melissa from [facility] called requesting a follow up call for patient. Pt had a physical therapist at the home and pt stated that he was not feeling well, pt has been feeling dizziness and lightheadedness . Checked blood sugar and it read 56 , pt was give a peanut butter sandwich after and it only went up to 59.... Pt has eaten breakfast already but BS was still low. Pt has a similar episode 08/06/17 and EMS was called. Nurse from [facility] thinks novalog might be too much for patient. Please f/up

## 2017-08-11 NOTE — Telephone Encounter (Signed)
Please advise on this concern.

## 2017-08-11 NOTE — Telephone Encounter (Signed)
I haven't seen this patient, will defer to Dr. Doreene Burke

## 2017-08-13 ENCOUNTER — Ambulatory Visit: Payer: Medicare Other | Attending: Internal Medicine | Admitting: Physician Assistant

## 2017-08-13 VITALS — BP 159/79 | HR 65 | Temp 97.7°F | Ht 65.0 in | Wt 151.0 lb

## 2017-08-13 DIAGNOSIS — K0889 Other specified disorders of teeth and supporting structures: Secondary | ICD-10-CM

## 2017-08-13 DIAGNOSIS — C9590 Leukemia, unspecified not having achieved remission: Secondary | ICD-10-CM | POA: Diagnosis not present

## 2017-08-13 DIAGNOSIS — K068 Other specified disorders of gingiva and edentulous alveolar ridge: Secondary | ICD-10-CM

## 2017-08-13 DIAGNOSIS — E1165 Type 2 diabetes mellitus with hyperglycemia: Secondary | ICD-10-CM | POA: Diagnosis not present

## 2017-08-13 DIAGNOSIS — R2681 Unsteadiness on feet: Secondary | ICD-10-CM | POA: Diagnosis not present

## 2017-08-13 DIAGNOSIS — I10 Essential (primary) hypertension: Secondary | ICD-10-CM | POA: Diagnosis not present

## 2017-08-13 DIAGNOSIS — Z794 Long term (current) use of insulin: Secondary | ICD-10-CM | POA: Diagnosis not present

## 2017-08-13 DIAGNOSIS — IMO0001 Reserved for inherently not codable concepts without codable children: Secondary | ICD-10-CM

## 2017-08-13 DIAGNOSIS — D649 Anemia, unspecified: Secondary | ICD-10-CM | POA: Diagnosis not present

## 2017-08-13 DIAGNOSIS — D696 Thrombocytopenia, unspecified: Secondary | ICD-10-CM | POA: Diagnosis not present

## 2017-08-13 LAB — GLUCOSE, POCT (MANUAL RESULT ENTRY): POC GLUCOSE: 276 mg/dL — AB (ref 70–99)

## 2017-08-13 MED ORDER — LIDOCAINE VISCOUS 2 % MT SOLN: OROMUCOSAL | 0 refills | Status: DC

## 2017-08-13 MED FILL — LIDOCAINE 2% VISCOUS SOLN: 2 | 30 days supply | Qty: 100 | Fill #0

## 2017-08-13 NOTE — Progress Notes (Signed)
Patient ID: Aaron Nelson, male   DOB: Aug 02, 1930, 83 y.o.   MRN: [id number]     [name], is a 83 y.o. male  JJK:[id number]  [id number]:[id number]  DOB - [birth year]  Subjective:  Chief Complaint and HPI: Aaron Nelson is a 83 y.o. male here today for a follow up visit after being Seen at [facility] 08/08/2017 for ST.  Strep test and throat culture were negative.  He is actually complaining  Of R lower gum pain for about 1 month.  He had partial dentures made in [place], [place] about 3 months ago.  They never fit well.  He hasn't been able to wear them now for about 1 month bc they are rubbing against his lower gums.  He also c/o loose tooth.  He has not taken any of his diabetes medications today and admits to poor compliance at times.  ROS:   Constitutional:  No f/c, No night sweats, No unexplained weight loss. EENT:  No vision changes, No blurry vision, No hearing changes. No throat or ear problems. + mouth pain Respiratory: No cough, No SOB Cardiac: No CP, no palpitations GI:  No abd pain, No N/V/D. GU: No Urinary s/sx Musculoskeletal: No joint pain Neuro: No headache, no dizziness, no motor weakness.  Skin: No rash Endocrine:  No polydipsia. No polyuria.  Psych: Denies SI/HI  No problems updated.  ALLERGIES: No Known Allergies  PAST MEDICAL HISTORY: Past Medical History:  Diagnosis Date  . Diabetes mellitus without complication ([place])   . Hypertension     MEDICATIONS AT HOME: Prior to Admission medications   Medication Sig Start Date End Date Taking? Authorizing Provider  amLODipine (NORVASC) 2.5 MG tablet Take 1 tablet (2.5 mg total) by mouth daily. 07/23/17  Yes Tresa Garter, MD  chlorhexidine (PERIDEX) 0.12 % solution Use as directed 10 mLs in the mouth or throat 2 (two) times daily. 08/08/17  Yes Barnet Glasgow, NP  insulin aspart (NOVOLOG FLEXPEN) 100 UNIT/ML FlexPen Inject 10 units with two largest meals of the day 07/23/17  Yes Jegede, Olugbemiga E, MD  Insulin Glargine  (LANTUS SOLOSTAR) 100 UNIT/ML Solostar Pen Inject 40 Units into the skin daily at 10 pm. 07/23/17  Yes Jegede, Olugbemiga E, MD  lidocaine (XYLOCAINE) 2 % solution Apply small amount to painful gums with a qtip as needed.  Avoid swallowing 08/13/17   Argentina Donovan, PA-C     Objective:  EXAM:   Vitals:   08/13/17 1052  BP: (!) 159/79  Pulse: 65  Temp: 97.7 F (36.5 C)  TempSrc: Oral  SpO2: 100%  Weight: 151 lb (68.5 kg)  Height: 5\' 5"  (1.651 m)    General appearance : A&OX3. NAD. Non-toxic-appearing HEENT: Atraumatic and Normocephalic.  PERRLA. EOM intact.  TM clear B. Mouth-MMM, post pharynx WNL w/o erythema, No PND.  Poor dentition overall.  R posterior gums on R with swelling and tenderness w/o abscess.  He does have a lower incisor on the R that is loose. Neck: supple, no JVD. No cervical lymphadenopathy. No thyromegaly Chest/Lungs:  Breathing-non-labored, Good air entry bilaterally, breath sounds normal without rales, rhonchi, or wheezing  CVS: S1 S2 regular, no murmurs, gallops, rubs  Extremities: Bilateral Lower Ext shows no edema, both legs are warm to touch with = pulse throughout Neurology:  CN II-XII grossly intact, Non focal.   Psych:  TP linear. J/I WNL. Normal speech. Appropriate eye contact and affect.  Skin:  No Rash  Data Review Lab Results  Component Value Date  HGBA1C 14.6 07/15/2017     Assessment & Plan   1. Uncontrolled type 2 diabetes mellitus without complication, with long-term current use of insulin (HCC) Uncontrolled.  Poor compliance. Adhere to current regimen is extremely important. - Glucose (CBG) - Urinalysis Dipstick  2. Essential hypertension Hasn;t taken meds today so not controlled.  Questionable compliance.  Stressed importance of compliance with meds  3. Pain in gums - Ambulatory referral to Dentistry - lidocaine (XYLOCAINE) 2 % solution; Apply small amount to painful gums with a qtip as needed.  Avoid swallowing  Dispense: 100  mL; Refill: 0  4. Tooth loose - Ambulatory referral to Dentistry   Patient have been counseled extensively about nutrition and exercise  Return in about 6 weeks (around 09/24/2017) for Dr Doreene Burke for htn, DM.  The patient was given clear instructions to go to ER or return to medical center if symptoms don't improve, worsen or new problems develop. The patient verbalized understanding. The patient was told to call to get lab results if they haven't heard anything in the next week.     Freeman Caldron, PA-C [facility] [place], [place]   08/13/2017, 11:29 AM

## 2017-08-14 DIAGNOSIS — D696 Thrombocytopenia, unspecified: Secondary | ICD-10-CM | POA: Diagnosis not present

## 2017-08-14 DIAGNOSIS — I10 Essential (primary) hypertension: Secondary | ICD-10-CM | POA: Diagnosis not present

## 2017-08-14 DIAGNOSIS — R2681 Unsteadiness on feet: Secondary | ICD-10-CM | POA: Diagnosis not present

## 2017-08-14 DIAGNOSIS — E1165 Type 2 diabetes mellitus with hyperglycemia: Secondary | ICD-10-CM | POA: Diagnosis not present

## 2017-08-14 DIAGNOSIS — D649 Anemia, unspecified: Secondary | ICD-10-CM | POA: Diagnosis not present

## 2017-08-14 DIAGNOSIS — C9590 Leukemia, unspecified not having achieved remission: Secondary | ICD-10-CM | POA: Diagnosis not present

## 2017-08-16 DIAGNOSIS — R2681 Unsteadiness on feet: Secondary | ICD-10-CM | POA: Diagnosis not present

## 2017-08-16 DIAGNOSIS — D696 Thrombocytopenia, unspecified: Secondary | ICD-10-CM | POA: Diagnosis not present

## 2017-08-16 DIAGNOSIS — D649 Anemia, unspecified: Secondary | ICD-10-CM | POA: Diagnosis not present

## 2017-08-16 DIAGNOSIS — C9590 Leukemia, unspecified not having achieved remission: Secondary | ICD-10-CM | POA: Diagnosis not present

## 2017-08-16 DIAGNOSIS — E1165 Type 2 diabetes mellitus with hyperglycemia: Secondary | ICD-10-CM | POA: Diagnosis not present

## 2017-08-16 DIAGNOSIS — I10 Essential (primary) hypertension: Secondary | ICD-10-CM | POA: Diagnosis not present

## 2017-08-18 ENCOUNTER — Ambulatory Visit (INDEPENDENT_AMBULATORY_CARE_PROVIDER_SITE_OTHER): Payer: Medicare Other | Admitting: *Deleted

## 2017-08-18 DIAGNOSIS — D649 Anemia, unspecified: Secondary | ICD-10-CM | POA: Diagnosis not present

## 2017-08-18 DIAGNOSIS — E1165 Type 2 diabetes mellitus with hyperglycemia: Secondary | ICD-10-CM | POA: Diagnosis not present

## 2017-08-18 DIAGNOSIS — R55 Syncope and collapse: Secondary | ICD-10-CM

## 2017-08-18 DIAGNOSIS — D696 Thrombocytopenia, unspecified: Secondary | ICD-10-CM | POA: Diagnosis not present

## 2017-08-18 DIAGNOSIS — R2681 Unsteadiness on feet: Secondary | ICD-10-CM | POA: Diagnosis not present

## 2017-08-18 DIAGNOSIS — C9590 Leukemia, unspecified not having achieved remission: Secondary | ICD-10-CM | POA: Diagnosis not present

## 2017-08-18 DIAGNOSIS — I10 Essential (primary) hypertension: Secondary | ICD-10-CM | POA: Diagnosis not present

## 2017-08-18 NOTE — Progress Notes (Signed)
[facility] Summary Report / Loop Recorder 

## 2017-08-19 DIAGNOSIS — D649 Anemia, unspecified: Secondary | ICD-10-CM | POA: Diagnosis not present

## 2017-08-19 DIAGNOSIS — E1165 Type 2 diabetes mellitus with hyperglycemia: Secondary | ICD-10-CM | POA: Diagnosis not present

## 2017-08-19 DIAGNOSIS — I10 Essential (primary) hypertension: Secondary | ICD-10-CM | POA: Diagnosis not present

## 2017-08-19 DIAGNOSIS — D696 Thrombocytopenia, unspecified: Secondary | ICD-10-CM | POA: Diagnosis not present

## 2017-08-19 DIAGNOSIS — C9590 Leukemia, unspecified not having achieved remission: Secondary | ICD-10-CM | POA: Diagnosis not present

## 2017-08-19 DIAGNOSIS — R2681 Unsteadiness on feet: Secondary | ICD-10-CM | POA: Diagnosis not present

## 2017-08-20 DIAGNOSIS — I10 Essential (primary) hypertension: Secondary | ICD-10-CM | POA: Diagnosis not present

## 2017-08-20 DIAGNOSIS — C9590 Leukemia, unspecified not having achieved remission: Secondary | ICD-10-CM | POA: Diagnosis not present

## 2017-08-20 DIAGNOSIS — D696 Thrombocytopenia, unspecified: Secondary | ICD-10-CM | POA: Diagnosis not present

## 2017-08-20 DIAGNOSIS — E1165 Type 2 diabetes mellitus with hyperglycemia: Secondary | ICD-10-CM | POA: Diagnosis not present

## 2017-08-20 DIAGNOSIS — D649 Anemia, unspecified: Secondary | ICD-10-CM | POA: Diagnosis not present

## 2017-08-20 DIAGNOSIS — R2681 Unsteadiness on feet: Secondary | ICD-10-CM | POA: Diagnosis not present

## 2017-08-21 DIAGNOSIS — C9590 Leukemia, unspecified not having achieved remission: Secondary | ICD-10-CM | POA: Diagnosis not present

## 2017-08-21 DIAGNOSIS — D696 Thrombocytopenia, unspecified: Secondary | ICD-10-CM | POA: Diagnosis not present

## 2017-08-21 DIAGNOSIS — D649 Anemia, unspecified: Secondary | ICD-10-CM | POA: Diagnosis not present

## 2017-08-21 DIAGNOSIS — I10 Essential (primary) hypertension: Secondary | ICD-10-CM | POA: Diagnosis not present

## 2017-08-21 DIAGNOSIS — R2681 Unsteadiness on feet: Secondary | ICD-10-CM | POA: Diagnosis not present

## 2017-08-21 DIAGNOSIS — E1165 Type 2 diabetes mellitus with hyperglycemia: Secondary | ICD-10-CM | POA: Diagnosis not present

## 2017-08-22 LAB — CUP PACEART REMOTE DEVICE CHECK
Date Time Interrogation Session: 20180827000744
MDC IDC PG IMPLANT DT: 20180227

## 2017-08-25 DIAGNOSIS — I10 Essential (primary) hypertension: Secondary | ICD-10-CM | POA: Diagnosis not present

## 2017-08-25 DIAGNOSIS — D696 Thrombocytopenia, unspecified: Secondary | ICD-10-CM | POA: Diagnosis not present

## 2017-08-25 DIAGNOSIS — D649 Anemia, unspecified: Secondary | ICD-10-CM | POA: Diagnosis not present

## 2017-08-25 DIAGNOSIS — R2681 Unsteadiness on feet: Secondary | ICD-10-CM | POA: Diagnosis not present

## 2017-08-25 DIAGNOSIS — E1165 Type 2 diabetes mellitus with hyperglycemia: Secondary | ICD-10-CM | POA: Diagnosis not present

## 2017-08-25 DIAGNOSIS — C9590 Leukemia, unspecified not having achieved remission: Secondary | ICD-10-CM | POA: Diagnosis not present

## 2017-08-26 ENCOUNTER — Telehealth: Payer: Self-pay | Admitting: *Deleted

## 2017-08-26 ENCOUNTER — Telehealth: Payer: Self-pay | Admitting: Internal Medicine

## 2017-08-26 DIAGNOSIS — E1165 Type 2 diabetes mellitus with hyperglycemia: Secondary | ICD-10-CM | POA: Diagnosis not present

## 2017-08-26 DIAGNOSIS — R2681 Unsteadiness on feet: Secondary | ICD-10-CM | POA: Diagnosis not present

## 2017-08-26 DIAGNOSIS — I10 Essential (primary) hypertension: Secondary | ICD-10-CM | POA: Diagnosis not present

## 2017-08-26 DIAGNOSIS — D649 Anemia, unspecified: Secondary | ICD-10-CM | POA: Diagnosis not present

## 2017-08-26 DIAGNOSIS — D696 Thrombocytopenia, unspecified: Secondary | ICD-10-CM | POA: Diagnosis not present

## 2017-08-26 DIAGNOSIS — C9590 Leukemia, unspecified not having achieved remission: Secondary | ICD-10-CM | POA: Diagnosis not present

## 2017-08-26 NOTE — Telephone Encounter (Signed)
The patient had syncope with a greater than 20 seconds asystolic episode. He will need pacing. I have called the phone number on the chart. The patient lives with his son. I've asked him to call back to the main number. Pacemaker could either be done tomorrow by Dr. Elliot Cousin i.e. Wednesday or by me on Friday Please let me know when he calls

## 2017-08-26 NOTE — Telephone Encounter (Signed)
Attempted again to reach the patient by phone to discuss PPM implant. No answer at the only contact # in the chart- I left a message for the patient/ his son to please call 1 st thing in the morning.

## 2017-08-26 NOTE — Telephone Encounter (Signed)
Reviewed pause episode with Dr. Caryl Comes, ECG appears >73 seconds asystolic, recommends PPM implant. Dr. Al Corpus for patient's son to return call to schedule PPM implant for Wednesday with Dr. Lovena Le or Friday with Dr. Caryl Comes.

## 2017-08-26 NOTE — Telephone Encounter (Signed)
Spoke with patient's son [name]) regarding pause episode. ECG appears pause with escape beats, lasting 28 seconds. Patient son states he came inside and noticed the patient was "chewing on his tongue and not responding, was hard and cold to touch." He states he could not find a pulse, therefore he hit patient's home emergency button and started compressions; patient responded immediately. [name] states in after a few minutes he was responding to commands and and fully aware of his environment, was just tired. [name] states he gave him peanut butter and crackers due to history of uncontrolled diabetes. Home Health RN arrived at patient's home. CBG was 375. HR was 70's. Advised patient's son to call 911 if patient has another episode of not responding, seizure, passing out, dizziness, lightheadedness, etc. [name] verbalized understanding.   Advised patient's son, Dr. Caryl Comes will be in the office today at 1:30 and will review the episode with him and will call patient's son back with recommendations. [name] states he can't bring the patient to the office until Thursday.

## 2017-08-27 ENCOUNTER — Telehealth: Payer: Self-pay | Admitting: Internal Medicine

## 2017-08-27 DIAGNOSIS — E1165 Type 2 diabetes mellitus with hyperglycemia: Secondary | ICD-10-CM | POA: Diagnosis not present

## 2017-08-27 DIAGNOSIS — I10 Essential (primary) hypertension: Secondary | ICD-10-CM | POA: Diagnosis not present

## 2017-08-27 DIAGNOSIS — D649 Anemia, unspecified: Secondary | ICD-10-CM | POA: Diagnosis not present

## 2017-08-27 DIAGNOSIS — D696 Thrombocytopenia, unspecified: Secondary | ICD-10-CM | POA: Diagnosis not present

## 2017-08-27 DIAGNOSIS — C9590 Leukemia, unspecified not having achieved remission: Secondary | ICD-10-CM | POA: Diagnosis not present

## 2017-08-27 DIAGNOSIS — R2681 Unsteadiness on feet: Secondary | ICD-10-CM | POA: Diagnosis not present

## 2017-08-27 NOTE — Telephone Encounter (Signed)
Scheduled ILR removal (CPT R7229428) & PPM implant (CPT [id number]) for 08/29/17 w/ Dr. Caryl Comes. Pre procedure lab to be done in the hospital the morning of the procedure. Procedure instructions reviewed with pt's dtr, [place].     Arrive at [facility] 7:30 a.m. main entrance     NPO after MN the night before      Take 1/2 usual dose of bedtime insulin the night before     Hold medications the morning of the procedure.     Wash with antibacterial soap night before/morning of Wound check scheduled for 9/19. dtr verbalized understanding and agreeable to plan.

## 2017-08-27 NOTE — Telephone Encounter (Signed)
Spoke with [name] regarding Dr. Aquilla Hacker recommendation for PPM implant. Message sent to Trinidad Curet RN to schedule procedure.

## 2017-08-27 NOTE — Telephone Encounter (Signed)
Aaron Nelson physical therapist called requesting an extension for twice a week for 3 weeks. Please f/up

## 2017-08-27 NOTE — Telephone Encounter (Signed)
Medical Assistant left message on Aaron Nelson's business [employer]. Medical Assistant left message on patient's home and cell voicemail. MA provided VO for extension of twice a week for 3 weeks.

## 2017-08-28 DIAGNOSIS — I10 Essential (primary) hypertension: Secondary | ICD-10-CM | POA: Diagnosis not present

## 2017-08-28 DIAGNOSIS — E1165 Type 2 diabetes mellitus with hyperglycemia: Secondary | ICD-10-CM | POA: Diagnosis not present

## 2017-08-28 DIAGNOSIS — D649 Anemia, unspecified: Secondary | ICD-10-CM | POA: Diagnosis not present

## 2017-08-28 DIAGNOSIS — C9590 Leukemia, unspecified not having achieved remission: Secondary | ICD-10-CM | POA: Diagnosis not present

## 2017-08-28 DIAGNOSIS — D696 Thrombocytopenia, unspecified: Secondary | ICD-10-CM | POA: Diagnosis not present

## 2017-08-28 DIAGNOSIS — R2681 Unsteadiness on feet: Secondary | ICD-10-CM | POA: Diagnosis not present

## 2017-08-29 ENCOUNTER — Encounter (HOSPITAL_COMMUNITY)
Admission: RE | Disposition: A | Discharge status: Home / Self Care | Payer: Self-pay | Source: Ambulatory Visit | Attending: Internal Medicine

## 2017-08-29 ENCOUNTER — Encounter (HOSPITAL_COMMUNITY): Payer: Self-pay | Admitting: Internal Medicine

## 2017-08-29 ENCOUNTER — Ambulatory Visit (HOSPITAL_COMMUNITY)
Admission: RE | Admit: 2017-08-29 | Discharge: 2017-08-30 | Disposition: A | Discharge status: Home / Self Care | Payer: Medicare Other | Source: Ambulatory Visit | Attending: Internal Medicine | Admitting: Internal Medicine

## 2017-08-29 ENCOUNTER — Other Ambulatory Visit: Payer: Self-pay | Admitting: Internal Medicine

## 2017-08-29 DIAGNOSIS — I495 Sick sinus syndrome: Secondary | ICD-10-CM | POA: Insufficient documentation

## 2017-08-29 DIAGNOSIS — D469 Myelodysplastic syndrome, unspecified: Secondary | ICD-10-CM | POA: Insufficient documentation

## 2017-08-29 DIAGNOSIS — Z4509 Encounter for adjustment and management of other cardiac device: Secondary | ICD-10-CM | POA: Insufficient documentation

## 2017-08-29 DIAGNOSIS — D649 Anemia, unspecified: Secondary | ICD-10-CM | POA: Diagnosis present

## 2017-08-29 DIAGNOSIS — I455 Other specified heart block: Secondary | ICD-10-CM | POA: Diagnosis present

## 2017-08-29 DIAGNOSIS — Z959 Presence of cardiac and vascular implant and graft, unspecified: Secondary | ICD-10-CM

## 2017-08-29 DIAGNOSIS — D7581 Myelofibrosis: Secondary | ICD-10-CM | POA: Diagnosis present

## 2017-08-29 DIAGNOSIS — Z7982 Long term (current) use of aspirin: Secondary | ICD-10-CM | POA: Diagnosis not present

## 2017-08-29 DIAGNOSIS — E119 Type 2 diabetes mellitus without complications: Secondary | ICD-10-CM | POA: Insufficient documentation

## 2017-08-29 DIAGNOSIS — I10 Essential (primary) hypertension: Secondary | ICD-10-CM | POA: Insufficient documentation

## 2017-08-29 DIAGNOSIS — IMO0001 Reserved for inherently not codable concepts without codable children: Secondary | ICD-10-CM

## 2017-08-29 DIAGNOSIS — Z950 Presence of cardiac pacemaker: Secondary | ICD-10-CM | POA: Diagnosis not present

## 2017-08-29 DIAGNOSIS — Z794 Long term (current) use of insulin: Secondary | ICD-10-CM | POA: Insufficient documentation

## 2017-08-29 DIAGNOSIS — E1165 Type 2 diabetes mellitus with hyperglycemia: Secondary | ICD-10-CM

## 2017-08-29 DIAGNOSIS — R55 Syncope and collapse: Secondary | ICD-10-CM | POA: Diagnosis present

## 2017-08-29 HISTORY — PX: PACEMAKER IMPLANT: EP1218

## 2017-08-29 HISTORY — DX: Myelodysplastic syndrome, unspecified: D46.9

## 2017-08-29 HISTORY — PX: LOOP RECORDER REMOVAL: EP1215

## 2017-08-29 HISTORY — DX: Other specified heart block: I45.5

## 2017-08-29 HISTORY — DX: Presence of cardiac pacemaker: Z95.0

## 2017-08-29 HISTORY — DX: Syncope and collapse: R55

## 2017-08-29 LAB — SURGICAL PCR SCREEN
MRSA, PCR: NEGATIVE
Staphylococcus aureus: NEGATIVE

## 2017-08-29 LAB — BASIC METABOLIC PANEL
ANION GAP: 8 (ref 5–15)
BUN: 22 mg/dL — ABNORMAL HIGH (ref 6–20)
CALCIUM: 9 mg/dL (ref 8.9–10.3)
CHLORIDE: 104 mmol/L (ref 101–111)
CO2: 24 mmol/L (ref 22–32)
Creatinine, Ser: 1.4 mg/dL — ABNORMAL HIGH (ref 0.61–1.24)
GFR calc Af Amer: 52 mL/min — ABNORMAL LOW (ref >60)
GFR calc non Af Amer: 45 mL/min — ABNORMAL LOW (ref >60)
GLUCOSE: 155 mg/dL — AB (ref 65–99)
Potassium: 5.4 mmol/L — ABNORMAL HIGH (ref 3.5–5.1)
Sodium: 136 mmol/L (ref 135–145)

## 2017-08-29 LAB — CBC
HEMATOCRIT: 29.9 % — AB (ref 39.0–52.0)
HEMOGLOBIN: 9.1 g/dL — AB (ref 13.0–17.0)
MCH: 22.5 pg — AB (ref 26.0–34.0)
MCHC: 30.4 g/dL (ref 30.0–36.0)
MCV: 73.8 fL — AB (ref 78.0–100.0)
PLATELETS: 237 10*3/uL (ref 150–400)
RBC: 4.05 MIL/uL — AB (ref 4.22–5.81)
RDW: 25.9 % — ABNORMAL HIGH (ref 11.5–15.5)
WBC: 3.6 10*3/uL — AB (ref 4.0–10.5)

## 2017-08-29 LAB — GLUCOSE, CAPILLARY
GLUCOSE-CAPILLARY: 314 mg/dL — AB (ref 65–99)
Glucose-Capillary: 148 mg/dL — ABNORMAL HIGH (ref 65–99)
Glucose-Capillary: 154 mg/dL — ABNORMAL HIGH (ref 65–99)
Glucose-Capillary: 271 mg/dL — ABNORMAL HIGH (ref 65–99)
Glucose-Capillary: 296 mg/dL — ABNORMAL HIGH (ref 65–99)

## 2017-08-29 LAB — PROTIME-INR
INR: 1.13
Prothrombin Time: 14.4 seconds (ref 11.4–15.2)

## 2017-08-29 LAB — POTASSIUM: POTASSIUM: 4.7 mmol/L (ref 3.5–5.1)

## 2017-08-29 SURGERY — PACEMAKER IMPLANT

## 2017-08-29 MED ORDER — MUPIROCIN 2 % EX OINT
1.0000 "application " | TOPICAL_OINTMENT | Freq: Once | CUTANEOUS | Status: AC
  Administered 2017-08-29: 1 via TOPICAL

## 2017-08-29 MED ORDER — CEFAZOLIN SODIUM-DEXTROSE 1-4 GM/50ML-% IV SOLN
1.0000 g | Freq: Four times a day (QID) | INTRAVENOUS | Status: AC
  Administered 2017-08-29 – 2017-08-30 (×3): 1 g via INTRAVENOUS
  Filled 2017-08-29 (×3): qty 50

## 2017-08-29 MED ORDER — SODIUM CHLORIDE 0.9 % IR SOLN
Status: AC
  Filled 2017-08-29: qty 2

## 2017-08-29 MED ORDER — SODIUM CHLORIDE 0.9 % IV SOLN: INTRAVENOUS | Status: AC

## 2017-08-29 MED ORDER — MUPIROCIN 2 % EX OINT
TOPICAL_OINTMENT | CUTANEOUS | Status: AC
  Administered 2017-08-29: 1 via TOPICAL
  Filled 2017-08-29: qty 22

## 2017-08-29 MED ORDER — INSULIN ASPART 100 UNIT/ML ~~LOC~~ SOLN
4.0000 [IU] | Freq: Once | SUBCUTANEOUS | Status: AC
  Administered 2017-08-29: 4 [IU] via SUBCUTANEOUS

## 2017-08-29 MED ORDER — AMLODIPINE BESYLATE 2.5 MG PO TABS
2.5000 mg | ORAL_TABLET | Freq: Every day | ORAL | Status: DC
  Administered 2017-08-29 – 2017-08-30 (×2): 2.5 mg via ORAL
  Filled 2017-08-29 (×2): qty 1

## 2017-08-29 MED ORDER — ACETAMINOPHEN 325 MG PO TABS
ORAL_TABLET | ORAL | Status: AC
  Filled 2017-08-29: qty 2

## 2017-08-29 MED ORDER — CEFAZOLIN SODIUM-DEXTROSE 2-4 GM/100ML-% IV SOLN
INTRAVENOUS | Status: AC
  Filled 2017-08-29: qty 100

## 2017-08-29 MED ORDER — HEPARIN (PORCINE) IN NACL 2-0.9 UNIT/ML-% IJ SOLN
INTRAMUSCULAR | Status: AC | PRN
  Administered 2017-08-29: 500 mL

## 2017-08-29 MED ORDER — LIDOCAINE HCL (PF) 1 % IJ SOLN
INTRAMUSCULAR | Status: DC | PRN
Start: 2017-08-29 — End: 2017-08-29
  Administered 2017-08-29: 45 mL
  Administered 2017-08-29: 10 mL

## 2017-08-29 MED ORDER — LABETALOL HCL 5 MG/ML IV SOLN
INTRAVENOUS | Status: AC
  Filled 2017-08-29: qty 4

## 2017-08-29 MED ORDER — CHLORHEXIDINE GLUCONATE 4 % EX LIQD: 60.0000 mL | Freq: Once | CUTANEOUS | Status: DC

## 2017-08-29 MED ORDER — HEPARIN (PORCINE) IN NACL 2-0.9 UNIT/ML-% IJ SOLN
INTRAMUSCULAR | Status: AC
  Filled 2017-08-29: qty 500

## 2017-08-29 MED ORDER — SODIUM CHLORIDE 0.9 % IV SOLN
INTRAVENOUS | Status: DC
  Administered 2017-08-29: 09:00:00 via INTRAVENOUS

## 2017-08-29 MED ORDER — LIDOCAINE HCL (PF) 1 % IJ SOLN
INTRAMUSCULAR | Status: AC
  Filled 2017-08-29: qty 60

## 2017-08-29 MED ORDER — ONDANSETRON HCL 4 MG/2ML IJ SOLN
4.0000 mg | Freq: Four times a day (QID) | INTRAMUSCULAR | Status: DC | PRN
Start: 2017-08-29 — End: 2017-08-30

## 2017-08-29 MED ORDER — ACETAMINOPHEN 325 MG PO TABS
325.0000 mg | ORAL_TABLET | ORAL | Status: DC | PRN
  Administered 2017-08-29 – 2017-08-30 (×3): 650 mg via ORAL
  Filled 2017-08-29 (×2): qty 2

## 2017-08-29 MED ORDER — INSULIN ASPART 100 UNIT/ML ~~LOC~~ SOLN: 0.0000 [IU] | Freq: Three times a day (TID) | SUBCUTANEOUS | Status: DC

## 2017-08-29 MED ORDER — ASPIRIN EC 81 MG PO TBEC
81.0000 mg | DELAYED_RELEASE_TABLET | Freq: Every day | ORAL | Status: DC
  Administered 2017-08-29 – 2017-08-30 (×2): 81 mg via ORAL
  Filled 2017-08-29 (×2): qty 1

## 2017-08-29 MED ORDER — INSULIN ASPART 100 UNIT/ML ~~LOC~~ SOLN
0.0000 [IU] | Freq: Three times a day (TID) | SUBCUTANEOUS | Status: DC
  Administered 2017-08-29: 5 [IU] via SUBCUTANEOUS
  Administered 2017-08-30: 2 [IU] via SUBCUTANEOUS
  Administered 2017-08-30: 5 [IU] via SUBCUTANEOUS

## 2017-08-29 MED ORDER — MORPHINE SULFATE (PF) 2 MG/ML IV SOLN
2.0000 mg | INTRAVENOUS | Status: DC | PRN
  Administered 2017-08-29: 2 mg via INTRAVENOUS
  Filled 2017-08-29: qty 1

## 2017-08-29 MED ORDER — GENTAMICIN SULFATE 40 MG/ML IJ SOLN
80.0000 mg | INTRAMUSCULAR | Status: AC
  Administered 2017-08-29: 80 mg

## 2017-08-29 MED ORDER — CHLORHEXIDINE GLUCONATE 0.12 % MT SOLN
10.0000 mL | Freq: Two times a day (BID) | OROMUCOSAL | Status: DC
  Administered 2017-08-29 – 2017-08-30 (×2): 10 mL via OROMUCOSAL
  Filled 2017-08-29 (×2): qty 15

## 2017-08-29 MED ORDER — LABETALOL HCL 5 MG/ML IV SOLN
20.0000 mg | Freq: Once | INTRAVENOUS | Status: AC
  Administered 2017-08-29: 20 mg via INTRAVENOUS
  Filled 2017-08-29: qty 4

## 2017-08-29 MED ORDER — LABETALOL HCL 5 MG/ML IV SOLN
INTRAVENOUS | Status: DC | PRN
  Administered 2017-08-29: 20 mg via INTRAVENOUS

## 2017-08-29 MED ORDER — CEFAZOLIN SODIUM-DEXTROSE 2-4 GM/100ML-% IV SOLN
2.0000 g | INTRAVENOUS | Status: AC
  Administered 2017-08-29: 2 g via INTRAVENOUS

## 2017-08-29 SURGICAL SUPPLY — 8 items
CABLE SURGICAL S-101-97-12 (CABLE) ×3 IMPLANT
HEMOSTAT SURGICEL 2X4 FIBR (HEMOSTASIS) ×3 IMPLANT
LEAD ISOFLEX OPT 1944-52CM (Lead) ×3 IMPLANT
LEAD ISOFLEX OPT 1948-58CM (Lead) ×3 IMPLANT
PACEMAKER ASSURITY DR-RF (Pacemaker) ×3 IMPLANT
PAD DEFIB LIFELINK (PAD) ×3 IMPLANT
SHEATH CLASSIC 7F (SHEATH) ×6 IMPLANT
TRAY PACEMAKER INSERTION (PACKS) ×3 IMPLANT

## 2017-08-29 NOTE — H&P (Signed)
History and Physical   Patient ID: Aaron Nelson, MRN: [id number], DOB: 1930/01/09   Date of Encounter: 08/29/2017, 10:07 AM  Primary Care Provider: Tresa Garter, MD Cardiologist: sk Electrophysiologist:  sk  Chief Complaint:  Syncope and pauses  History of Present Illness: Aaron Nelson is a 83 y.o. male  Seen for dizziness and syncope for which a loop recorder was implanted in [place]   His hx was suggestive of OI and measures were reviewed.  No arrhythmias were noted when pt initally seen  More recently however 8/18 4 sec sinus arrest pause recorded at 20 h On Sunday after church had syncopal spell assoc with seizure like activity  Loop interrogation demonstrated a 20 sec pause    Past Medical History:  Diagnosis Date  . Diabetes mellitus without complication ([place])   . Hypertension   . Myelodysplasia (myelodysplastic syndrome) ([place])   . Sinus arrest   . Syncope      Past Surgical History:  Procedure Laterality Date  . BONE MARROW BIOPSY  2017  . PORTACATH PLACEMENT  2017      Prior to Admission medications   Medication Sig Start Date End Date Taking? Authorizing Provider  amLODipine (NORVASC) 2.5 MG tablet Take 1 tablet (2.5 mg total) by mouth daily. 07/23/17  Yes Tresa Garter, MD  aspirin EC 81 MG tablet Take 81 mg by mouth daily.   Yes [provider]  chlorhexidine (PERIDEX) 0.12 % solution Use as directed 10 mLs in the mouth or throat 2 (two) times daily. 08/08/17  Yes Barnet Glasgow, NP  Insulin Glargine (LANTUS SOLOSTAR) 100 UNIT/ML Solostar Pen Inject 40 Units into the skin daily at 10 pm. Patient taking differently: Inject 40 Units into the skin every morning.  07/23/17  Yes Jegede, Olugbemiga E, MD  lidocaine (XYLOCAINE) 2 % solution Apply small amount to painful gums with a qtip as needed.  Avoid swallowing 08/13/17  Yes Freeman Caldron M, PA-C  insulin aspart (NOVOLOG FLEXPEN) 100 UNIT/ML FlexPen Inject 10 units with two largest meals of  the day Patient not taking: Reported on 08/27/2017 07/23/17   Tresa Garter, MD      Allergies: No Known Allergies   Social History:  The patient  reports that he has never smoked. He has never used smokeless tobacco. He reports that he does not drink alcohol or use drugs.   Family History:  The patient's family history is not on file.   ROS:  Please see the history of present illness.      All other systems reviewed and negative.   Vital Signs: Blood pressure (!) 173/79, pulse 63, temperature 97.9 F (36.6 C), temperature source Oral, height '5\' 5"'  (1.651 m), weight 148 lb (67.1 kg), SpO2 100 %.  PHYSICAL EXAM: Well developed and nourished in no acute distress HENT normal Neck supple with JVP-flat Carotids brisk and full without bruits Clear Regular rate and rhythm, no murmurs or gallops Abd-soft with active BS without hepatomegaly No Clubbing cyanosis edema Skin-warm and dry A & Oriented  Grossly normal sensory and motor function    EKG:  07/16/17  Sinus with narrow QRS  Labs:   Lab Results  Component Value Date   WBC PENDING 08/29/2017   HGB 9.1 (L) 08/29/2017   HCT 29.9 (L) 08/29/2017   MCV 73.8 (L) 08/29/2017   PLT PENDING 08/29/2017      Recent Labs Lab 08/29/17 0756  NA 136  K 5.4*  CL 104  CO2 24  BUN 22*  CREATININE 1.40*  CALCIUM 9.0  GLUCOSE 155*    No results for input(s): CKTOTAL, CKMB, TROPONINI in the last 72 hours.  Lab Results  Component Value Date   CHOL 157 06/11/2017   HDL 59 06/11/2017   LDLCALC 73 06/11/2017   TRIG 124 06/11/2017    No results found for: DDIMER   Radiology/Studies:   No results found.    ASSESSMENT AND PLAN:   1. Syncope with sinus node arrest 2. Myelodysplastic syndrome 3. Implantable loop recorder  Pt has syncope with documented prolonged sinus arrest.  Pacing is indicated  The benefits and risks were reviewed including but not limited to death,  perforation, infection, lead dislodgement and  device malfunction.  The patient understands agrees and is willing to proceed.  Will also remove loop recorder   Signed,  Virl Axe 08/29/2017 10:07 AM  Pager # [phone]

## 2017-08-30 ENCOUNTER — Ambulatory Visit (HOSPITAL_COMMUNITY): Payer: Medicare Other

## 2017-08-30 ENCOUNTER — Encounter (HOSPITAL_COMMUNITY): Payer: Self-pay | Admitting: Cardiology

## 2017-08-30 DIAGNOSIS — I455 Other specified heart block: Secondary | ICD-10-CM | POA: Diagnosis present

## 2017-08-30 DIAGNOSIS — D469 Myelodysplastic syndrome, unspecified: Secondary | ICD-10-CM | POA: Diagnosis not present

## 2017-08-30 DIAGNOSIS — Z7982 Long term (current) use of aspirin: Secondary | ICD-10-CM | POA: Diagnosis not present

## 2017-08-30 DIAGNOSIS — Z4509 Encounter for adjustment and management of other cardiac device: Secondary | ICD-10-CM | POA: Diagnosis not present

## 2017-08-30 DIAGNOSIS — E119 Type 2 diabetes mellitus without complications: Secondary | ICD-10-CM | POA: Diagnosis not present

## 2017-08-30 DIAGNOSIS — D7581 Myelofibrosis: Secondary | ICD-10-CM | POA: Diagnosis not present

## 2017-08-30 DIAGNOSIS — Z95810 Presence of automatic (implantable) cardiac defibrillator: Secondary | ICD-10-CM | POA: Diagnosis not present

## 2017-08-30 DIAGNOSIS — I10 Essential (primary) hypertension: Secondary | ICD-10-CM | POA: Diagnosis not present

## 2017-08-30 DIAGNOSIS — R55 Syncope and collapse: Secondary | ICD-10-CM

## 2017-08-30 DIAGNOSIS — Z950 Presence of cardiac pacemaker: Secondary | ICD-10-CM

## 2017-08-30 DIAGNOSIS — Z794 Long term (current) use of insulin: Secondary | ICD-10-CM | POA: Diagnosis not present

## 2017-08-30 DIAGNOSIS — I495 Sick sinus syndrome: Secondary | ICD-10-CM | POA: Diagnosis not present

## 2017-08-30 HISTORY — DX: Syncope and collapse: R55

## 2017-08-30 HISTORY — DX: Presence of cardiac pacemaker: Z95.0

## 2017-08-30 LAB — GLUCOSE, CAPILLARY
GLUCOSE-CAPILLARY: 279 mg/dL — AB (ref 65–99)
Glucose-Capillary: 192 mg/dL — ABNORMAL HIGH (ref 65–99)

## 2017-08-30 MED ORDER — INSULIN GLARGINE 100 UNIT/ML SOLOSTAR PEN: 40.0000 [IU] | PEN_INJECTOR | SUBCUTANEOUS | Status: DC

## 2017-08-30 MED ORDER — ACETAMINOPHEN 325 MG PO TABS: 625.0000 mg | ORAL_TABLET | ORAL | Status: AC | PRN

## 2017-08-30 NOTE — Discharge Summary (Signed)
Discharge Summary    Patient ID: Aaron Nelson,  MRN: [id number], DOB/AGE: 1930-07-07 83 y.o.  Admit date: 08/29/2017 Discharge date: 08/30/2017  Primary Care Provider: Tresa Garter Primary Cardiologist: Dr. Caryl Comes  Discharge Diagnoses    Principal Problem:   Syncope and collapse Active Problems:   Sinus arrest 20 sec with syncope   S/P placement of cardiac pacemaker   Anemia   Essential hypertension   Uncontrolled type 2 diabetes mellitus without complication, with long-term current use of insulin (HCC)   Myelofibrosis (HCC)   Sinus node dysfunction (HCC)   Allergies No Known Allergies  Diagnostic Studies/Procedures    Placement of St Jude PPM 08/29/17 by Dr. Lovena Le and removal of LINQ. _____________   History of Present Illness      83 y.o. male  Seen for dizziness and syncope for which a loop recorder was implanted in [place]   His hx was suggestive of OI and measures were reviewed.  No arrhythmias were noted when pt initally seen  More recently however 8/18 4 sec sinus arrest pause recorded at 20 h On Sunday after church had syncopal spell assoc with seizure like activity  Loop interrogation demonstrated a 20 sec pause Dr. Caryl Comes was notified and after discussing with pt and family plans were made for [place]     Consultants: none  Pt had St Jude generator placed and LINQ removed on 08/29/17.  He did well post op 08/30/17 he was seen by Dr. Rayann Heman and found stable for discharge.  His device was interrogated and reviewed by Dr. Rayann Heman and normal.  CXR revealed stable leads and no pneumothorax.  Pt was using tylenol for pain.  He will follow up as below.  Discharged to home. _____________  Discharge Vitals Blood pressure 114/64, pulse 66, temperature (!) 97.4 F (36.3 C), temperature source Oral, resp. rate 18, height 5\' 5"  (1.651 m), weight 142 lb 9.6 oz (64.7 kg), SpO2 99 %.  Filed Weights   08/29/17 0738 08/29/17 1530 08/30/17 0444    Weight: 148 lb (67.1 kg) 145 lb 4.8 oz (65.9 kg) 142 lb 9.6 oz (64.7 kg)    Labs & Radiologic Studies    CBC  Recent Labs  08/29/17 0756  WBC 3.6*  HGB 9.1*  HCT 29.9*  MCV 73.8*  PLT 465   Basic Metabolic Panel  Recent Labs  08/29/17 0756 08/29/17 0926  NA 136  --   K 5.4* 4.7  CL 104  --   CO2 24  --   GLUCOSE 155*  --   BUN 22*  --   CREATININE 1.40*  --   CALCIUM 9.0  --    Liver Function Tests No results for input(s): AST, ALT, ALKPHOS, BILITOT, PROT, ALBUMIN in the last 72 hours. No results for input(s): LIPASE, AMYLASE in the last 72 hours. Cardiac Enzymes No results for input(s): CKTOTAL, CKMB, CKMBINDEX, TROPONINI in the last 72 hours. BNP Invalid input(s): POCBNP D-Dimer No results for input(s): DDIMER in the last 72 hours. Hemoglobin A1C No results for input(s): HGBA1C in the last 72 hours. Fasting Lipid Panel No results for input(s): CHOL, HDL, LDLCALC, TRIG, CHOLHDL, LDLDIRECT in the last 72 hours. Thyroid Function Tests No results for input(s): TSH, T4TOTAL, T3FREE, THYROIDAB in the last 72 hours.  Invalid input(s): FREET3 _____________  Dg Chest 2 View  Result Date: 08/30/2017 CLINICAL DATA:  Status post defibrillator placement. EXAM: CHEST  2 VIEW COMPARISON:  Radiographs of  July 15, 2017. FINDINGS: The heart size and mediastinal contours are within normal limits. Both lungs are clear. No pneumothorax or pleural effusion is noted. Interval placement of left-sided pacemaker with leads in grossly good position. The visualized skeletal structures are unremarkable. IMPRESSION: No active cardiopulmonary disease. Interval placement of left-sided pacemaker with leads in grossly good position. No pneumothorax is noted. Electronically Signed   By: Marijo Conception, M.D.   On: 08/30/2017 07:35   Disposition   Pt is being discharged home today in good condition.  Follow-up Plans & Appointments    Follow-up Information    [facility]  Office Follow up on 09/10/2017.   Specialty:  Cardiology Why:  at 3:00 PM for wound check  Contact information: [address], [address] [place]       Deboraha Sprang, MD Follow up.   Specialty:  Cardiology Why:  the office will call with date and time Contact information: [address] [address] [phone]           Heart Healthy Diabetic Diet  Call if any questions.   Pacemaker instructions given  Discharge Medications   Current Discharge Medication List    START taking these medications   Details  acetaminophen (TYLENOL) 325 MG tablet Take 2 tablets (650 mg total) by mouth every 4 (four) hours as needed for mild pain.   Associated Diagnoses: Uncontrolled type 2 diabetes mellitus without complication, with long-term current use of insulin ([place])      CONTINUE these medications which have CHANGED   Details  Insulin Glargine (LANTUS SOLOSTAR) 100 UNIT/ML Solostar Pen Inject 40 Units into the skin every morning.   Associated Diagnoses: Uncontrolled type 2 diabetes mellitus without complication, with long-term current use of insulin ([place])      CONTINUE these medications which have NOT CHANGED   Details  amLODipine (NORVASC) 2.5 MG tablet Take 1 tablet (2.5 mg total) by mouth daily. Qty: 90 tablet, Refills: 3   Associated Diagnoses: Essential hypertension    aspirin EC 81 MG tablet Take 81 mg by mouth daily.    lidocaine (XYLOCAINE) 2 % solution Apply small amount to painful gums with a qtip as needed.  Avoid swallowing Qty: 100 mL, Refills: 0   Associated Diagnoses: Pain in gums      STOP taking these medications     chlorhexidine (PERIDEX) 0.12 % solution      insulin aspart (NOVOLOG FLEXPEN) 100 UNIT/ML FlexPen            Outstanding Labs/Studies     Duration of Discharge Encounter   Greater than 30 minutes including physician time.  Signed, Cecilie Kicks NP 08/30/2017,  1:18 PM   Thompson Grayer MD, [facility] 08/30/2017 10:44 PM

## 2017-08-30 NOTE — Progress Notes (Signed)
Patient's blood sugar was increased and patient experiencing pain at surgical site. Called MD for orders.

## 2017-08-30 NOTE — Progress Notes (Signed)
Discharge instructions reviewed with patient, son and daughter.  Questions answered, verbalized understanding.  Went over all precautions after pacemaker placement with family and monitoring box sent home with them.  Patient transported to front of hospital via wheelchair to be taken home by family.

## 2017-08-30 NOTE — Progress Notes (Signed)
Doing well s/p PPM implantation by Dr Caryl Comes Device interrogation is reviewed and normal  CXR reveals stable leads, no ptx  DC to home Routine wound care and follow-up  Thompson Grayer MD, [facility] 08/30/2017 11:39 AM

## 2017-08-30 NOTE — Discharge Instructions (Signed)
° ° °  Supplemental Discharge Instructions for  Pacemaker Patients  Activity No heavy lifting or vigorous activity with your left arm for 6 to 8 weeks.  Do not raise your left arm above your head for one week.  Gradually raise your affected arm as drawn below.           __9/8/18                            09/01/17                  09/03/17                  09/05/17  NO DRIVING for  1   ; you may begin driving on  1/63/84 if you drive   .  WOUND CARE - Keep the wound area clean and dry.  Do not get this area wet for one week. No showers for one week; you may shower on  09/04/17   . - The tape/steri-strips on your wound will fall off; do not pull them off.  No bandage is needed on the site.  DO  NOT apply any creams, oils, or ointments to the wound area. - If you notice any drainage or discharge from the wound, any swelling or bruising at the site, or you develop a fever > 101? F after you are discharged home, call the office at once.  Special Instructions - You are still able to use cellular telephones; use the ear opposite the side where you have your pacemaker/defibrillator.  Avoid carrying your cellular phone near your device. - When traveling through airports, show security personnel your identification card to avoid being screened in the metal detectors.  Ask the security personnel to use the hand wand. - Avoid arc welding equipment, MRI testing (magnetic resonance imaging), TENS units (transcutaneous nerve stimulators).  Call the office for questions about other devices. - Avoid electrical appliances that are in poor condition or are not properly grounded. - Microwave ovens are safe to be near or to operate.   Heart Healthy Diabetic Diet  Call if any questions.

## 2017-09-01 ENCOUNTER — Encounter (HOSPITAL_COMMUNITY): Payer: Self-pay | Admitting: Internal Medicine

## 2017-09-02 DIAGNOSIS — C9590 Leukemia, unspecified not having achieved remission: Secondary | ICD-10-CM | POA: Diagnosis not present

## 2017-09-02 DIAGNOSIS — D649 Anemia, unspecified: Secondary | ICD-10-CM | POA: Diagnosis not present

## 2017-09-02 DIAGNOSIS — D696 Thrombocytopenia, unspecified: Secondary | ICD-10-CM | POA: Diagnosis not present

## 2017-09-02 DIAGNOSIS — R2681 Unsteadiness on feet: Secondary | ICD-10-CM | POA: Diagnosis not present

## 2017-09-02 DIAGNOSIS — E1165 Type 2 diabetes mellitus with hyperglycemia: Secondary | ICD-10-CM | POA: Diagnosis not present

## 2017-09-02 DIAGNOSIS — I10 Essential (primary) hypertension: Secondary | ICD-10-CM | POA: Diagnosis not present

## 2017-09-04 DIAGNOSIS — E1165 Type 2 diabetes mellitus with hyperglycemia: Secondary | ICD-10-CM | POA: Diagnosis not present

## 2017-09-04 DIAGNOSIS — I10 Essential (primary) hypertension: Secondary | ICD-10-CM | POA: Diagnosis not present

## 2017-09-04 DIAGNOSIS — C9590 Leukemia, unspecified not having achieved remission: Secondary | ICD-10-CM | POA: Diagnosis not present

## 2017-09-04 DIAGNOSIS — D649 Anemia, unspecified: Secondary | ICD-10-CM | POA: Diagnosis not present

## 2017-09-04 DIAGNOSIS — R2681 Unsteadiness on feet: Secondary | ICD-10-CM | POA: Diagnosis not present

## 2017-09-04 DIAGNOSIS — D696 Thrombocytopenia, unspecified: Secondary | ICD-10-CM | POA: Diagnosis not present

## 2017-09-08 DIAGNOSIS — D649 Anemia, unspecified: Secondary | ICD-10-CM | POA: Diagnosis not present

## 2017-09-08 DIAGNOSIS — D696 Thrombocytopenia, unspecified: Secondary | ICD-10-CM | POA: Diagnosis not present

## 2017-09-08 DIAGNOSIS — I10 Essential (primary) hypertension: Secondary | ICD-10-CM | POA: Diagnosis not present

## 2017-09-08 DIAGNOSIS — E1165 Type 2 diabetes mellitus with hyperglycemia: Secondary | ICD-10-CM | POA: Diagnosis not present

## 2017-09-08 DIAGNOSIS — C9590 Leukemia, unspecified not having achieved remission: Secondary | ICD-10-CM | POA: Diagnosis not present

## 2017-09-08 DIAGNOSIS — R2681 Unsteadiness on feet: Secondary | ICD-10-CM | POA: Diagnosis not present

## 2017-09-09 DIAGNOSIS — R2681 Unsteadiness on feet: Secondary | ICD-10-CM | POA: Diagnosis not present

## 2017-09-09 DIAGNOSIS — D649 Anemia, unspecified: Secondary | ICD-10-CM | POA: Diagnosis not present

## 2017-09-09 DIAGNOSIS — C9590 Leukemia, unspecified not having achieved remission: Secondary | ICD-10-CM | POA: Diagnosis not present

## 2017-09-09 DIAGNOSIS — D696 Thrombocytopenia, unspecified: Secondary | ICD-10-CM | POA: Diagnosis not present

## 2017-09-09 DIAGNOSIS — E1165 Type 2 diabetes mellitus with hyperglycemia: Secondary | ICD-10-CM | POA: Diagnosis not present

## 2017-09-09 DIAGNOSIS — I10 Essential (primary) hypertension: Secondary | ICD-10-CM | POA: Diagnosis not present

## 2017-09-10 ENCOUNTER — Telehealth: Payer: Self-pay | Admitting: Internal Medicine

## 2017-09-10 ENCOUNTER — Ambulatory Visit (INDEPENDENT_AMBULATORY_CARE_PROVIDER_SITE_OTHER): Payer: Medicare Other | Admitting: *Deleted

## 2017-09-10 ENCOUNTER — Telehealth: Payer: Self-pay | Admitting: Cardiology

## 2017-09-10 DIAGNOSIS — C9590 Leukemia, unspecified not having achieved remission: Secondary | ICD-10-CM | POA: Diagnosis not present

## 2017-09-10 DIAGNOSIS — D649 Anemia, unspecified: Secondary | ICD-10-CM | POA: Diagnosis not present

## 2017-09-10 DIAGNOSIS — D696 Thrombocytopenia, unspecified: Secondary | ICD-10-CM | POA: Diagnosis not present

## 2017-09-10 DIAGNOSIS — I495 Sick sinus syndrome: Secondary | ICD-10-CM

## 2017-09-10 DIAGNOSIS — I10 Essential (primary) hypertension: Secondary | ICD-10-CM | POA: Diagnosis not present

## 2017-09-10 DIAGNOSIS — R2681 Unsteadiness on feet: Secondary | ICD-10-CM | POA: Diagnosis not present

## 2017-09-10 DIAGNOSIS — E1165 Type 2 diabetes mellitus with hyperglycemia: Secondary | ICD-10-CM | POA: Diagnosis not present

## 2017-09-10 NOTE — Telephone Encounter (Signed)
Spoke w/ pt and requested that she send a manual transmission b/c her home monitor has not updated in at least 14 days.   

## 2017-09-10 NOTE — Telephone Encounter (Signed)
Aaron Nelson from [facility] called requesting a verbal order for PT to recert beginning  3/74 for 4 weeks. Please f/up

## 2017-09-11 ENCOUNTER — Encounter: Payer: Medicare Other | Admitting: Physician Assistant

## 2017-09-11 DIAGNOSIS — R2681 Unsteadiness on feet: Secondary | ICD-10-CM | POA: Diagnosis not present

## 2017-09-11 DIAGNOSIS — D649 Anemia, unspecified: Secondary | ICD-10-CM | POA: Diagnosis not present

## 2017-09-11 DIAGNOSIS — E1165 Type 2 diabetes mellitus with hyperglycemia: Secondary | ICD-10-CM | POA: Diagnosis not present

## 2017-09-11 DIAGNOSIS — C9590 Leukemia, unspecified not having achieved remission: Secondary | ICD-10-CM | POA: Diagnosis not present

## 2017-09-11 DIAGNOSIS — D696 Thrombocytopenia, unspecified: Secondary | ICD-10-CM | POA: Diagnosis not present

## 2017-09-11 DIAGNOSIS — I10 Essential (primary) hypertension: Secondary | ICD-10-CM | POA: Diagnosis not present

## 2017-09-11 LAB — CUP PACEART INCLINIC DEVICE CHECK
Brady Statistic RV Percent Paced: 0 %
Implantable Lead Implant Date: 20180907
Implantable Lead Location: 753859
Implantable Lead Model: 1944
Implantable Lead Model: 1948
Implantable Pulse Generator Implant Date: 20180907
Lead Channel Impedance Value: 662.5 Ohm
Lead Channel Pacing Threshold Amplitude: 0.75 V
Lead Channel Pacing Threshold Amplitude: 0.75 V
Lead Channel Pacing Threshold Pulse Width: 0.4 ms
Lead Channel Sensing Intrinsic Amplitude: 2.5 mV
Lead Channel Setting Pacing Amplitude: 3.5 V
Lead Channel Setting Pacing Amplitude: 3.5 V
Lead Channel Setting Pacing Pulse Width: 0.4 ms
MDC IDC LEAD IMPLANT DT: 20180907
MDC IDC LEAD LOCATION: 753860
MDC IDC MSMT BATTERY REMAINING LONGEVITY: 144 mo
MDC IDC MSMT BATTERY VOLTAGE: 3.1 V
MDC IDC MSMT LEADCHNL RA IMPEDANCE VALUE: 450 Ohm
MDC IDC MSMT LEADCHNL RA PACING THRESHOLD AMPLITUDE: 0.75 V
MDC IDC MSMT LEADCHNL RA PACING THRESHOLD PULSEWIDTH: 0.4 ms
MDC IDC MSMT LEADCHNL RA PACING THRESHOLD PULSEWIDTH: 0.4 ms
MDC IDC MSMT LEADCHNL RV PACING THRESHOLD AMPLITUDE: 0.75 V
MDC IDC MSMT LEADCHNL RV PACING THRESHOLD PULSEWIDTH: 0.4 ms
MDC IDC MSMT LEADCHNL RV SENSING INTR AMPL: 11.3 mV
MDC IDC SESS DTM: 20180919192824
MDC IDC SET LEADCHNL RV SENSING SENSITIVITY: 2 mV
MDC IDC STAT BRADY RA PERCENT PACED: 5.9 %
Pulse Gen Model: 2272
Pulse Gen Serial Number: 8939912

## 2017-09-11 NOTE — Progress Notes (Signed)
Wound check appointment. Dermabond removed. Wound without redness or edema. Incision edges approximated, wound well healed. Normal device function. Thresholds, sensing, and impedances consistent with implant measurements. Device programmed at 3.5V for extra safety margin until 3 month visit. Histogram distribution appropriate for patient and level of activity. No mode switches or high ventricular rates noted. Patient educated about wound care, arm mobility, lifting restrictions, and remote monitoring. ROV in 3 months with SK.

## 2017-09-11 NOTE — Telephone Encounter (Signed)
[name] from [facility] contacted the office to request order to see patient twice a week for 2 weeks then once a week for 7 weeks these will be for medical management. Aaron Nelson states that he had a pacemaker placed last week. She is also requesting home health aide for abl and bathing assistance twice a week for 9 weeks.   Aaron Nelson is requesting a call back at [phone] if she doesn't answer you can leave a voicemail giving permission for orders

## 2017-09-11 NOTE — Telephone Encounter (Signed)
MA provided VO for PT and home health to begin for ADL'S and medication management. Medical Assistant left message on Aaron Nelson's cell voicemail. Voicemail states to give a call back to [place] with [facility] at [phone].

## 2017-09-16 ENCOUNTER — Encounter (HOSPITAL_COMMUNITY): Payer: Self-pay | Admitting: Neurology

## 2017-09-16 ENCOUNTER — Emergency Department (HOSPITAL_COMMUNITY)
Admission: EM | Admit: 2017-09-16 | Discharge: 2017-09-16 | Disposition: A | Discharge status: Home / Self Care | Payer: Medicare Other | Attending: Emergency Medicine | Admitting: Emergency Medicine

## 2017-09-16 ENCOUNTER — Telehealth: Payer: Self-pay | Admitting: Internal Medicine

## 2017-09-16 ENCOUNTER — Emergency Department (HOSPITAL_COMMUNITY): Payer: Medicare Other

## 2017-09-16 DIAGNOSIS — R0602 Shortness of breath: Secondary | ICD-10-CM | POA: Diagnosis not present

## 2017-09-16 DIAGNOSIS — R079 Chest pain, unspecified: Secondary | ICD-10-CM | POA: Diagnosis not present

## 2017-09-16 DIAGNOSIS — E119 Type 2 diabetes mellitus without complications: Secondary | ICD-10-CM | POA: Diagnosis not present

## 2017-09-16 DIAGNOSIS — R0789 Other chest pain: Secondary | ICD-10-CM | POA: Diagnosis not present

## 2017-09-16 DIAGNOSIS — I208 Other forms of angina pectoris: Secondary | ICD-10-CM

## 2017-09-16 DIAGNOSIS — E1165 Type 2 diabetes mellitus with hyperglycemia: Secondary | ICD-10-CM | POA: Diagnosis not present

## 2017-09-16 DIAGNOSIS — D696 Thrombocytopenia, unspecified: Secondary | ICD-10-CM | POA: Diagnosis not present

## 2017-09-16 DIAGNOSIS — I10 Essential (primary) hypertension: Secondary | ICD-10-CM | POA: Insufficient documentation

## 2017-09-16 DIAGNOSIS — J449 Chronic obstructive pulmonary disease, unspecified: Secondary | ICD-10-CM | POA: Diagnosis not present

## 2017-09-16 DIAGNOSIS — Z7982 Long term (current) use of aspirin: Secondary | ICD-10-CM | POA: Diagnosis not present

## 2017-09-16 DIAGNOSIS — Z950 Presence of cardiac pacemaker: Secondary | ICD-10-CM | POA: Insufficient documentation

## 2017-09-16 DIAGNOSIS — Z79899 Other long term (current) drug therapy: Secondary | ICD-10-CM | POA: Insufficient documentation

## 2017-09-16 DIAGNOSIS — Z794 Long term (current) use of insulin: Secondary | ICD-10-CM | POA: Diagnosis not present

## 2017-09-16 DIAGNOSIS — Z48812 Encounter for surgical aftercare following surgery on the circulatory system: Secondary | ICD-10-CM | POA: Diagnosis not present

## 2017-09-16 DIAGNOSIS — C9590 Leukemia, unspecified not having achieved remission: Secondary | ICD-10-CM | POA: Diagnosis not present

## 2017-09-16 LAB — BASIC METABOLIC PANEL
Anion gap: 6 (ref 5–15)
BUN: 20 mg/dL (ref 6–20)
CHLORIDE: 107 mmol/L (ref 101–111)
CO2: 23 mmol/L (ref 22–32)
CREATININE: 1.31 mg/dL — AB (ref 0.61–1.24)
Calcium: 8.9 mg/dL (ref 8.9–10.3)
GFR, EST AFRICAN AMERICAN: 56 mL/min — AB (ref >60)
GFR, EST NON AFRICAN AMERICAN: 48 mL/min — AB (ref >60)
Glucose, Bld: 210 mg/dL — ABNORMAL HIGH (ref 65–99)
POTASSIUM: 4.4 mmol/L (ref 3.5–5.1)
SODIUM: 136 mmol/L (ref 135–145)

## 2017-09-16 LAB — I-STAT TROPONIN, ED: Troponin i, poc: 0 ng/mL (ref 0.00–0.08)

## 2017-09-16 LAB — CBC
HCT: 30 % — ABNORMAL LOW (ref 39.0–52.0)
Hemoglobin: 9.5 g/dL — ABNORMAL LOW (ref 13.0–17.0)
MCH: 23.1 pg — ABNORMAL LOW (ref 26.0–34.0)
MCHC: 31.7 g/dL (ref 30.0–36.0)
MCV: 72.8 fL — AB (ref 78.0–100.0)
PLATELETS: 222 10*3/uL (ref 150–400)
RBC: 4.12 MIL/uL — AB (ref 4.22–5.81)
RDW: 25.8 % — ABNORMAL HIGH (ref 11.5–15.5)
WBC: 4.1 10*3/uL (ref 4.0–10.5)

## 2017-09-16 LAB — D-DIMER, QUANTITATIVE: D-Dimer, Quant: 1.79 ug/mL-FEU — ABNORMAL HIGH (ref 0.00–0.50)

## 2017-09-16 LAB — TROPONIN I

## 2017-09-16 LAB — BRAIN NATRIURETIC PEPTIDE: B NATRIURETIC PEPTIDE 5: 45.3 pg/mL (ref 0.0–100.0)

## 2017-09-16 MED ORDER — ASPIRIN 81 MG PO CHEW
324.0000 mg | CHEWABLE_TABLET | Freq: Once | ORAL | Status: DC
  Filled 2017-09-16: qty 4

## 2017-09-16 MED ORDER — NITROSTAT 0.4 MG SL SUBL: 0.4000 mg | SUBLINGUAL_TABLET | SUBLINGUAL | 3 refills | Status: AC | PRN

## 2017-09-16 MED ORDER — IOPAMIDOL (ISOVUE-370) INJECTION 76%
INTRAVENOUS | Status: AC
  Administered 2017-09-16: 100 mL
  Filled 2017-09-16: qty 100

## 2017-09-16 MED ORDER — ISOSORBIDE MONONITRATE ER 30 MG PO TB24: 30.0000 mg | ORAL_TABLET | Freq: Every day | ORAL | 11 refills | Status: DC

## 2017-09-16 MED ORDER — ISOSORBIDE MONONITRATE ER 30 MG PO TB24
30.0000 mg | ORAL_TABLET | Freq: Every day | ORAL | Status: DC
  Administered 2017-09-16: 30 mg via ORAL
  Filled 2017-09-16: qty 1

## 2017-09-16 NOTE — Telephone Encounter (Signed)
Received call from [facility] who is currently with the patient  Patient has been having chest pains, tightness and shortness of breath off and on since Saturday. Patient is currently experiencing shortness of breath and chest discomfort 5 out of a 10. Advised nurse patient needs to go to ED for evaluation.

## 2017-09-16 NOTE — Telephone Encounter (Signed)
LMTCB

## 2017-09-16 NOTE — ED Notes (Addendum)
Pt eating meal tray. CBG 150

## 2017-09-16 NOTE — Consult Note (Addendum)
The patient has been seen in conjunction with Lyda Jester, PA-C. All aspects of care have been considered and discussed. The patient has been personally interviewed, examined, and all clinical data has been reviewed.   Pleasant gentleman who came to the ER after being encouraged to do so by physical therapist / nurse who visited him today and noticed shortness of breath and complaint of chest discomfort.  These complaints have occurred progressively over the past 2-3 months. They were not associated with his episode of syncope that led to permanent pacemaker therapy. Discomfort occurs spontaneously, lasts less than 30 minutes, and is associated with dyspnea. He describes the discomfort as pressure.  ECG, blood work, and chest x-ray did not reveal acute abnormalities (BNP and delta Trop I are normal).  The symptoms likely represent angina pectoris. I discussed this with the patient and have decided to start anti-ischemic therapy, Imdur. He will follow-up involves for further titration. If symptoms are not controlled, ischemic evaluation may be considered if the patient would be agreeable to invasive evaluation. Continue aspirin 81 mg daily.  Cardiology Consult   Patient ID: Aaron Nelson; MRN: [id number]; DOB: Dec 31, 1929   Admission date: 09/16/2017  Primary Care Provider: Tresa Garter, MD Primary Cardiologist: Dr. Caryl Comes  Primary Electrophysiologist:  Dr. Caryl Comes   Chief Complaint:  Chest Pain   Patient Profile:   Aaron Nelson is a 83 y.o. male with a history of recent sinus arrest x 20 sec w/ syncope and collapse (detected by loop recorder), s/p PPM insertion 08/29/17, HTN, T2DM, myelodysplastic syndrome and anemia, presenting back to the ED with complaint of chest pain. Cardiology consulted, at request of Dr. Tyrone Nine ED MD.   History of Present Illness:   As outlined above, patient recently was admitted for symptomatic bradycardia/ sinus pause and underwent implantation of a PPM,  St. Jude's device, implanted by Dr. Caryl Comes on 08/29/17. Post operative CXR on 9/8 showed no active cardiopulmonary disease, left sided pacemaker with leads in grossly good position. No pneumothorax was noted. He was discharged home.   Pt presented to ED today with complaint of SOB, left sided chest pain, and exertional fatigue.  He notes symptoms have been present for the past 2 months but progressively worsening, tickle he has dyspnea which occurs with physical activity but has also occurred at rest. He denies any lower extremity edema. No weight gain. He notes weight loss. He sleeps with one pillow but does note that over the last several weeks he has woken upon several different occasions feeling short of breath and needing to sit on the side of the bed. Chest discomfort has been intermittent. His left sided chest pressure with radiation to the right side of his neck. This occurs at rest and at times worse with movement/positional changes. He denies any reproducible pain with palpation of his chest wall. The pain is not pleuritic. He is currently asymptomatic in the ED.  CXR was obtained in the ED shows lungs are clear but hyperaerated suggesting an element of emphysema. WBC is WNL. CBC notable for anemia. Hgb is 9.5, which is c/w his baseline. Hgb after recent d/c was 9.1. BMP shows mild renal insuffiencey with SCr at 1.31. BP is moderately elevated in the 803O-122Q systolic. EKG shows NSR with nonspecific TW abnormality. POC troponin is negative.     Past Medical History:  Diagnosis Date  . Diabetes mellitus without complication ([place])   . Hypertension   . Myelodysplasia (myelodysplastic syndrome) ([place])   . S/P placement  of cardiac pacemaker 08/30/2017  . Sinus arrest   . Syncope   . Syncope and collapse 08/30/2017    Past Surgical History:  Procedure Laterality Date  . BONE MARROW BIOPSY  2017  . LOOP RECORDER REMOVAL N/A 08/29/2017   Procedure: LOOP RECORDER REMOVAL;  Surgeon: Deboraha Sprang, MD;   Location: [facility];  Service: Cardiovascular;  Laterality: N/A;  . PACEMAKER IMPLANT Left 08/29/2017   St Jude generator  . PACEMAKER IMPLANT N/A 08/29/2017   Procedure: Pacemaker Implant;  Surgeon: Deboraha Sprang, MD;  Location: [facility];  Service: Cardiovascular;  Laterality: N/A;  . PORTACATH PLACEMENT  2017     Medications Prior to Admission: Prior to Admission medications   Medication Sig Start Date End Date Taking? Authorizing Provider  acetaminophen (TYLENOL) 325 MG tablet Take 2 tablets (650 mg total) by mouth every 4 (four) hours as needed for mild pain. 08/30/17   Isaiah Serge, NP  amLODipine (NORVASC) 2.5 MG tablet Take 1 tablet (2.5 mg total) by mouth daily. 07/23/17   Tresa Garter, MD  aspirin EC 81 MG tablet Take 81 mg by mouth daily.    [provider]  Insulin Glargine (LANTUS SOLOSTAR) 100 UNIT/ML Solostar Pen Inject 40 Units into the skin every morning. 08/30/17   Isaiah Serge, NP  lidocaine (XYLOCAINE) 2 % solution Apply small amount to painful gums with a qtip as needed.  Avoid swallowing 08/13/17   Argentina Donovan, PA-C     Allergies:   No Known Allergies  Social History:   Social History   Social History  . Marital status: Divorced    Spouse name: N/A  . Number of children: N/A  . Years of education: N/A   Occupational History  . Not on file.   Social History Main Topics  . Smoking status: Never Smoker  . Smokeless tobacco: Never Used  . Alcohol use No  . Drug use: No  . Sexual activity: Not on file   Other Topics Concern  . Not on file   Social History Narrative  . No narrative on file    Family History:   The patient's family history includes Hypertension in his mother.    ROS:  Please see the history of present illness.  All other ROS reviewed and negative.     Physical Exam/Data:   Vitals:   09/16/17 1245 09/16/17 1315 09/16/17 1345 09/16/17 1400  BP: (!) 152/76 (!) 166/77 (!) 161/75 (!) 158/78    Pulse: 60 60 62 63  Resp: _0 Temp:      TempSrc:      SpO2: 100% 100% 98% 99%  Weight:      Height:        Intake/Output Summary (Last 24 hours) at 09/16/17 1509 Last data filed at 09/16/17 1408  Gross per 24 hour  Intake                0 ml  Output               50 ml  Net              -50 ml   Filed Weights   09/16/17 1021  Weight: 142 lb (64.4 kg)   Body mass index is 23.63 kg/m.  General:  Well nourished, well developed, in no acute distress HEENT: normal Lymph: no adenopathy Neck: no JVD Endocrine:  No thryomegaly Vascular: No carotid bruits;  FA pulses 2+ bilaterally without bruits  Cardiac:  normal S1, S2; RRR; no murmur Lungs:  clear to auscultation bilaterally, no wheezing, rhonchi or rales  Abd: soft, nontender, no hepatomegaly  Ext: no edema Musculoskeletal:  No deformities, BUE and BLE strength normal and equal Skin: warm and dry  Neuro:  CNs 2-12 intact, no focal abnormalities noted Psych:  Normal affect    EKG:  The ECG that was done was personally reviewed and demonstrates EKG shows NSR with nonspecific TW abnormality.  Relevant CV Studies: None   Laboratory Data:  Chemistry  Recent Labs Lab 09/16/17 1030  NA 136  K 4.4  CL 107  CO2 23  GLUCOSE 210*  BUN 20  CREATININE 1.31*  CALCIUM 8.9  GFRNONAA 48*  GFRAA 56*  ANIONGAP 6    No results for input(s): PROT, ALBUMIN, AST, ALT, ALKPHOS, BILITOT in the last 168 hours. Hematology  Recent Labs Lab 09/16/17 1030  WBC 4.1  RBC 4.12*  HGB 9.5*  HCT 30.0*  MCV 72.8*  MCH 23.1*  MCHC 31.7  RDW 25.8*  PLT 222   Cardiac EnzymesNo results for input(s): TROPONINI in the last 168 hours.   Recent Labs Lab 09/16/17 1049  TROPIPOC 0.00    BNPNo results for input(s): BNP, PROBNP in the last 168 hours.  DDimer No results for input(s): DDIMER in the last 168 hours.  Radiology/Studies:  Dg Chest 2 View  Result Date: 09/16/2017 CLINICAL DATA:  Chest tightness, neck pain,  shortness breath and dizziness over the last 4 days EXAM: CHEST  2 VIEW COMPARISON:  Chest x-ray of 08/30/2017 FINDINGS: The lungs are clear but hyperaerated suggesting and element of emphysema. Mediastinal and hilar contours are unremarkable. The heart is within upper limits of normal. Permanent pacemaker remains. No bony abnormality is seen. IMPRESSION: 1. Hyperaeration.  Possible mild emphysematous change. 2. No active lung disease. 3. Permanent pacemaker remains. Electronically Signed   By: Ivar Drape M.D.   On: 09/16/2017 10:47    Assessment and Plan:    1. Chest Pain And dyspnea: patient reports that his symptoms were present before he underwent permanent pacemaker placement but have continued to worsen. Some of this chest pain features are atypical. His EKG shows normal sinus rhythm with nonspecific T-wave abnormalities. Chest x-ray shows pacemaker with proper lead placement and no acute cardiopulmonary abnormalities.No pneumothorax, pulmonary edema, pleural effusions or infiltrates. His device pocket appears stable without signs of infection. His white blood count is within normal limits. He denies any upper extremity pain or swelling that would suggest upper extremity DVT. His point-of-care troponin in the ED is negative. We will check a follow-up troponin I. He does not appear to be grossly volume overloaded on exam. His lung fields are clear and chest x-ray is also negative. He has no lower extremity edema and no abdominal distention on exam however we will check a BNP for assessment. Clinically the patient appears stable without any signs of acute cardiac issues. If BNP is WNL and 2nd troponin negative, he may not require admission. If negative w/u in the ED, we may be able to consider outpatient echo and stress test. I've placed order for BNP, Troponin I and d-dimer. Will f/u on results. MD to follow with additional recs as well. MD to decide if patient will be admitted.   For questions or  updates, please contact [facility] Please consult [email or web address] for contact info under Cardiology/STEMI.    Signed, Lyda Jester, PA-C  09/16/2017 3:09  PM

## 2017-09-16 NOTE — Telephone Encounter (Signed)
Pt c/o of Chest Pain: STAT if CP now or developed within 24 hours  1. Are you having CP right now? Yes, test tightness  2. Are you experiencing any other symptoms (ex. SOB, nausea, vomiting, sweating)? sob 3. How long have you been experiencing CP? Since saterday 4. Is your CP continuous or coming and going? Coming and going   5. Have you taken Nitroglycerin? no?   Home health rn has taken over the call and has answered the questions

## 2017-09-16 NOTE — ED Provider Notes (Signed)
7:18 PM CTA negative. No chest pain. Seen by cards, Dr. Tamala Julian, ok for d/c. He wrote for nitro and imdur. Patient understands outpatient f/u plan and return precautions.    Sherwood Gambler, MD 09/16/17 [phone]

## 2017-09-16 NOTE — ED Notes (Signed)
Pt verbalized understanding of d/c instructions and has no further questions. Pt stable and NAD. VSS. Pt given prescription by Dr Tamala Julian called into his pharmacy. Pt d/c home with son.

## 2017-09-16 NOTE — ED Notes (Signed)
Pt given a urinal.

## 2017-09-16 NOTE — ED Provider Notes (Signed)
[facility] DEPT Provider Note   CSN: [id number] Arrival date & time: 09/16/17  1015     History   Chief Complaint Chief Complaint  Patient presents with  . Chest Pain    HPI Aaron Nelson is a 83 y.o. male.  83 yo M with a cc of chest pain. Off and on for the past week or so. Occurs when he is exerting himself improves with rest. Patient denies diaphoresis nausea or vomiting. Has been feeling weak over the past couple weeks as well. Describes it as a pressure across his chest. Denies radiation. At maximal is 8 out of 10.   The history is provided by the patient.  Chest Pain   This is a new problem. The current episode started more than 2 days ago. The problem occurs daily. The pain is associated with exertion. The pain is present in the substernal region. The pain is at a severity of 8/10. The pain is moderate. The quality of the pain is described as exertional and heavy. The pain does not radiate. Duration of episode(s) is 2 hours. The symptoms are aggravated by exertion. Associated symptoms include shortness of breath and weakness (genearlized). Pertinent negatives include no abdominal pain, no fever, no headaches, no palpitations and no vomiting. He has tried nothing for the symptoms. The treatment provided no relief.  His past medical history is significant for diabetes, hyperlipidemia and hypertension.  Pertinent negatives for past medical history include no DVT, no MI and no PE.    Past Medical History:  Diagnosis Date  . Diabetes mellitus without complication ([place])   . Hypertension   . Myelodysplasia (myelodysplastic syndrome) ([place])   . S/P placement of cardiac pacemaker 08/30/2017  . Sinus arrest   . Syncope   . Syncope and collapse 08/30/2017    Patient Active Problem List   Diagnosis Date Noted  . Syncope and collapse 08/30/2017  . Sinus arrest 20 sec with syncope 08/30/2017  . S/P placement of cardiac pacemaker 08/30/2017  . Sinus node dysfunction ([place]) 08/29/2017   . Myelofibrosis ([place]) 07/24/2017  . JAK2 V617F mutation 07/24/2017  . Essential hypertension 07/15/2017  . Gait disturbance 07/15/2017  . Uncontrolled type 2 diabetes mellitus without complication, with long-term current use of insulin ([place]) 07/15/2017  . Leukopenia 07/03/2017  . Anemia 07/03/2017  . Thrombocytosis ([place]) 07/03/2017    Past Surgical History:  Procedure Laterality Date  . BONE MARROW BIOPSY  2017  . LOOP RECORDER REMOVAL N/A 08/29/2017   Procedure: LOOP RECORDER REMOVAL;  Surgeon: Deboraha Sprang, MD;  Location: [facility];  Service: Cardiovascular;  Laterality: N/A;  . PACEMAKER IMPLANT Left 08/29/2017   St Jude generator  . PACEMAKER IMPLANT N/A 08/29/2017   Procedure: Pacemaker Implant;  Surgeon: Deboraha Sprang, MD;  Location: [facility];  Service: Cardiovascular;  Laterality: N/A;  . PORTACATH PLACEMENT  2017       Home Medications    Prior to Admission medications   Medication Sig Start Date End Date Taking? Authorizing Provider  acetaminophen (TYLENOL) 325 MG tablet Take 2 tablets (650 mg total) by mouth every 4 (four) hours as needed for mild pain. 08/30/17  Yes Isaiah Serge, NP  amLODipine (NORVASC) 2.5 MG tablet Take 1 tablet (2.5 mg total) by mouth daily. 07/23/17  Yes Tresa Garter, MD  aspirin EC 81 MG tablet Take 81 mg by mouth daily.   Yes [provider]  Insulin Glargine (LANTUS SOLOSTAR) 100 UNIT/ML Solostar Pen  Inject 40 Units into the skin every morning. 08/30/17  Yes Isaiah Serge, NP  lidocaine (XYLOCAINE) 2 % solution Apply small amount to painful gums with a qtip as needed.  Avoid swallowing Patient not taking: Reported on 09/16/2017 08/13/17   Argentina Donovan, PA-C    Family History Family History  Problem Relation Age of Onset  . Hypertension Mother     Social History Social History  Substance Use Topics  . Smoking status: Never Smoker  . Smokeless tobacco: Never Used  . Alcohol use No      Allergies   Patient has no known allergies.   Review of Systems Review of Systems  Constitutional: Negative for chills and fever.  HENT: Negative for congestion and facial swelling.   Eyes: Negative for discharge and visual disturbance.  Respiratory: Positive for shortness of breath.   Cardiovascular: Positive for chest pain. Negative for palpitations.  Gastrointestinal: Negative for abdominal pain, diarrhea and vomiting.  Musculoskeletal: Negative for arthralgias and myalgias.  Skin: Negative for color change and rash.  Neurological: Positive for weakness (genearlized). Negative for tremors, syncope and headaches.  Psychiatric/Behavioral: Negative for confusion and dysphoric mood.     Physical Exam Updated Vital Signs BP (!) 150/70   Pulse 64   Temp 97.6 F (36.4 C) (Oral)   Resp 18   Ht '5\' 5"'  (1.651 m)   Wt 64.4 kg (142 lb)   SpO2 100%   BMI 23.63 kg/m   Physical Exam  Constitutional: He is oriented to person, place, and time. He appears well-developed and well-nourished.  HENT:  Head: Normocephalic and atraumatic.  Eyes: Pupils are equal, round, and reactive to light. EOM are normal.  Neck: Normal range of motion. Neck supple. No JVD present.  Cardiovascular: Normal rate and regular rhythm.  Exam reveals no gallop and no friction rub.   No murmur heard. Pulmonary/Chest: No respiratory distress. He has no wheezes.  Abdominal: He exhibits no distension and no mass. There is no tenderness. There is no rebound and no guarding.  Musculoskeletal: Normal range of motion.  Neurological: He is alert and oriented to person, place, and time.  Skin: No rash noted. No pallor.  Psychiatric: He has a normal mood and affect. His behavior is normal.  Nursing note and vitals reviewed.    ED Treatments / Results  Labs (all labs ordered are listed, but only abnormal results are displayed) Labs Reviewed  BASIC METABOLIC PANEL - Abnormal; Notable for the following:        Result Value   Glucose, Bld 210 (*)    Creatinine, Ser 1.31 (*)    GFR calc non Af Amer 48 (*)    GFR calc Af Amer 56 (*)    All other components within normal limits  CBC - Abnormal; Notable for the following:    RBC 4.12 (*)    Hemoglobin 9.5 (*)    HCT 30.0 (*)    MCV 72.8 (*)    MCH 23.1 (*)    RDW 25.8 (*)    All other components within normal limits  D-DIMER, QUANTITATIVE (NOT AT [facility]) - Abnormal; Notable for the following:    D-Dimer, Quant 1.79 (*)    All other components within normal limits  BRAIN NATRIURETIC PEPTIDE  TROPONIN I  I-STAT TROPONIN, ED    EKG  EKG Interpretation  Date/Time:  Tuesday September 16 2017 10:18:08 EDT Ventricular Rate:  66 PR Interval:  162 QRS Duration: 94 QT Interval:  390 QTC  Calculation: 408 R Axis:   77 Text Interpretation:  Normal sinus rhythm Nonspecific T wave abnormality Abnormal ECG biphasic t waves in lateral leads seen on 24 July Otherwise no significant change Confirmed by Deno Etienne [phone]) on 09/16/2017 12:08:42 PM       Radiology Dg Chest 2 View  Result Date: 09/16/2017 CLINICAL DATA:  Chest tightness, neck pain, shortness breath and dizziness over the last 4 days EXAM: CHEST  2 VIEW COMPARISON:  Chest x-ray of 08/30/2017 FINDINGS: The lungs are clear but hyperaerated suggesting and element of emphysema. Mediastinal and hilar contours are unremarkable. The heart is within upper limits of normal. Permanent pacemaker remains. No bony abnormality is seen. IMPRESSION: 1. Hyperaeration.  Possible mild emphysematous change. 2. No active lung disease. 3. Permanent pacemaker remains. Electronically Signed   By: Ivar Drape M.D.   On: 09/16/2017 10:47    Procedures Procedures (including critical care time)  Medications Ordered in ED Medications  aspirin chewable tablet 324 mg (324 mg Oral Not Given 09/16/17 1407)  isosorbide mononitrate (IMDUR) 24 hr tablet 30 mg (not administered)     Initial Impression / Assessment and  Plan / ED Course  I have reviewed the triage vital signs and the nursing notes.  Pertinent labs & imaging results that were available during my care of the patient were reviewed by me and considered in my medical decision making (see chart for details).     83 yo M with a chief complaint of chest pain.  Going on past week or so.  Symptoms done very typical of ACS. Only occur on exertion improved with rest. Initial troponin is completely negative EKG is also unremarkable. HEART score of 7.  I discussed the case with Dr. Percival Spanish, cards, will come and evaluate the patient.  The patients results and plan were reviewed and discussed.   Any x-rays performed were independently reviewed by myself.   Differential diagnosis were considered with the presenting HPI.  Medications  aspirin chewable tablet 324 mg (324 mg Oral Not Given 09/16/17 1407)  isosorbide mononitrate (IMDUR) 24 hr tablet 30 mg (not administered)    Vitals:   09/16/17 1400 09/16/17 1430 09/16/17 1500 09/16/17 1515  BP: (!) 158/78 (!) 160/72 (!) 117/98 (!) 150/70  Pulse: 63 60 65 64  Resp: '11 13 18   ' Temp:      TempSrc:      SpO2: 99% 99% 100% 100%  Weight:      Height:        Final diagnoses:  Chest pain with high risk for cardiac etiology    Admission/ observation were discussed with the admitting physician, patient and/or family and they are comfortable with the plan.    Final Clinical Impressions(s) / ED Diagnoses   Final diagnoses:  Chest pain with high risk for cardiac etiology    New Prescriptions New Prescriptions   No medications on file     Deno Etienne, DO 09/16/17 1657

## 2017-09-16 NOTE — ED Triage Notes (Signed)
Pt reports sob, fatigue with exertion yesterday. This morning developed left sided cp 5/10. Denies n/v. Denies hx of MI. Has pacemaker. Is a x 4. Sometimes feels pain in his right neck.

## 2017-09-16 NOTE — Discharge Instructions (Signed)
Your CT scan showed a possible abnormality of your left kidney. Your primary care doctor needs to order an outpatient ultrasound or CT scan to follow up on this. Contact them for more information.

## 2017-09-17 ENCOUNTER — Telehealth: Payer: Self-pay | Admitting: Internal Medicine

## 2017-09-17 LAB — CBG MONITORING, ED: GLUCOSE-CAPILLARY: 150 mg/dL — AB (ref 65–99)

## 2017-09-17 NOTE — Telephone Encounter (Signed)
Our schedulers placed call to pt earlier today to schedule post hospital follow up.  Will have scheduling team return call to schedule appointment.   I asked scheduler to transfer call to triage if family needed to speak with nurse.

## 2017-09-17 NOTE — Telephone Encounter (Signed)
New message    Pt friend Dangelo Guzzetta verbalized that he is returning call for rn

## 2017-09-17 NOTE — Telephone Encounter (Signed)
New message    Pt c/o medication issue:  1. Name of Medication: nitro  2. How are you currently taking this medication (dosage and times per day)? Pt took this morning  3. Are you having a reaction (difficulty breathing--STAT)? Threw up   4. What is your medication issue? Pt son states that he threw up the pill this morning and wants to know if pt needs it

## 2017-09-17 NOTE — Telephone Encounter (Signed)
Spoke with patient's son and he states that his dad took his medicine this morning. He states that the patient had a fever this morning and threw up a couple of hours after taking his medicine and was wanting to know if he needed to take another imdur today. Advised for the patient to wait and take it tomorrow. Son verbalized understanding. Son states that the patient has not taken any NTG. He states that the patient has not had any chest pain or any other symptoms. Patient's BP and HR today are 121/75 HR 81.

## 2017-09-18 DIAGNOSIS — D696 Thrombocytopenia, unspecified: Secondary | ICD-10-CM | POA: Diagnosis not present

## 2017-09-18 DIAGNOSIS — Z48812 Encounter for surgical aftercare following surgery on the circulatory system: Secondary | ICD-10-CM | POA: Diagnosis not present

## 2017-09-18 DIAGNOSIS — J449 Chronic obstructive pulmonary disease, unspecified: Secondary | ICD-10-CM | POA: Diagnosis not present

## 2017-09-18 DIAGNOSIS — E1165 Type 2 diabetes mellitus with hyperglycemia: Secondary | ICD-10-CM | POA: Diagnosis not present

## 2017-09-18 DIAGNOSIS — C9590 Leukemia, unspecified not having achieved remission: Secondary | ICD-10-CM | POA: Diagnosis not present

## 2017-09-18 DIAGNOSIS — I10 Essential (primary) hypertension: Secondary | ICD-10-CM | POA: Diagnosis not present

## 2017-09-18 LAB — PATHOLOGIST SMEAR REVIEW

## 2017-09-22 DIAGNOSIS — E1165 Type 2 diabetes mellitus with hyperglycemia: Secondary | ICD-10-CM | POA: Diagnosis not present

## 2017-09-22 DIAGNOSIS — Z48812 Encounter for surgical aftercare following surgery on the circulatory system: Secondary | ICD-10-CM | POA: Diagnosis not present

## 2017-09-22 DIAGNOSIS — C9590 Leukemia, unspecified not having achieved remission: Secondary | ICD-10-CM | POA: Diagnosis not present

## 2017-09-22 DIAGNOSIS — J449 Chronic obstructive pulmonary disease, unspecified: Secondary | ICD-10-CM | POA: Diagnosis not present

## 2017-09-22 DIAGNOSIS — D696 Thrombocytopenia, unspecified: Secondary | ICD-10-CM | POA: Diagnosis not present

## 2017-09-22 DIAGNOSIS — I10 Essential (primary) hypertension: Secondary | ICD-10-CM | POA: Diagnosis not present

## 2017-09-22 NOTE — Progress Notes (Signed)
Cardiology Office Note Date:  09/24/2017  Patient ID:  Aaron Nelson 12/15/1930, MRN [id number] PCP:  Tresa Garter, MD  Cardiologist:  Dr. Caryl Comes   Chief Complaint: ER f/u  History of Present Illness: Aaron Nelson is a 83 y.o. male with history of DM, HTN, myelodysplastic syndrome, syncope 2/2 sinus arrest s/p PPM implant 08/29/17 (and removal of loop), hs hx of dizziness with position changes and micturation as well.  He comes today to be seen for Dr. Caryl Comes, last seen by him at the time of his pacer implant.  He had an ER visit with c/o CP/SOB that onset pre-dated pacer implant by notes. His Trop was negative, BNP was 45, CXR was neg for edema, + hyperaeration, and cardiology eval was undertaken in ER, decided to start ant-anginal tx with nitrate and planned for out patient f/u titration and if persistent CP episodes to consider ischemic evaluation if patient was agreeable to an invasive evaluation.  The patient is accompanied y his son today.  The timeline between them is a little hard to follow, though when asked a number of times there is no component of CP.  He initially had discomfort by the pacer site this has resovled.  The patient's son states that after the pacer implant the patient was doing ok, that they initially did not do much at all during he recovery post pacer implant, though a couple weeks afterwards when the [atient did start to get up and move around more he was feeling very winded, and weak.  Not dizzy, no near syncope or syncope.  He describes very generalized fatigue, weakness, and gets winded and tired very easily with activities that he had no difficulty with before.  In review of records it seems the timing of onset was prior to pacing that it is less clear today.   They report that prior to the pacer he had a home health PT coming to help with some light activities and get him moving out side, I can not get exactly why he had home health PT to begin with though  definitely proceeds pacing.  The son reports that his dad used to get out to the yard and do light activities like use the clippers on the hedges and now can not hardly walk from the house to the car and for any longer distances is using a wheelchair that his PMD ordered for him.   There is no element of CP of any kind described or c/o today, no palpitations.  He feels generalized weakness, loss of strength in his legs (equally), and inability to exert himself to any degree without feeling tired and winded.  His son states that he will sometimes even look winded at rest.  There is no mention of symptoms c/w PND or orthopnea.   Device information: SJM dual chamber PPM implanted 08/29/17, sinus arrest, Dr. Caryl Comes   Past Medical History:  Diagnosis Date  . Diabetes mellitus without complication ([place])   . Hypertension   . Myelodysplasia (myelodysplastic syndrome) ([place])   . S/P placement of cardiac pacemaker 08/30/2017  . Sinus arrest   . Syncope   . Syncope and collapse 08/30/2017    Past Surgical History:  Procedure Laterality Date  . BONE MARROW BIOPSY  2017  . LOOP RECORDER REMOVAL N/A 08/29/2017   Procedure: LOOP RECORDER REMOVAL;  Surgeon: Deboraha Sprang, MD;  Location: [facility];  Service: Cardiovascular;  Laterality: N/A;  . PACEMAKER IMPLANT Left 08/29/2017  St Jude generator  . PACEMAKER IMPLANT N/A 08/29/2017   Procedure: Pacemaker Implant;  Surgeon: Deboraha Sprang, MD;  Location: [facility];  Service: Cardiovascular;  Laterality: N/A;  . PORTACATH PLACEMENT  2017    Current Outpatient Prescriptions  Medication Sig Dispense Refill  . acetaminophen (TYLENOL) 325 MG tablet Take 2 tablets (650 mg total) by mouth every 4 (four) hours as needed for mild pain.    Marland Kitchen amLODipine (NORVASC) 2.5 MG tablet Take 1 tablet (2.5 mg total) by mouth daily. 90 tablet 3  . aspirin EC 81 MG tablet Take 81 mg by mouth daily.    . Insulin Glargine (LANTUS SOLOSTAR) 100 UNIT/ML Solostar  Pen Inject 40 Units into the skin every morning.    . isosorbide mononitrate (IMDUR) 30 MG 24 hr tablet Take 1 tablet (30 mg total) by mouth daily. 30 tablet 11  . lidocaine (XYLOCAINE) 2 % solution Apply small amount to painful gums with a qtip as needed.  Avoid swallowing 100 mL 0  . NITROSTAT 0.4 MG SL tablet Place 1 tablet (0.4 mg total) under the tongue every 5 (five) minutes as needed for chest pain. 25 tablet 3   No current facility-administered medications for this visit.     Allergies:   Patient has no known allergies.   Social History:  The patient  reports that he has never smoked. He has never used smokeless tobacco. He reports that he does not drink alcohol or use drugs.   Family History:  The patient's family history includes Hypertension in his mother.  ROS:  Please see the history of present illness.  All other systems are reviewed and otherwise negative.   PHYSICAL EXAM:  VS:  BP 136/64   Pulse 71   Ht '5\' 5"'  (1.651 m)   Wt 150 lb (68 kg)   BMI 24.96 kg/m  BMI: Body mass index is 24.96 kg/m. Well nourished, well developed though thin body habitus, in no acute distress  HEENT: normocephalic, atraumatic  Neck: no JVD, carotid bruits or masses Cardiac:   RRR; no significant murmurs, no rubs, or gallops Lungs: CTA b/l, no wheezing, rhonchi or rales  Abd: soft, nontender MS: no deformity, age appropriate atrophy Ext: *no edema  Skin: warm and dry, no rash Neuro:  No gross deficits appreciated Psych: euthymic mood, full affect  PPM site is stable, no tethering or discomfort   EKG:  Not done today, ER and prior EKGs reviewed PPM interrogation done today and reviewed by myself: battery and lead measurements are good, he only AP 13%, VP <1%, acute implant outputs/programming noted, no AMS, HVR, or other observations noted  Recent Labs: 07/03/2017: ALT 16 09/16/2017: B Natriuretic Peptide 45.3; BUN 20; Creatinine, Ser 1.31; Hemoglobin 9.5; Platelets 222; Potassium  4.4; Sodium 136  06/11/2017: Chol/HDL Ratio 2.7; Cholesterol, Total 157; HDL 59; LDL Calculated 73; Triglycerides 124   Estimated Creatinine Clearance: 36.5 mL/min (A) (by C-G formula based on SCr of 1.31 mg/dL (H)).   Wt Readings from Last 3 Encounters:  09/24/17 150 lb (68 kg)  09/16/17 142 lb (64.4 kg)  08/30/17 142 lb 9.6 oz (64.7 kg)     Other studies reviewed: Additional studies/records reviewed today include: summarized above  ASSESSMENT AND PLAN:  1. PPM     Site is well healed, no erythema, edema, warmth  2. Functional decline, DOE, SOB     H/H at his baseline, no symptoms of illness, fever, WBC was wnl  His exam does not suggest fluid OL     BNP in ER was 45, CXR and CT CT noted, no pneumothorax, pulmonary edema or effusions, no PE     He has VP <1% of the time, only AP 16%     There is no element of CP that is elicited today, no exam findings to suggest pericardial effusion     Addition of nitrate reportedly has made no negative or positive change  3. HTN     No changes  Unclear the etiology of his generalized functional decline and exertional incapacities.  Discussed with Dr. Meda Coffee, DOD, will get an echo, they will see his PMD soon.  ? Rapid functional decline with a period of inactivity post pacer for a couple weeks.  He was also getting home PT apparently prior to pacer.  Disposition: F/u post echo and PMD visit.  2 weeks, sooner if needed.  If there is any escalation of symptoms instructed to seek care back at the hospital.  Current medicines are reviewed at length with the patient today.  The patient did not have any concerns regarding medicines.  Venetia Night, PA-C 09/24/2017 10:08 AM     [place] [address] [address] [phone] (office)  [phone] (fax)

## 2017-09-23 DIAGNOSIS — D696 Thrombocytopenia, unspecified: Secondary | ICD-10-CM | POA: Diagnosis not present

## 2017-09-23 DIAGNOSIS — J449 Chronic obstructive pulmonary disease, unspecified: Secondary | ICD-10-CM | POA: Diagnosis not present

## 2017-09-23 DIAGNOSIS — I10 Essential (primary) hypertension: Secondary | ICD-10-CM | POA: Diagnosis not present

## 2017-09-23 DIAGNOSIS — E1165 Type 2 diabetes mellitus with hyperglycemia: Secondary | ICD-10-CM | POA: Diagnosis not present

## 2017-09-23 DIAGNOSIS — Z48812 Encounter for surgical aftercare following surgery on the circulatory system: Secondary | ICD-10-CM | POA: Diagnosis not present

## 2017-09-23 DIAGNOSIS — C9590 Leukemia, unspecified not having achieved remission: Secondary | ICD-10-CM | POA: Diagnosis not present

## 2017-09-24 ENCOUNTER — Ambulatory Visit (INDEPENDENT_AMBULATORY_CARE_PROVIDER_SITE_OTHER): Payer: Medicare Other | Admitting: Physician Assistant

## 2017-09-24 VITALS — BP 136/64 | HR 71 | Ht 65.0 in | Wt 150.0 lb

## 2017-09-24 DIAGNOSIS — R0602 Shortness of breath: Secondary | ICD-10-CM | POA: Diagnosis not present

## 2017-09-24 DIAGNOSIS — I10 Essential (primary) hypertension: Secondary | ICD-10-CM | POA: Diagnosis not present

## 2017-09-24 DIAGNOSIS — R5383 Other fatigue: Secondary | ICD-10-CM

## 2017-09-24 DIAGNOSIS — Z950 Presence of cardiac pacemaker: Secondary | ICD-10-CM

## 2017-09-24 NOTE — Patient Instructions (Signed)
Medication Instructions:   Your physician recommends that you continue on your current medications as directed. Please refer to the Current Medication list given to you today.   If you need a refill on your cardiac medications before your next appointment, please call your pharmacy.  Labwork: NONE ORDERED  TODAY    Testing/Procedures: Your physician has requested that you have an echocardiogram. Echocardiography is a painless test that uses sound waves to create images of your heart. It provides your doctor with information about the size and shape of your heart and how well your heart's chambers and valves are working. This procedure takes approximately one hour. There are no restrictions for this procedure.    Follow-Up: IN 2 WEEKS  WITH KLEIN OR URSUY     Any Other Special Instructions Will Be Listed Below (If Applicable).

## 2017-09-25 DIAGNOSIS — J449 Chronic obstructive pulmonary disease, unspecified: Secondary | ICD-10-CM | POA: Diagnosis not present

## 2017-09-25 DIAGNOSIS — I10 Essential (primary) hypertension: Secondary | ICD-10-CM | POA: Diagnosis not present

## 2017-09-25 DIAGNOSIS — C9590 Leukemia, unspecified not having achieved remission: Secondary | ICD-10-CM | POA: Diagnosis not present

## 2017-09-25 DIAGNOSIS — E1165 Type 2 diabetes mellitus with hyperglycemia: Secondary | ICD-10-CM | POA: Diagnosis not present

## 2017-09-25 DIAGNOSIS — D696 Thrombocytopenia, unspecified: Secondary | ICD-10-CM | POA: Diagnosis not present

## 2017-09-25 DIAGNOSIS — Z48812 Encounter for surgical aftercare following surgery on the circulatory system: Secondary | ICD-10-CM | POA: Diagnosis not present

## 2017-09-30 DIAGNOSIS — E1165 Type 2 diabetes mellitus with hyperglycemia: Secondary | ICD-10-CM | POA: Diagnosis not present

## 2017-09-30 DIAGNOSIS — C9590 Leukemia, unspecified not having achieved remission: Secondary | ICD-10-CM | POA: Diagnosis not present

## 2017-09-30 DIAGNOSIS — Z48812 Encounter for surgical aftercare following surgery on the circulatory system: Secondary | ICD-10-CM | POA: Diagnosis not present

## 2017-09-30 DIAGNOSIS — J449 Chronic obstructive pulmonary disease, unspecified: Secondary | ICD-10-CM | POA: Diagnosis not present

## 2017-09-30 DIAGNOSIS — D696 Thrombocytopenia, unspecified: Secondary | ICD-10-CM | POA: Diagnosis not present

## 2017-09-30 DIAGNOSIS — I10 Essential (primary) hypertension: Secondary | ICD-10-CM | POA: Diagnosis not present

## 2017-10-01 DIAGNOSIS — D696 Thrombocytopenia, unspecified: Secondary | ICD-10-CM | POA: Diagnosis not present

## 2017-10-01 DIAGNOSIS — Z48812 Encounter for surgical aftercare following surgery on the circulatory system: Secondary | ICD-10-CM | POA: Diagnosis not present

## 2017-10-01 DIAGNOSIS — I10 Essential (primary) hypertension: Secondary | ICD-10-CM | POA: Diagnosis not present

## 2017-10-01 DIAGNOSIS — E1165 Type 2 diabetes mellitus with hyperglycemia: Secondary | ICD-10-CM | POA: Diagnosis not present

## 2017-10-01 DIAGNOSIS — J449 Chronic obstructive pulmonary disease, unspecified: Secondary | ICD-10-CM | POA: Diagnosis not present

## 2017-10-01 DIAGNOSIS — C9590 Leukemia, unspecified not having achieved remission: Secondary | ICD-10-CM | POA: Diagnosis not present

## 2017-10-02 DIAGNOSIS — J449 Chronic obstructive pulmonary disease, unspecified: Secondary | ICD-10-CM | POA: Diagnosis not present

## 2017-10-02 DIAGNOSIS — I10 Essential (primary) hypertension: Secondary | ICD-10-CM | POA: Diagnosis not present

## 2017-10-02 DIAGNOSIS — E1165 Type 2 diabetes mellitus with hyperglycemia: Secondary | ICD-10-CM | POA: Diagnosis not present

## 2017-10-02 DIAGNOSIS — Z48812 Encounter for surgical aftercare following surgery on the circulatory system: Secondary | ICD-10-CM | POA: Diagnosis not present

## 2017-10-02 DIAGNOSIS — C9590 Leukemia, unspecified not having achieved remission: Secondary | ICD-10-CM | POA: Diagnosis not present

## 2017-10-02 DIAGNOSIS — D696 Thrombocytopenia, unspecified: Secondary | ICD-10-CM | POA: Diagnosis not present

## 2017-10-03 ENCOUNTER — Other Ambulatory Visit (HOSPITAL_COMMUNITY): Payer: Medicare Other

## 2017-10-07 DIAGNOSIS — I10 Essential (primary) hypertension: Secondary | ICD-10-CM | POA: Diagnosis not present

## 2017-10-07 DIAGNOSIS — Z48812 Encounter for surgical aftercare following surgery on the circulatory system: Secondary | ICD-10-CM | POA: Diagnosis not present

## 2017-10-07 DIAGNOSIS — D696 Thrombocytopenia, unspecified: Secondary | ICD-10-CM | POA: Diagnosis not present

## 2017-10-07 DIAGNOSIS — J449 Chronic obstructive pulmonary disease, unspecified: Secondary | ICD-10-CM | POA: Diagnosis not present

## 2017-10-07 DIAGNOSIS — C9590 Leukemia, unspecified not having achieved remission: Secondary | ICD-10-CM | POA: Diagnosis not present

## 2017-10-07 DIAGNOSIS — E1165 Type 2 diabetes mellitus with hyperglycemia: Secondary | ICD-10-CM | POA: Diagnosis not present

## 2017-10-08 NOTE — Progress Notes (Signed)
Cardiology Office Note Date:  10/09/2017  Patient ID:  Aaron, Nelson Apr 26, 1930, MRN [id number] PCP:  Tresa Garter, MD  Cardiologist:  Dr. Caryl Comes   Chief Complaint: ER f/u  History of Present Illness: Aaron Nelson is a 83 y.o. male with history of DM, HTN, myelodysplastic syndrome, syncope 2/2 sinus arrest s/p PPM implant 08/29/17 (and removal of loop), hs hx of dizziness with position changes and micturation as well.  He comes today to be seen for Dr. Caryl Comes, last seen by him at the time of his pacer implant.  He had an ER visit with c/o CP/SOB that onset pre-dated pacer implant by notes. His Trop was negative, BNP was 45, CXR was neg for edema, + hyperaeration, (no mention of pneumothorax) and cardiology eval was undertaken in ER, decided to start ant-anginal tx with nitrate and planned for out patient f/u titration and if persistent CP episodes to consider ischemic evaluation if patient was agreeable to an invasive evaluation.  The patient was seen last by myself 09/24/17, he was accompanied by his son.  At that visit in noted: "The timeline between them is a little hard to follow, though when asked a number of times there is no component of CP.  He initially had discomfort by the pacer site this has resovled.  The patient's son states that after the pacer implant the patient was doing ok, that they initially did not do much at all during he recovery post pacer implant, though a couple weeks afterwards when the patient did start to get up and move around more he was feeling very winded, and weak.  Not dizzy, no near syncope or syncope.  He describes very generalized fatigue, weakness, and gets winded and tired very easily with activities that he had no difficulty with before.  In review of records it seems the timing of onset was prior to pacing that it is less clear today.   They reported that prior to the pacer he had a home health PT coming to help with some light activities and get  him moving out side, I can not get exactly why he had home health PT to begin with though definitely proceeds pacing.  The son reports that his dad used to get out to the yard and do light activities like use the clippers on the hedges and now can not hardly walk from the house to the car and for any longer distances is using a wheelchair that his PMD ordered for him.   There was no element of CP of any kind described or c/o at his visit, no palpitations.  He felt generalized weakness, loss of strength in his legs (equally), and inability to exert himself to any degree without feeling tired and winded.  His son stated that he will sometimes even look winded at rest.  There was no mention of symptoms c/w PND or orthopnea.  In discussion with DOD, very unclear what the etiology of his functional decline was, it was decided to get an echo and f/u wth PMD as well for potential non-cardiac etiologies.   TODAY He is again accompanied by his son.  The patient again denies any kind of CP, no palpitations, no dizziness, near syncope or syncope.  No rest SOB, but continues with DOE on minimal exertion.  Generally feels tired all of the time.  The patient and son again feel like his functional decline is new and a clear change in the last 2 months.  His  son requests a stress test to further evaluate this.  He is observed to ambulate in today with the aid of a cane, though his son states any further and still requiring wheelchair.  No falls reported.  The patient report chronic trouble with initiating urine but no pain, burning or any other urinary symptoms, no fever or signs/symptoms of illness.  He has been unable to see PMD, though has an appointment next week, sees oncology next month.   Device information: SJM dual chamber PPM implanted 08/29/17, sinus arrest, Dr. Caryl Comes   Past Medical History:  Diagnosis Date  . Diabetes mellitus without complication ([place])   . Hypertension   . Myelodysplasia  (myelodysplastic syndrome) ([place])   . S/P placement of cardiac pacemaker 08/30/2017  . Sinus arrest   . Syncope   . Syncope and collapse 08/30/2017    Past Surgical History:  Procedure Laterality Date  . BONE MARROW BIOPSY  2017  . LOOP RECORDER REMOVAL N/A 08/29/2017   Procedure: LOOP RECORDER REMOVAL;  Surgeon: Deboraha Sprang, MD;  Location: [facility];  Service: Cardiovascular;  Laterality: N/A;  . PACEMAKER IMPLANT Left 08/29/2017   St Jude generator  . PACEMAKER IMPLANT N/A 08/29/2017   Procedure: Pacemaker Implant;  Surgeon: Deboraha Sprang, MD;  Location: [facility];  Service: Cardiovascular;  Laterality: N/A;  . PORTACATH PLACEMENT  2017    Current Outpatient Prescriptions  Medication Sig Dispense Refill  . acetaminophen (TYLENOL) 325 MG tablet Take 2 tablets (650 mg total) by mouth every 4 (four) hours as needed for mild pain.    Marland Kitchen amLODipine (NORVASC) 2.5 MG tablet Take 1 tablet (2.5 mg total) by mouth daily. 90 tablet 3  . aspirin EC 81 MG tablet Take 81 mg by mouth daily.    . Insulin Glargine (LANTUS SOLOSTAR) 100 UNIT/ML Solostar Pen Inject 40 Units into the skin every morning.    . isosorbide mononitrate (IMDUR) 30 MG 24 hr tablet Take 1 tablet (30 mg total) by mouth daily. 30 tablet 11  . lidocaine (XYLOCAINE) 2 % solution Apply small amount to painful gums with a qtip as needed.  Avoid swallowing 100 mL 0  . NITROSTAT 0.4 MG SL tablet Place 1 tablet (0.4 mg total) under the tongue every 5 (five) minutes as needed for chest pain. 25 tablet 3   No current facility-administered medications for this visit.     Allergies:   Patient has no known allergies.   Social History:  The patient  reports that he has never smoked. He has never used smokeless tobacco. He reports that he does not drink alcohol or use drugs.   Family History:  The patient's family history includes Hypertension in his mother.  ROS:  Please see the history of present illness.  All other  systems are reviewed and otherwise negative.   PHYSICAL EXAM:  VS:  BP 136/68   Pulse 70   Ht '5\' 5"'  (1.651 m)   Wt 150 lb (68 kg)   BMI 24.96 kg/m  BMI: Body mass index is 24.96 kg/m. Well nourished, well developed though thin body habitus, in no acute distress  HEENT: normocephalic, atraumatic  Neck: no JVD, carotid bruits or masses Cardiac:  RRR; no significant murmurs, no rubs, or gallops Lungs: CTA b/l, no wheezing, rhonchi or rales  Abd: soft, nontender MS: no deformity, age appropriate atrophy Ext: no edema  Skin: warm and dry, no rash Neuro:  No gross deficits appreciated Psych: euthymic mood,  full affect  PPM site is stable, no tethering or discomfort, no signs of infection   EKG:  Not done today, ER and prior EKGs were previously reviewed PPM interrogation done today and reviewed by myself:  battery and lead measurements are good, he only AP 4.8%, VP <1%, acute implant outputs/programming remain, no AMS, HVR, or other observations noted.  His HR histogram is good, avg HR 60's-90's  Recent Labs: 07/03/2017: ALT 16 09/16/2017: B Natriuretic Peptide 45.3; BUN 20; Creatinine, Ser 1.31; Hemoglobin 9.5; Platelets 222; Potassium 4.4; Sodium 136  06/11/2017: Chol/HDL Ratio 2.7; Cholesterol, Total 157; HDL 59; LDL Calculated 73; Triglycerides 124   CrCl cannot be calculated (Patient's most recent lab result is older than the maximum 21 days allowed.).   Wt Readings from Last 3 Encounters:  10/09/17 150 lb (68 kg)  09/24/17 150 lb (68 kg)  09/16/17 142 lb (64.4 kg)     Other studies reviewed: Additional studies/records reviewed today include: summarized above  ASSESSMENT AND PLAN:  1. PPM     Site is well healed, no erythema, edema, warmth or exam findings to suggest infection  2. Functional decline, generalized weakness, DOE     Labs from his ER visit noted H/H at his baseline, no symptoms of illness, was afebrile, WBC was wnl     His exam does not suggest fluid OL      BNP in ER was 45, CXR and CT CT noted, no pneumothorax was noted, no pulmonary edema or effusions, no PE     He has VP <1% of the time, only AP 4.8% since last visit with good HRH     Prelim echo findings from tech with normal LVEF and no pericardial effusion     His BP is good, not tachycardic, presenting rhythm today is SR 70s, no symptoms of illness or fever     He does not appear ill  3. HTN     Looks good, no changes  Unclear the etiology of his generalized functional decline and DOE remains unclear, no obvious cardiac etiology.  ? DOE as an anginal equivalent, initially his ER visit reported CP, the patient and son would like a stress test that was discussed at that time.  He has not had further CP (on nitrate), will oder lexiscan stress test.    Discussed again evaluation into potential non-cardiac etiologies as well, sees PMD next week, oncology next month, suggested perhaps to see if this could be moved up.  Disposition: Will await his stress results, for now keep scheduled appointment in Dec.  Will move up if needed.  NOTED: official read of his echo from this AM, LVEF 55-60%, no WMA, grade I DD, no significant VHD, no description of pericardial effusion  I have staff messaged Dr. Caryl Comes to review case for any input, and additional investigation he recommends, I have also staff messaged CMA to have the patient get UA since he wont be seeing PMD until next week.   Current medicines are reviewed at length with the patient today.  The patient did not have any concerns regarding medicines.  Venetia Night, PA-C 10/09/2017 10:07 AM     [place] [address] [place] [address] [phone] (office)  [phone] (fax)

## 2017-10-09 ENCOUNTER — Other Ambulatory Visit: Payer: Self-pay

## 2017-10-09 ENCOUNTER — Ambulatory Visit (INDEPENDENT_AMBULATORY_CARE_PROVIDER_SITE_OTHER): Payer: Medicare Other | Admitting: Physician Assistant

## 2017-10-09 ENCOUNTER — Ambulatory Visit (HOSPITAL_COMMUNITY): Payer: Medicare Other | Attending: Cardiovascular Disease

## 2017-10-09 VITALS — BP 136/68 | HR 70 | Ht 65.0 in | Wt 150.0 lb

## 2017-10-09 DIAGNOSIS — R55 Syncope and collapse: Secondary | ICD-10-CM | POA: Insufficient documentation

## 2017-10-09 DIAGNOSIS — R0602 Shortness of breath: Secondary | ICD-10-CM

## 2017-10-09 DIAGNOSIS — R5383 Other fatigue: Secondary | ICD-10-CM

## 2017-10-09 DIAGNOSIS — E119 Type 2 diabetes mellitus without complications: Secondary | ICD-10-CM | POA: Insufficient documentation

## 2017-10-09 DIAGNOSIS — D649 Anemia, unspecified: Secondary | ICD-10-CM | POA: Insufficient documentation

## 2017-10-09 DIAGNOSIS — I351 Nonrheumatic aortic (valve) insufficiency: Secondary | ICD-10-CM | POA: Insufficient documentation

## 2017-10-09 DIAGNOSIS — I10 Essential (primary) hypertension: Secondary | ICD-10-CM

## 2017-10-09 DIAGNOSIS — I495 Sick sinus syndrome: Secondary | ICD-10-CM

## 2017-10-09 DIAGNOSIS — Z950 Presence of cardiac pacemaker: Secondary | ICD-10-CM

## 2017-10-09 NOTE — Patient Instructions (Addendum)
  Medication Instructions:   Your physician recommends that you continue on your current medications as directed. Please refer to the Current Medication list given to you today.   If you need a refill on your cardiac medications before your next appointment, please call your pharmacy.  Labwork: NONE ORDERED  TODAY    Testing/Procedures: Your physician has requested that you have a lexiscan myoview. For further information please visit [email or web address]. Please follow instruction sheet, as given.     Follow-Up: AS SCHEDULED WITH KLEIN    Any Other Special Instructions Will Be Listed Below (If Applicable).

## 2017-10-13 ENCOUNTER — Telehealth (HOSPITAL_COMMUNITY): Payer: Self-pay | Admitting: *Deleted

## 2017-10-13 DIAGNOSIS — Z48812 Encounter for surgical aftercare following surgery on the circulatory system: Secondary | ICD-10-CM | POA: Diagnosis not present

## 2017-10-13 DIAGNOSIS — E1165 Type 2 diabetes mellitus with hyperglycemia: Secondary | ICD-10-CM | POA: Diagnosis not present

## 2017-10-13 DIAGNOSIS — I10 Essential (primary) hypertension: Secondary | ICD-10-CM | POA: Diagnosis not present

## 2017-10-13 DIAGNOSIS — J449 Chronic obstructive pulmonary disease, unspecified: Secondary | ICD-10-CM | POA: Diagnosis not present

## 2017-10-13 DIAGNOSIS — C9590 Leukemia, unspecified not having achieved remission: Secondary | ICD-10-CM | POA: Diagnosis not present

## 2017-10-13 DIAGNOSIS — D696 Thrombocytopenia, unspecified: Secondary | ICD-10-CM | POA: Diagnosis not present

## 2017-10-13 NOTE — Telephone Encounter (Signed)
[name] [name] given detailed instructions per Myocardial Perfusion Study Information Sheet for the test on 10/16/17 at 0730. Patient notified to arrive 15 minutes early and that it is imperative to arrive on time for appointment to keep from having the test rescheduled.  If you need to cancel or reschedule your appointment, please call the office within 24 hours of your appointment. . Patient verbalized understanding.Aaron Nelson, Ranae Palms

## 2017-10-14 DIAGNOSIS — Z48812 Encounter for surgical aftercare following surgery on the circulatory system: Secondary | ICD-10-CM | POA: Diagnosis not present

## 2017-10-14 DIAGNOSIS — I10 Essential (primary) hypertension: Secondary | ICD-10-CM | POA: Diagnosis not present

## 2017-10-14 DIAGNOSIS — D696 Thrombocytopenia, unspecified: Secondary | ICD-10-CM | POA: Diagnosis not present

## 2017-10-14 DIAGNOSIS — E1165 Type 2 diabetes mellitus with hyperglycemia: Secondary | ICD-10-CM | POA: Diagnosis not present

## 2017-10-14 DIAGNOSIS — C9590 Leukemia, unspecified not having achieved remission: Secondary | ICD-10-CM | POA: Diagnosis not present

## 2017-10-14 DIAGNOSIS — J449 Chronic obstructive pulmonary disease, unspecified: Secondary | ICD-10-CM | POA: Diagnosis not present

## 2017-10-15 ENCOUNTER — Ambulatory Visit: Payer: Medicare Other | Attending: Internal Medicine | Admitting: Internal Medicine

## 2017-10-15 ENCOUNTER — Encounter: Payer: Self-pay | Admitting: Internal Medicine

## 2017-10-15 VITALS — BP 98/56 | HR 97 | Temp 97.2°F | Resp 18 | Ht 65.0 in | Wt 140.0 lb

## 2017-10-15 DIAGNOSIS — Z79899 Other long term (current) drug therapy: Secondary | ICD-10-CM | POA: Insufficient documentation

## 2017-10-15 DIAGNOSIS — Z7982 Long term (current) use of aspirin: Secondary | ICD-10-CM | POA: Insufficient documentation

## 2017-10-15 DIAGNOSIS — Z794 Long term (current) use of insulin: Secondary | ICD-10-CM | POA: Diagnosis not present

## 2017-10-15 DIAGNOSIS — R93429 Abnormal radiologic findings on diagnostic imaging of unspecified kidney: Secondary | ICD-10-CM

## 2017-10-15 DIAGNOSIS — E1165 Type 2 diabetes mellitus with hyperglycemia: Secondary | ICD-10-CM | POA: Diagnosis not present

## 2017-10-15 DIAGNOSIS — IMO0001 Reserved for inherently not codable concepts without codable children: Secondary | ICD-10-CM

## 2017-10-15 DIAGNOSIS — I10 Essential (primary) hypertension: Secondary | ICD-10-CM

## 2017-10-15 DIAGNOSIS — Z950 Presence of cardiac pacemaker: Secondary | ICD-10-CM | POA: Diagnosis not present

## 2017-10-15 LAB — POCT GLYCOSYLATED HEMOGLOBIN (HGB A1C): Hemoglobin A1C: 13.5

## 2017-10-15 LAB — GLUCOSE, POCT (MANUAL RESULT ENTRY): POC Glucose: 366 mg/dl — AB (ref 70–99)

## 2017-10-15 MED ORDER — INSULIN GLARGINE 100 UNIT/ML SOLOSTAR PEN: 50.0000 [IU] | PEN_INJECTOR | SUBCUTANEOUS | 3 refills | Status: DC

## 2017-10-15 MED ORDER — AMLODIPINE BESYLATE 2.5 MG PO TABS: 2.5000 mg | ORAL_TABLET | Freq: Every day | ORAL | 3 refills | Status: DC

## 2017-10-15 MED ORDER — ISOSORBIDE MONONITRATE ER 30 MG PO TB24: 30.0000 mg | ORAL_TABLET | Freq: Every day | ORAL | 11 refills | Status: DC

## 2017-10-15 NOTE — Patient Instructions (Signed)
Diabetes Mellitus and Food It is important for you to manage your blood sugar (glucose) level. Your blood glucose level can be greatly affected by what you eat. Eating healthier foods in the appropriate amounts throughout the day at about the same time each day will help you control your blood glucose level. It can also help slow or prevent worsening of your diabetes mellitus. Healthy eating may even help you improve the level of your blood pressure and reach or maintain a healthy weight. General recommendations for healthful eating and cooking habits include:  Eating meals and snacks regularly. Avoid going long periods of time without eating to lose weight.  Eating a diet that consists mainly of plant-based foods, such as fruits, vegetables, nuts, legumes, and whole grains.  Using low-heat cooking methods, such as baking, instead of high-heat cooking methods, such as deep frying.  Work with your dietitian to make sure you understand how to use the Nutrition Facts information on food labels. How can food affect me? Carbohydrates Carbohydrates affect your blood glucose level more than any other type of food. Your dietitian will help you determine how many carbohydrates to eat at each meal and teach you how to count carbohydrates. Counting carbohydrates is important to keep your blood glucose at a healthy level, especially if you are using insulin or taking certain medicines for diabetes mellitus. Alcohol Alcohol can cause sudden decreases in blood glucose (hypoglycemia), especially if you use insulin or take certain medicines for diabetes mellitus. Hypoglycemia can be a life-threatening condition. Symptoms of hypoglycemia (sleepiness, dizziness, and disorientation) are similar to symptoms of having too much alcohol. If your health care provider has given you approval to drink alcohol, do so in moderation and use the following guidelines:  Women should not have more than one drink per day, and men  should not have more than two drinks per day. One drink is equal to: ? 12 oz of beer. ? 5 oz of wine. ? 1 oz of hard liquor.  Do not drink on an empty stomach.  Keep yourself hydrated. Have water, diet soda, or unsweetened iced tea.  Regular soda, juice, and other mixers might contain a lot of carbohydrates and should be counted.  What foods are not recommended? As you make food choices, it is important to remember that all foods are not the same. Some foods have fewer nutrients per serving than other foods, even though they might have the same number of calories or carbohydrates. It is difficult to get your body what it needs when you eat foods with fewer nutrients. Examples of foods that you should avoid that are high in calories and carbohydrates but low in nutrients include:  Trans fats (most processed foods list trans fats on the Nutrition Facts label).  Regular soda.  Juice.  Candy.  Sweets, such as cake, pie, doughnuts, and cookies.  Fried foods.  What foods can I eat? Eat nutrient-rich foods, which will nourish your body and keep you healthy. The food you should eat also will depend on several factors, including:  The calories you need.  The medicines you take.  Your weight.  Your blood glucose level.  Your blood pressure level.  Your cholesterol level.  You should eat a variety of foods, including:  Protein. ? Lean cuts of meat. ? Proteins low in saturated fats, such as fish, egg whites, and beans. Avoid processed meats.  Fruits and vegetables. ? Fruits and vegetables that may help control blood glucose levels, such as apples,   mangoes, and yams.  Dairy products. ? Choose fat-free or low-fat dairy products, such as milk, yogurt, and cheese.  Grains, bread, pasta, and rice. ? Choose whole grain products, such as multigrain bread, whole oats, and brown rice. These foods may help control blood pressure.  Fats. ? Foods containing healthful fats, such as  nuts, avocado, olive oil, canola oil, and fish.  Does everyone with diabetes mellitus have the same meal plan? Because every person with diabetes mellitus is different, there is not one meal plan that works for everyone. It is very important that you meet with a dietitian who will help you create a meal plan that is just right for you. This information is not intended to replace advice given to you by your health care provider. Make sure you discuss any questions you have with your health care provider. Document Released: 09/05/2005 Document Revised: 05/16/2016 Document Reviewed: 11/05/2013 Elsevier Interactive Patient Education  2017 [employer] Diabetes Mellitus and Exercise Exercising regularly is important for your overall health, especially when you have diabetes (diabetes mellitus). Exercising is not only about losing weight. It has many health benefits, such as increasing muscle strength and bone density and reducing body fat and stress. This leads to improved fitness, flexibility, and endurance, all of which result in better overall health. Exercise has additional benefits for people with diabetes, including:  Reducing appetite.  Helping to lower and control blood glucose.  Lowering blood pressure.  Helping to control amounts of fatty substances (lipids) in the blood, such as cholesterol and triglycerides.  Helping the body to respond better to insulin (improving insulin sensitivity).  Reducing how much insulin the body needs.  Decreasing the risk for heart disease by: ? Lowering cholesterol and triglyceride levels. ? Increasing the levels of good cholesterol. ? Lowering blood glucose levels.  What is my activity plan? Your health care provider or certified diabetes educator can help you make a plan for the type and frequency of exercise (activity plan) that works for you. Make sure that you:  Do at least 150 minutes of moderate-intensity or vigorous-intensity exercise each  week. This could be brisk walking, biking, or water aerobics. ? Do stretching and strength exercises, such as yoga or weightlifting, at least 2 times a week. ? Spread out your activity over at least 3 days of the week.  Get some form of physical activity every day. ? Do not go more than 2 days in a row without some kind of physical activity. ? Avoid being inactive for more than 90 minutes at a time. Take frequent breaks to walk or stretch.  Choose a type of exercise or activity that you enjoy, and set realistic goals.  Start slowly, and gradually increase the intensity of your exercise over time.  What do I need to know about managing my diabetes?  Check your blood glucose before and after exercising. ? If your blood glucose is higher than 240 mg/dL (13.3 mmol/L) before you exercise, check your urine for ketones. If you have ketones in your urine, do not exercise until your blood glucose returns to normal.  Know the symptoms of low blood glucose (hypoglycemia) and how to treat it. Your risk for hypoglycemia increases during and after exercise. Common symptoms of hypoglycemia can include: ? Hunger. ? Anxiety. ? Sweating and feeling clammy. ? Confusion. ? Dizziness or feeling light-headed. ? Increased heart rate or palpitations. ? Blurry vision. ? Tingling or numbness around the mouth, lips, or tongue. ? Tremors or shakes. ?   Irritability.  Keep a rapid-acting carbohydrate snack available before, during, and after exercise to help prevent or treat hypoglycemia.  Avoid injecting insulin into areas of the body that are going to be exercised. For example, avoid injecting insulin into: ? The arms, when playing tennis. ? The legs, when jogging.  Keep records of your exercise habits. Doing this can help you and your health care provider adjust your diabetes management plan as needed. Write down: ? Food that you eat before and after you exercise. ? Blood glucose levels before and after you  exercise. ? The type and amount of exercise you have done. ? When your insulin is expected to peak, if you use insulin. Avoid exercising at times when your insulin is peaking.  When you start a new exercise or activity, work with your health care provider to make sure the activity is safe for you, and to adjust your insulin, medicines, or food intake as needed.  Drink plenty of water while you exercise to prevent dehydration or heat stroke. Drink enough fluid to keep your urine clear or pale yellow. This information is not intended to replace advice given to you by your health care provider. Make sure you discuss any questions you have with your health care provider. Document Released: 02/29/2004 Document Revised: 06/28/2016 Document Reviewed: 05/20/2016 Elsevier Interactive Patient Education  2018 Elsevier Inc.  

## 2017-10-15 NOTE — Progress Notes (Signed)
Aaron Nelson, is a 83 y.o. male  SAY:[id number]  ATF:[id number]  DOB - 1930-05-26  Chief Complaint  Patient presents with  . Hypertension      Subjective:   Aaron Nelson is a 83 y.o. male with history of DM, HTN, myelodysplastic syndrome, syncope 2/2 sinus arrest s/p PPM implant 08/29/17 (and removal of loop), hs hx of dizziness with position changes and micturation as well who presents here today for a follow up visit and medication management. Blood sugar has been uncontrolled lately with Lowest BS level being 250, he had levels well above 400 last week. Most recent HbA1C was ~14%. He is SOB most of the time, will be going for stress test tomorrow. Recent ECHO showed normal LVEF.  His son, who is here with him for this encounter is worried that he has so many things going on (comorbidities). Patient follows up with oncologist for myelofibrosis. Patient has No headache, No chest pain, No abdominal pain - No Nausea, No new weakness tingling or numbness.  Patient had a recent CT abd that showed:  IMPRESSION: 1. Technically adequate exam showing no acute pulmonary embolus. 2. 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 Recommend annual imaging followup by CTA or MRA. This recommendation follows 2010 ACCF/AHA/AATS/ACR/ASA/SCA/SCAI/SIR/STS/SVM Guidelines for the Diagnosis and Management of Patients with Thoracic Aortic Disease. Circulation.2010; 121: Y706-C376 3.  Aortic atherosclerosis.  (ICD10-I70.0) 4.  Aortic aneurysm NOS (ICD10-I71.9) 5. No normal left kidney identified in the images of the upper abdomen. Has the patient had left nephrectomy? . Suspect normal spleen within the left upper quadrant. However, the left renal mass is not entirely excluded given the limited images performed of the upper abdomen. Would recommend correlation with patient's history. The patient has not had nephrectomy, further imaging with abdominal ultrasound is recommended to confirm normal appearance of the  left kidney and spleen. Alternatively, CT of the abdomen and pelvis could be performed following the administration intravenous contrast.   Problem  Abnormal CT Scan, Kidney    ALLERGIES: No Known Allergies  PAST MEDICAL HISTORY: Past Medical History:  Diagnosis Date  . Diabetes mellitus without complication ([place])   . Hypertension   . Myelodysplasia (myelodysplastic syndrome) ([place])   . S/P placement of cardiac pacemaker 08/30/2017  . Sinus arrest   . Syncope   . Syncope and collapse 08/30/2017    MEDICATIONS AT HOME: Prior to Admission medications   Medication Sig Start Date End Date Taking? Authorizing Provider  acetaminophen (TYLENOL) 325 MG tablet Take 2 tablets (650 mg total) by mouth every 4 (four) hours as needed for mild pain. 08/30/17   Aaron Serge, NP  amLODipine (NORVASC) 2.5 MG tablet Take 1 tablet (2.5 mg total) by mouth daily. 10/15/17   Tresa Garter, MD  aspirin EC 81 MG tablet Take 81 mg by mouth daily.    [provider]  Insulin Glargine (LANTUS SOLOSTAR) 100 UNIT/ML Solostar Pen Inject 50 Units into the skin every morning. 10/15/17   Tresa Garter, MD  isosorbide mononitrate (IMDUR) 30 MG 24 hr tablet Take 1 tablet (30 mg total) by mouth daily. 10/15/17   Tresa Garter, MD  lidocaine (XYLOCAINE) 2 % solution Apply small amount to painful gums with a qtip as needed.  Avoid swallowing 08/13/17   Freeman Caldron M, PA-C  NITROSTAT 0.4 MG SL tablet Place 1 tablet (0.4 mg total) under the tongue every 5 (five) minutes as needed for chest pain. 09/16/17 09/16/18  Belva Crome, MD  Objective:   Vitals:   10/15/17 1046  BP: (!) 98/56  Pulse: 97  Resp: 18  Temp: (!) 97.2 F (36.2 C)  TempSrc: Axillary  SpO2: 100%  Weight: 140 lb (63.5 kg)  Height: 5\' 5"  (1.651 m)   Exam General appearance : Awake, alert, not in any distress. Speech Clear. Not toxic looking HEENT: Atraumatic and Normocephalic, pupils equally reactive to light  and accomodation Neck: Supple, no JVD. No cervical lymphadenopathy.  Chest: Good air entry bilaterally, no added sounds  CVS: S1 S2 regular, no murmurs.  Abdomen: Bowel sounds present, Non tender and not distended with no gaurding, rigidity or rebound. Extremities: B/L Lower Ext shows no edema, both legs are warm to touch Neurology: Awake alert, and oriented X 3, CN II-XII intact, Non focal Skin: No Rash  Data Review Lab Results  Component Value Date   HGBA1C 14.6 07/15/2017    Assessment & Plan   1. Uncontrolled type 2 diabetes mellitus without complication, with long-term current use of insulin (HCC)  - POCT A1C - Glucose (CBG) Increase insulin to 50 units - Insulin Glargine (LANTUS SOLOSTAR) 100 UNIT/ML Solostar Pen; Inject 50 Units into the skin every morning.  Dispense: 15 mL; Refill: 3  2. Essential hypertension  - amLODipine (NORVASC) 2.5 MG tablet; Take 1 tablet (2.5 mg total) by mouth daily.  Dispense: 90 tablet; Refill: 3 - isosorbide mononitrate (IMDUR) 30 MG 24 hr tablet; Take 1 tablet (30 mg total) by mouth daily.  Dispense: 30 tablet; Refill: 11  3. Abnormal CT scan, kidney  - US Renal; Future  Patient have been counseled extensively about nutrition and exercise. Other issues discussed during this visit include: low cholesterol diet, weight control and daily exercise, foot care, annual eye examinations at Ophthalmology, importance of adherence with medications and regular follow-up. We also discussed long term complications of uncontrolled diabetes and hypertension.   Return in about 4 weeks (around 11/12/2017) for [facility]PP [facility] for BP/CBG and DM management.  The patient was given clear instructions to go to ER or return to medical center if symptoms don't improve, worsen or new problems develop. The patient verbalized understanding. The patient was told to call to get lab results if they haven't heard anything in the next week.   This note has been created with  Surveyor, quantity. Any transcriptional errors are unintentional.    Aaron Chessman, MD, [place], Aaron Nelson, Aaron Nelson, Aaron Nelson and [place], Aaron Nelson   10/15/2017, 11:19 AM

## 2017-10-16 ENCOUNTER — Ambulatory Visit (HOSPITAL_BASED_OUTPATIENT_CLINIC_OR_DEPARTMENT_OTHER): Payer: Medicare Other

## 2017-10-16 DIAGNOSIS — R93429 Abnormal radiologic findings on diagnostic imaging of unspecified kidney: Secondary | ICD-10-CM | POA: Diagnosis not present

## 2017-10-16 DIAGNOSIS — R0602 Shortness of breath: Secondary | ICD-10-CM | POA: Diagnosis not present

## 2017-10-16 LAB — MYOCARDIAL PERFUSION IMAGING
CHL CUP NUCLEAR SDS: 1
CHL CUP NUCLEAR SSS: 1
LHR: 0.3
LV dias vol: 112 mL (ref 62–150)
LV sys vol: 57 mL
NUC STRESS TID: 0.99
Peak HR: 81 {beats}/min
Rest HR: 67 {beats}/min
SRS: 0

## 2017-10-16 MED ORDER — TECHNETIUM TC 99M TETROFOSMIN IV KIT
9.7000 | PACK | Freq: Once | INTRAVENOUS | Status: AC | PRN
  Administered 2017-10-16: 9.7 via INTRAVENOUS
  Filled 2017-10-16: qty 10

## 2017-10-16 MED ORDER — TECHNETIUM TC 99M TETROFOSMIN IV KIT
32.4000 | PACK | Freq: Once | INTRAVENOUS | Status: AC | PRN
  Administered 2017-10-16: 32.4 via INTRAVENOUS
  Filled 2017-10-16: qty 33

## 2017-10-16 MED ORDER — REGADENOSON 0.4 MG/5ML IV SOLN
0.4000 mg | Freq: Once | INTRAVENOUS | Status: AC
  Administered 2017-10-16: 0.4 mg via INTRAVENOUS

## 2017-10-17 ENCOUNTER — Ambulatory Visit (HOSPITAL_COMMUNITY)
Admission: RE | Admit: 2017-10-17 | Discharge: 2017-10-17 | Disposition: A | Discharge status: Home / Self Care | Payer: Medicare Other | Source: Ambulatory Visit | Attending: Internal Medicine | Admitting: Internal Medicine

## 2017-10-17 DIAGNOSIS — R0602 Shortness of breath: Secondary | ICD-10-CM | POA: Insufficient documentation

## 2017-10-17 DIAGNOSIS — R93429 Abnormal radiologic findings on diagnostic imaging of unspecified kidney: Secondary | ICD-10-CM | POA: Insufficient documentation

## 2017-10-17 DIAGNOSIS — N132 Hydronephrosis with renal and ureteral calculous obstruction: Secondary | ICD-10-CM | POA: Diagnosis not present

## 2017-10-20 DIAGNOSIS — I10 Essential (primary) hypertension: Secondary | ICD-10-CM | POA: Diagnosis not present

## 2017-10-20 DIAGNOSIS — Z48812 Encounter for surgical aftercare following surgery on the circulatory system: Secondary | ICD-10-CM | POA: Diagnosis not present

## 2017-10-20 DIAGNOSIS — E1165 Type 2 diabetes mellitus with hyperglycemia: Secondary | ICD-10-CM | POA: Diagnosis not present

## 2017-10-20 DIAGNOSIS — D696 Thrombocytopenia, unspecified: Secondary | ICD-10-CM | POA: Diagnosis not present

## 2017-10-20 DIAGNOSIS — J449 Chronic obstructive pulmonary disease, unspecified: Secondary | ICD-10-CM | POA: Diagnosis not present

## 2017-10-20 DIAGNOSIS — C9590 Leukemia, unspecified not having achieved remission: Secondary | ICD-10-CM | POA: Diagnosis not present

## 2017-10-21 DIAGNOSIS — Z48812 Encounter for surgical aftercare following surgery on the circulatory system: Secondary | ICD-10-CM | POA: Diagnosis not present

## 2017-10-21 DIAGNOSIS — J449 Chronic obstructive pulmonary disease, unspecified: Secondary | ICD-10-CM | POA: Diagnosis not present

## 2017-10-21 DIAGNOSIS — C9590 Leukemia, unspecified not having achieved remission: Secondary | ICD-10-CM | POA: Diagnosis not present

## 2017-10-21 DIAGNOSIS — D696 Thrombocytopenia, unspecified: Secondary | ICD-10-CM | POA: Diagnosis not present

## 2017-10-21 DIAGNOSIS — E1165 Type 2 diabetes mellitus with hyperglycemia: Secondary | ICD-10-CM | POA: Diagnosis not present

## 2017-10-21 DIAGNOSIS — I10 Essential (primary) hypertension: Secondary | ICD-10-CM | POA: Diagnosis not present

## 2017-10-22 ENCOUNTER — Telehealth: Payer: Self-pay | Admitting: *Deleted

## 2017-10-22 NOTE — Telephone Encounter (Signed)
"  Ths is Aaron Nelson calling in reference to my father.  Need to reschedule the November 2nd appointment to December.  Marya Amsler going out of town is why he can't come in on Friday.  If there is an appointment available in November we'll take it."  Call transferred to scheduler.  Denies further needs or questions at this time.

## 2017-10-24 ENCOUNTER — Ambulatory Visit: Payer: Medicare Other | Admitting: Hematology and Oncology

## 2017-10-24 ENCOUNTER — Other Ambulatory Visit: Payer: Medicare Other

## 2017-10-24 DIAGNOSIS — J449 Chronic obstructive pulmonary disease, unspecified: Secondary | ICD-10-CM | POA: Diagnosis not present

## 2017-10-24 DIAGNOSIS — C9590 Leukemia, unspecified not having achieved remission: Secondary | ICD-10-CM | POA: Diagnosis not present

## 2017-10-24 DIAGNOSIS — I10 Essential (primary) hypertension: Secondary | ICD-10-CM | POA: Diagnosis not present

## 2017-10-24 DIAGNOSIS — Z48812 Encounter for surgical aftercare following surgery on the circulatory system: Secondary | ICD-10-CM | POA: Diagnosis not present

## 2017-10-24 DIAGNOSIS — D696 Thrombocytopenia, unspecified: Secondary | ICD-10-CM | POA: Diagnosis not present

## 2017-10-24 DIAGNOSIS — E1165 Type 2 diabetes mellitus with hyperglycemia: Secondary | ICD-10-CM | POA: Diagnosis not present

## 2017-10-27 ENCOUNTER — Telehealth: Payer: Self-pay | Admitting: Internal Medicine

## 2017-10-27 NOTE — Telephone Encounter (Signed)
Patients care taker came in to request a change on the type of wheelchair. Patient does not want an electric wheelchair just a regular one. Please follow up thanks.

## 2017-10-28 DIAGNOSIS — E1165 Type 2 diabetes mellitus with hyperglycemia: Secondary | ICD-10-CM | POA: Diagnosis not present

## 2017-10-28 DIAGNOSIS — D696 Thrombocytopenia, unspecified: Secondary | ICD-10-CM | POA: Diagnosis not present

## 2017-10-28 DIAGNOSIS — Z48812 Encounter for surgical aftercare following surgery on the circulatory system: Secondary | ICD-10-CM | POA: Diagnosis not present

## 2017-10-28 DIAGNOSIS — C9590 Leukemia, unspecified not having achieved remission: Secondary | ICD-10-CM | POA: Diagnosis not present

## 2017-10-28 DIAGNOSIS — I10 Essential (primary) hypertension: Secondary | ICD-10-CM | POA: Diagnosis not present

## 2017-10-28 DIAGNOSIS — J449 Chronic obstructive pulmonary disease, unspecified: Secondary | ICD-10-CM | POA: Diagnosis not present

## 2017-10-29 DIAGNOSIS — C9590 Leukemia, unspecified not having achieved remission: Secondary | ICD-10-CM | POA: Diagnosis not present

## 2017-10-29 DIAGNOSIS — E1165 Type 2 diabetes mellitus with hyperglycemia: Secondary | ICD-10-CM | POA: Diagnosis not present

## 2017-10-29 DIAGNOSIS — I10 Essential (primary) hypertension: Secondary | ICD-10-CM | POA: Diagnosis not present

## 2017-10-29 DIAGNOSIS — D696 Thrombocytopenia, unspecified: Secondary | ICD-10-CM | POA: Diagnosis not present

## 2017-10-29 DIAGNOSIS — J449 Chronic obstructive pulmonary disease, unspecified: Secondary | ICD-10-CM | POA: Diagnosis not present

## 2017-10-29 DIAGNOSIS — Z48812 Encounter for surgical aftercare following surgery on the circulatory system: Secondary | ICD-10-CM | POA: Diagnosis not present

## 2017-10-31 ENCOUNTER — Encounter: Payer: Self-pay | Admitting: Hematology and Oncology

## 2017-10-31 ENCOUNTER — Other Ambulatory Visit (HOSPITAL_BASED_OUTPATIENT_CLINIC_OR_DEPARTMENT_OTHER): Payer: Medicare Other

## 2017-10-31 ENCOUNTER — Telehealth: Payer: Self-pay | Admitting: Hematology and Oncology

## 2017-10-31 ENCOUNTER — Ambulatory Visit (HOSPITAL_BASED_OUTPATIENT_CLINIC_OR_DEPARTMENT_OTHER): Payer: Medicare Other | Admitting: Hematology and Oncology

## 2017-10-31 VITALS — BP 121/65 | HR 80 | Temp 98.6°F | Resp 17 | Ht 65.0 in | Wt 145.1 lb

## 2017-10-31 DIAGNOSIS — D709 Neutropenia, unspecified: Secondary | ICD-10-CM

## 2017-10-31 DIAGNOSIS — D473 Essential (hemorrhagic) thrombocythemia: Secondary | ICD-10-CM

## 2017-10-31 DIAGNOSIS — R400 Somnolence: Secondary | ICD-10-CM | POA: Diagnosis not present

## 2017-10-31 DIAGNOSIS — D7581 Myelofibrosis: Secondary | ICD-10-CM

## 2017-10-31 DIAGNOSIS — D649 Anemia, unspecified: Secondary | ICD-10-CM

## 2017-10-31 DIAGNOSIS — Z1589 Genetic susceptibility to other disease: Secondary | ICD-10-CM

## 2017-10-31 LAB — COMPREHENSIVE METABOLIC PANEL
ALT: 27 U/L (ref 0–55)
AST: 49 U/L — ABNORMAL HIGH (ref 5–34)
Albumin: 3.4 g/dL — ABNORMAL LOW (ref 3.5–5.0)
Alkaline Phosphatase: 129 U/L (ref 40–150)
Anion Gap: 9 mEq/L (ref 3–11)
BUN: 24.4 mg/dL (ref 7.0–26.0)
CALCIUM: 9 mg/dL (ref 8.4–10.4)
CHLORIDE: 106 meq/L (ref 98–109)
CO2: 20 mEq/L — ABNORMAL LOW (ref 22–29)
Creatinine: 1.3 mg/dL (ref 0.7–1.3)
EGFR: 56 mL/min/{1.73_m2} — ABNORMAL LOW (ref >60)
Glucose: 252 mg/dl — ABNORMAL HIGH (ref 70–140)
POTASSIUM: 5 meq/L (ref 3.5–5.1)
Sodium: 136 mEq/L (ref 136–145)
Total Bilirubin: 1.01 mg/dL (ref 0.20–1.20)
Total Protein: 7.3 g/dL (ref 6.4–8.3)

## 2017-10-31 LAB — MANUAL DIFFERENTIAL
ALC: 2.1 10*3/uL (ref 0.9–3.3)
ANC (CHCC MAN DIFF): 3.7 10*3/uL (ref 1.5–6.5)
BAND NEUTROPHILS: 12 % — AB (ref 0–10)
BLASTS: 3 % — AB (ref 0–0)
Basophil: 2 % (ref 0–2)
EOS: 1 % (ref 0–7)
LYMPH: 23 % (ref 14–49)
MONO: 25 % — AB (ref 0–14)
Metamyelocytes: 2 % — ABNORMAL HIGH (ref 0–0)
Myelocytes: 6 % — ABNORMAL HIGH (ref 0–0)
PLT EST: ADEQUATE
SEG: 24 % — AB (ref 38–77)
VARIANT LYMPH: 2 % — AB (ref 0–0)
nRBC: 39 % — ABNORMAL HIGH (ref 0–0)

## 2017-10-31 LAB — LACTATE DEHYDROGENASE: LDH: 1520 U/L — ABNORMAL HIGH (ref 125–245)

## 2017-10-31 LAB — CBC & DIFF AND RETIC
HCT: 27.5 % — ABNORMAL LOW (ref 38.4–49.9)
HEMOGLOBIN: 8.6 g/dL — AB (ref 13.0–17.1)
IMMATURE RETIC FRACT: 30.7 % — AB (ref 3.00–10.60)
MCH: 22.8 pg — ABNORMAL LOW (ref 27.2–33.4)
MCHC: 31.2 g/dL — ABNORMAL LOW (ref 32.0–36.0)
MCV: 73.1 fL — AB (ref 79.3–98.0)
Platelets: 149 10*3/uL (ref 140–400)
RBC: 3.76 10*6/uL — ABNORMAL LOW (ref 4.20–5.82)
RDW: 25.5 % — AB (ref 11.0–14.6)
RETIC %: 2.26 % — AB (ref 0.80–1.80)
Retic Ct Abs: 84.98 10*3/uL (ref 34.80–93.90)
WBC: 8.3 10*3/uL (ref 4.0–10.3)

## 2017-10-31 LAB — IRON AND TIBC
%SAT: 13 % — ABNORMAL LOW (ref 20–55)
IRON: 36 ug/dL — AB (ref 42–163)
TIBC: 274 ug/dL (ref 202–409)
UIBC: 238 ug/dL (ref 117–376)

## 2017-10-31 LAB — FERRITIN: Ferritin: 346 ng/ml — ABNORMAL HIGH (ref 22–316)

## 2017-10-31 NOTE — Telephone Encounter (Signed)
Scheduled appt per 11/9 - unable to schedule for next week due to booked schedule and patient request only fridays - appts scheduled for 11/23

## 2017-11-03 ENCOUNTER — Telehealth (INDEPENDENT_AMBULATORY_CARE_PROVIDER_SITE_OTHER): Payer: Self-pay | Admitting: *Deleted

## 2017-11-03 NOTE — Telephone Encounter (Signed)
-----   Message from Tresa Garter, MD sent at 10/17/2017  3:58 PM EDT ----- Kidney ultrasound showed no mass or any significant kidney abnormality.

## 2017-11-03 NOTE — Telephone Encounter (Signed)
Patients son verified DOB Patient is aware of no mass or abnormalities being noted in kidney. Patients son is also aware of Rx for manual wheelchair being requested from PCP and sent to [facility] this week. No further questions at this time.

## 2017-11-04 DIAGNOSIS — I10 Essential (primary) hypertension: Secondary | ICD-10-CM | POA: Diagnosis not present

## 2017-11-04 DIAGNOSIS — C9590 Leukemia, unspecified not having achieved remission: Secondary | ICD-10-CM | POA: Diagnosis not present

## 2017-11-04 DIAGNOSIS — Z48812 Encounter for surgical aftercare following surgery on the circulatory system: Secondary | ICD-10-CM | POA: Diagnosis not present

## 2017-11-04 DIAGNOSIS — E1165 Type 2 diabetes mellitus with hyperglycemia: Secondary | ICD-10-CM | POA: Diagnosis not present

## 2017-11-04 DIAGNOSIS — D696 Thrombocytopenia, unspecified: Secondary | ICD-10-CM | POA: Diagnosis not present

## 2017-11-04 DIAGNOSIS — J449 Chronic obstructive pulmonary disease, unspecified: Secondary | ICD-10-CM | POA: Diagnosis not present

## 2017-11-05 DIAGNOSIS — E1165 Type 2 diabetes mellitus with hyperglycemia: Secondary | ICD-10-CM | POA: Diagnosis not present

## 2017-11-05 DIAGNOSIS — I10 Essential (primary) hypertension: Secondary | ICD-10-CM | POA: Diagnosis not present

## 2017-11-05 DIAGNOSIS — D696 Thrombocytopenia, unspecified: Secondary | ICD-10-CM | POA: Diagnosis not present

## 2017-11-05 DIAGNOSIS — J449 Chronic obstructive pulmonary disease, unspecified: Secondary | ICD-10-CM | POA: Diagnosis not present

## 2017-11-05 DIAGNOSIS — C9590 Leukemia, unspecified not having achieved remission: Secondary | ICD-10-CM | POA: Diagnosis not present

## 2017-11-05 DIAGNOSIS — Z48812 Encounter for surgical aftercare following surgery on the circulatory system: Secondary | ICD-10-CM | POA: Diagnosis not present

## 2017-11-06 ENCOUNTER — Telehealth: Payer: Self-pay | Admitting: Internal Medicine

## 2017-11-06 DIAGNOSIS — D696 Thrombocytopenia, unspecified: Secondary | ICD-10-CM | POA: Diagnosis not present

## 2017-11-06 DIAGNOSIS — C9590 Leukemia, unspecified not having achieved remission: Secondary | ICD-10-CM | POA: Diagnosis not present

## 2017-11-06 DIAGNOSIS — E1165 Type 2 diabetes mellitus with hyperglycemia: Secondary | ICD-10-CM | POA: Diagnosis not present

## 2017-11-06 DIAGNOSIS — Z48812 Encounter for surgical aftercare following surgery on the circulatory system: Secondary | ICD-10-CM | POA: Diagnosis not present

## 2017-11-06 DIAGNOSIS — I10 Essential (primary) hypertension: Secondary | ICD-10-CM | POA: Diagnosis not present

## 2017-11-06 DIAGNOSIS — J449 Chronic obstructive pulmonary disease, unspecified: Secondary | ICD-10-CM | POA: Diagnosis not present

## 2017-11-06 NOTE — Telephone Encounter (Signed)
[facility] called to request an order for, Plan of care.Please follow up

## 2017-11-07 ENCOUNTER — Telehealth: Payer: Self-pay | Admitting: *Deleted

## 2017-11-07 NOTE — Telephone Encounter (Signed)
Request order for 1 time visit on next visit on next week.  Unsure if he will be re certified for visits.

## 2017-11-09 NOTE — Progress Notes (Signed)
[facility] Cancer Follow-up Visit:  Assessment: Myelofibrosis (HCC) 83-year-old male with diagnosis of primary myelofibrosis with associated neutropenia without recurrent infections, anemia not requiring transfusions and thrombocytosis. Patient has been having hematological abnormalities from extended period of time, the earliest noted 2005. Patient underwent at least 2 bone marrow biopsy evaluations, most recent on 01/09/17 documenting the recurrent condition. IPSS score 3 -- high risk, mainly due to advanced age of the patient, anemia, and some systemic symptoms attribution of which is a little bit challenging. Patient's somnolence is not clearly related to myelofibrosis. He does not appear to have any problems with splenomegaly based on physical exam or symptoms and his appetite is excellent.   Patient continues to experience somnolence.  His hematological profile demonstrates progressive anemia.  This likely the anemia is due to progressive deterioration of the bone marrow synthetic function, although other etiologies are possible as well.  Management of progressive anemia discussed with the patient including possible initiation of ruxolitinib versus supportive care with transfusions.  Patient expresses lack of desire for any supportive care and is comfortable with observation and possible enrollment in hospice and this time which has surprised his son quite a bit.  Due to unexpected expression of healthcare preferences of the patient, I would like to see him back in my clinic in 1 week with the rest of his family so we can discuss that and make the appropriate arrangements to follow patient's healthcare wishes.  Plan: --Return to clinic in 1 week for family meeting.  Voice recognition software was used and creation of this note. Despite my best effort at editing the text, some misspelling/errors may have occurred.   No orders of the defined types were placed in this  encounter.   Cancer Staging No matching staging information was found for the patient.  All questions were answered.  . The patient knows to call the clinic with any problems, questions or concerns.  This note was electronically signed.    History of Presenting Illness Aaron Nelson 83 y.o. followed in the [facility] for diagnosis of myelofibrosis. Patient returns to the clinic to discuss on review of the previous records. No new complaints since last visit to the clinic. It appears that he no longer experiences headaches, lightheadedness, or dizziness as his blood pressure control has improved.  Review of the previous records demonstrates a bone marrow biopsy results consistent with primary myelofibrosis with positive testing for Jack 2 mutation. Initially, patient was treated with lenalidomide, but experienced a syncopal event medication was discontinued. Subsequently, patient received no additional therapy. At this time, his son describes excessive somnolence with patient being able to fall asleep in the middle of the day while reading or while watching TV. Nevertheless, patient does not full asleep while engaged with others. He appears to rest well through the night as well without snoring. He wakes up only to urinate. Patient denies any fevers, chills, night sweats. No weight loss, no abdominal pain, early satiety. His appetite is currently excellent. He does take describe dyspnea with exertion, but he is able to maintain reasonable activity level that he is satisfied with.  Patient returns to the clinic today for continued hematological monitoring.  Reports feeling "better" overall, but continues to experience significant somnolence even throughout the day.  No fevers, chills, night sweats.  No chest pain or shortness of breath, or cough.  No dysuria or hematuria.  Appetite remains good.  Hematological History: --BM Bx, 02/04/14: (Size decreased in number microcytosis and   polychromasia  without rouleaux no nucleated red blood cells. Leukocytosis decreased in number with absolute neutropenia and absolute lymphopenia. No increase in blasts. Platelets are normal in number. Aspirate demonstrates reasonably normal results. Overall cellularity 60% no evidence of lymphomatous infiltrate or increase in blasts. Flow cytometry positive for presence of a small lambda monoclonal CD5 negative, CD10 negative B-cell population.Cytogen -- 46,XY, del(9)(q13q22) --BM Bx, 01/09/17: Bone marrow aspirate a spicular and contained insufficient particles for evaluation. Peripheral blood smear demonstrated decreased erythrocytes with microcytosis hypochromasia, schistocytes, nucleated red blood cells. Decreased white blood cell count with absolute and relative neutropenia, and relative lymphocytosis and monocytosis. Overall bone marrow cellularity increased at approximately 80-85%, increased number of atypical megakaryocytes with extremely pleomorphic nuclei. Neutrophil maturation is partially arrested, no increase of blasts. Increased fibrosis.  Treatment Hx: --Lenalidomide, 02/05/17: Medication held single day of administration due to hospitalization with syncope.  Medical History: Past Medical History:  Diagnosis Date  . Diabetes mellitus without complication (HCC)   . Hypertension   . Myelodysplasia (myelodysplastic syndrome) (HCC)   . S/P placement of cardiac pacemaker 08/30/2017  . Sinus arrest   . Syncope   . Syncope and collapse 08/30/2017    Surgical History: Past Surgical History:  Procedure Laterality Date  . BONE MARROW BIOPSY  2017  . LOOP RECORDER REMOVAL N/A 08/29/2017   Performed by Klein, Steven C, MD at [facility]  . PACEMAKER IMPLANT Left 08/29/2017   St Jude generator  . Pacemaker Implant N/A 08/29/2017   Performed by Klein, Steven C, MD at [facility]  . PORTACATH PLACEMENT  2017    Family History: Family History  Problem Relation Age of Onset  . Hypertension  Mother     Social History: Social History   Socioeconomic History  . Marital status: Divorced    Spouse name: Not on file  . Number of children: Not on file  . Years of education: Not on file  . Highest education level: Not on file  Social Needs  . Financial resource strain: Not on file  . Food insecurity - worry: Not on file  . Food insecurity - inability: Not on file  . Transportation needs - medical: Not on file  . Transportation needs - non-medical: Not on file  Occupational History  . Not on file  Tobacco Use  . Smoking status: Never Smoker  . Smokeless tobacco: Never Used  Substance and Sexual Activity  . Alcohol use: No  . Drug use: No  . Sexual activity: Not on file  Other Topics Concern  . Not on file  Social History Narrative  . Not on file    Allergies: No Known Allergies  Medications:  Current Outpatient Medications  Medication Sig Dispense Refill  . acetaminophen (TYLENOL) 325 MG tablet Take 2 tablets (650 mg total) by mouth every 4 (four) hours as needed for mild pain.    . amLODipine (NORVASC) 2.5 MG tablet Take 1 tablet (2.5 mg total) by mouth daily. 90 tablet 3  . aspirin EC 81 MG tablet Take 81 mg by mouth daily.    . Insulin Glargine (LANTUS SOLOSTAR) 100 UNIT/ML Solostar Pen Inject 50 Units into the skin every morning. 15 mL 3  . isosorbide mononitrate (IMDUR) 30 MG 24 hr tablet Take 1 tablet (30 mg total) by mouth daily. 30 tablet 11  . lidocaine (XYLOCAINE) 2 % solution Apply small amount to painful gums with a qtip as needed.  Avoid swallowing 100 mL 0  .   NITROSTAT 0.4 MG SL tablet Place 1 tablet (0.4 mg total) under the tongue every 5 (five) minutes as needed for chest pain. 25 tablet 3   No current facility-administered medications for this visit.     Review of Systems: Review of Systems  Constitutional: Positive for fatigue.       Patient and his son report excessive somnolence with the patient being able to follow sleep any time not  actively engaged with other people.  Neurological: Negative for headaches, seizures and speech difficulty.  All other systems reviewed and are negative.    PHYSICAL EXAMINATION Blood pressure 121/65, pulse 80, temperature 98.6 F (37 C), temperature source Oral, resp. rate 17, height 5' 5" (1.651 m), weight 145 lb 1.6 oz (65.8 kg), SpO2 100 %.  ECOG PERFORMANCE STATUS: 2 - Symptomatic, <50% confined to bed  Physical Exam  Constitutional:  Elderly frail male in no acute distress  HENT:  Head: Normocephalic and atraumatic.  Mouth/Throat: Oropharynx is clear and moist. No oropharyngeal exudate.  Eyes: EOM are normal. Pupils are equal, round, and reactive to light. No scleral icterus.  Neck: No thyromegaly present.  Cardiovascular: Normal rate and regular rhythm.  No murmur heard. Pulmonary/Chest: Breath sounds normal. No respiratory distress. He has no wheezes.  Abdominal: Soft. He exhibits no distension.  No hepatosplenomegaly  Musculoskeletal: He exhibits no edema.  Lymphadenopathy:    He has no cervical adenopathy.  Neurological: He is alert. He has normal reflexes. No cranial nerve deficit.  Skin: Skin is warm. No rash noted. No pallor.      LABORATORY DATA: I have personally reviewed the data as listed: Appointment on 10/31/2017  Component Date Value Ref Range Status  . Iron 10/31/2017 36* 42 - 163 ug/dL Final  . TIBC 10/31/2017 274  202 - 409 ug/dL Final  . UIBC 10/31/2017 238  117 - 376 ug/dL Final  . %SAT 10/31/2017 13* 20 - 55 % Final  . Ferritin 10/31/2017 346* 22 - 316 ng/ml Final  . LDH 10/31/2017 1,520* 125 - 245 U/L Final  . Sodium 10/31/2017 136  136 - 145 mEq/L Final  . Potassium 10/31/2017 5.0  3.5 - 5.1 mEq/L Final  . Chloride 10/31/2017 106  98 - 109 mEq/L Final  . CO2 10/31/2017 20* 22 - 29 mEq/L Final  . Glucose 10/31/2017 252* 70 - 140 mg/dl Final   Glucose reference range is for nonfasting patients. Fasting glucose reference range is 70- 100.  Marland Kitchen  BUN 10/31/2017 24.4  7.0 - 26.0 mg/dL Final  . Creatinine 10/31/2017 1.3  0.7 - 1.3 mg/dL Final  . Total Bilirubin 10/31/2017 1.01  0.20 - 1.20 mg/dL Final  . Alkaline Phosphatase 10/31/2017 129  40 - 150 U/L Final  . AST 10/31/2017 49* 5 - 34 U/L Final  . ALT 10/31/2017 27  0 - 55 U/L Final  . Total Protein 10/31/2017 7.3  6.4 - 8.3 g/dL Final  . Albumin 10/31/2017 3.4* 3.5 - 5.0 g/dL Final  . Calcium 10/31/2017 9.0  8.4 - 10.4 mg/dL Final  . Anion Gap 10/31/2017 9  3 - 11 mEq/L Final  . EGFR 10/31/2017 56* >60 ml/min/1.73 m2 Final   eGFR is calculated using the CKD-EPI Creatinine Equation (2009)  . WBC 10/31/2017 8.3  4.0 - 10.3 10e3/uL Final  . HGB 10/31/2017 8.6* 13.0 - 17.1 g/dL Final  . HCT 10/31/2017 27.5* 38.4 - 49.9 % Final  . Platelets 10/31/2017 149  140 - 400 10e3/uL Final  . MCV  10/31/2017 73.1* 79.3 - 98.0 fL Final  . MCH 10/31/2017 22.8* 27.2 - 33.4 pg Final  . MCHC 10/31/2017 31.2* 32.0 - 36.0 g/dL Final  . RBC 10/31/2017 3.76* 4.20 - 5.82 10e6/uL Final  . RDW 10/31/2017 25.5* 11.0 - 14.6 % Final  . Retic % 10/31/2017 2.26* 0.80 - 1.80 % Final  . Retic Ct Abs 10/31/2017 84.98  34.80 - 93.90 10e3/uL Final  . Immature Retic Fract 10/31/2017 30.70* 3.00 - 10.60 % Final  . ANC ([facility] manual diff) 10/31/2017 3.7  1.5 - 6.5 10e3/uL Final  . ALC 10/31/2017 2.1  0.9 - 3.3 10e3/uL Final  . SEG 10/31/2017 24* 38 - 77 % Final  . Band Neutrophils 10/31/2017 12* 0 - 10 % Final  . LYMPH 10/31/2017 23  14 - 49 % Final  . MONO 10/31/2017 25* 0 - 14 % Final  . EOS 10/31/2017 1  0 - 7 % Final  . Basophil 10/31/2017 2  0 - 2 % Final  . Metamyelocytes 10/31/2017 2* 0 - 0 % Final  . Myelocytes 10/31/2017 6* 0 - 0 % Final  . Blasts 10/31/2017 3* 0 - 0 % Final  . Variant Lymph 10/31/2017 2* 0 - 0 % Final  . nRBC 10/31/2017 39* 0 - 0 % Final  . Polychromasia 10/31/2017 Slight  Slight Final  . Tear Drop Cells 10/31/2017 Few  Negative Final  . Ovalocytes 10/31/2017 Moderate  Negative  Final  . PLT EST 10/31/2017 Adequate  Adequate Final  . Platelet Morphology 10/31/2017 Giant Platelets  Within Normal Limits Final        G , MD  

## 2017-11-09 NOTE — Assessment & Plan Note (Signed)
83 year old male with diagnosis of primary myelofibrosis with associated neutropenia without recurrent infections, anemia not requiring transfusions and thrombocytosis. Patient has been having hematological abnormalities from extended period of time, the earliest noted 2005. Patient underwent at least 2 bone marrow biopsy evaluations, most recent on 01/09/17 documenting the recurrent condition. IPSS score 3 -- high risk, mainly due to advanced age of the patient, anemia, and some systemic symptoms attribution of which is a little bit challenging. Patient's somnolence is not clearly related to myelofibrosis. He does not appear to have any problems with splenomegaly based on physical exam or symptoms and his appetite is excellent.   Patient continues to experience somnolence.  His hematological profile demonstrates progressive anemia.  This likely the anemia is due to progressive deterioration of the bone marrow synthetic function, although other etiologies are possible as well.  Management of progressive anemia discussed with the patient including possible initiation of ruxolitinib versus supportive care with transfusions.  Patient expresses lack of desire for any supportive care and is comfortable with observation and possible enrollment in hospice and this time which has surprised his son quite a bit.  Due to unexpected expression of healthcare preferences of the patient, I would like to see him back in my clinic in 1 week with the rest of his family so we can discuss that and make the appropriate arrangements to follow patient's healthcare wishes.  Plan: --Return to clinic in 1 week for family meeting.  Voice recognition software was used and creation of this note. Despite my best effort at editing the text, some misspelling/errors may have occurred.

## 2017-11-10 NOTE — Telephone Encounter (Signed)
Prescribed

## 2017-11-11 DIAGNOSIS — J449 Chronic obstructive pulmonary disease, unspecified: Secondary | ICD-10-CM | POA: Diagnosis not present

## 2017-11-11 DIAGNOSIS — I10 Essential (primary) hypertension: Secondary | ICD-10-CM | POA: Diagnosis not present

## 2017-11-11 DIAGNOSIS — Z48812 Encounter for surgical aftercare following surgery on the circulatory system: Secondary | ICD-10-CM | POA: Diagnosis not present

## 2017-11-11 DIAGNOSIS — D696 Thrombocytopenia, unspecified: Secondary | ICD-10-CM | POA: Diagnosis not present

## 2017-11-11 DIAGNOSIS — C9590 Leukemia, unspecified not having achieved remission: Secondary | ICD-10-CM | POA: Diagnosis not present

## 2017-11-11 DIAGNOSIS — E1165 Type 2 diabetes mellitus with hyperglycemia: Secondary | ICD-10-CM | POA: Diagnosis not present

## 2017-11-14 ENCOUNTER — Telehealth: Payer: Self-pay

## 2017-11-14 ENCOUNTER — Ambulatory Visit: Payer: Medicare Other | Admitting: Hematology and Oncology

## 2017-11-14 NOTE — Telephone Encounter (Signed)
Attempt to call pt to reschedule f/u appt on 11/30.

## 2017-11-14 NOTE — Telephone Encounter (Signed)
Spoke with patient son. Family and patient are out of town, and thought "the appointment was scheduled for next week." Visit is for goals of care. Dr. Lebron Conners okay to see patient and family next week. Scheduling message sent for doctor visit 11/30.

## 2017-11-17 ENCOUNTER — Telehealth: Payer: Self-pay | Admitting: Hematology and Oncology

## 2017-11-17 NOTE — Telephone Encounter (Signed)
Rescheduled an appointment that was missed

## 2017-11-19 ENCOUNTER — Telehealth: Payer: Self-pay

## 2017-11-19 ENCOUNTER — Telehealth: Payer: Self-pay | Admitting: Pharmacist

## 2017-11-19 ENCOUNTER — Ambulatory Visit (HOSPITAL_BASED_OUTPATIENT_CLINIC_OR_DEPARTMENT_OTHER): Payer: Medicare Other | Admitting: Hematology and Oncology

## 2017-11-19 ENCOUNTER — Ambulatory Visit: Payer: Medicare Other | Admitting: Hematology and Oncology

## 2017-11-19 ENCOUNTER — Ambulatory Visit (HOSPITAL_BASED_OUTPATIENT_CLINIC_OR_DEPARTMENT_OTHER): Payer: Medicare Other

## 2017-11-19 ENCOUNTER — Encounter: Payer: Self-pay | Admitting: Hematology and Oncology

## 2017-11-19 ENCOUNTER — Telehealth: Payer: Self-pay | Admitting: Pharmacy Technician

## 2017-11-19 ENCOUNTER — Telehealth: Payer: Self-pay | Admitting: Hematology and Oncology

## 2017-11-19 VITALS — BP 152/83 | HR 70 | Resp 17 | Ht 65.0 in | Wt 147.2 lb

## 2017-11-19 DIAGNOSIS — Z1589 Genetic susceptibility to other disease: Secondary | ICD-10-CM

## 2017-11-19 DIAGNOSIS — R0609 Other forms of dyspnea: Secondary | ICD-10-CM

## 2017-11-19 DIAGNOSIS — R400 Somnolence: Secondary | ICD-10-CM | POA: Diagnosis not present

## 2017-11-19 DIAGNOSIS — D7581 Myelofibrosis: Secondary | ICD-10-CM | POA: Diagnosis not present

## 2017-11-19 DIAGNOSIS — R0602 Shortness of breath: Secondary | ICD-10-CM

## 2017-11-19 DIAGNOSIS — D649 Anemia, unspecified: Secondary | ICD-10-CM | POA: Diagnosis not present

## 2017-11-19 LAB — MANUAL DIFFERENTIAL
ALC: 4.1 10*3/uL — ABNORMAL HIGH (ref 0.9–3.3)
ANC (CHCC manual diff): 2.2 10*3/uL (ref 1.5–6.5)
BASOPHIL: 8 % — AB (ref 0–2)
Blasts: 4 % — ABNORMAL HIGH (ref 0–0)
EOS%: 3 % (ref 0–7)
LYMPH: 47 % (ref 14–49)
METAMYELOCYTES PCT: 2 % — AB (ref 0–0)
MONO: 13 % (ref 0–14)
Myelocytes: 2 % — ABNORMAL HIGH (ref 0–0)
PLT EST: ADEQUATE
SEG: 21 % — ABNORMAL LOW (ref 38–77)
nRBC: 68 % — ABNORMAL HIGH (ref 0–0)

## 2017-11-19 LAB — CBC WITH DIFFERENTIAL/PLATELET
HEMATOCRIT: 32.2 % — AB (ref 38.4–49.9)
HEMOGLOBIN: 9.8 g/dL — AB (ref 13.0–17.1)
MCH: 22.8 pg — AB (ref 27.2–33.4)
MCHC: 30.4 g/dL — AB (ref 32.0–36.0)
MCV: 74.9 fL — AB (ref 79.3–98.0)
Platelets: 240 10*3/uL (ref 140–400)
RBC: 4.3 10*6/uL (ref 4.20–5.82)
RDW: 26.2 % — ABNORMAL HIGH (ref 11.0–14.6)
WBC: 8.6 10*3/uL (ref 4.0–10.3)

## 2017-11-19 MED ORDER — RUXOLITINIB PHOSPHATE 10 MG PO TABS: 10.0000 mg | ORAL_TABLET | Freq: Two times a day (BID) | ORAL | 0 refills | Status: DC

## 2017-11-19 MED ORDER — RUXOLITINIB PHOSPHATE 15 MG PO TABS: 15.0000 mg | ORAL_TABLET | Freq: Two times a day (BID) | ORAL | 0 refills | Status: DC

## 2017-11-19 NOTE — Telephone Encounter (Signed)
Oral Oncology Pharmacist Encounter  Received new prescription for Jakafi (ruxolitinib) for the treatment of myelofibrosis, planned duration based on tolerability and disease response.  Labs from 11/9 and 11/28 assessed, OK for treatment. Noted SCr=1.3, est CrCl~35-40 mL/min.  No dose adjustments needed per manufacturer for MF with pltc>150k (pt's pltc=254). Dose reduction to 10mg  BID with CrCl 15-59 and pltc<150k. D/w MD, pt already prescribed reduced dose (15mg  BID) due to possible toleration issues. Will decrease further to 10mg  BID for Jakafi initiation.  Current medication list in Epic reviewed, no DDIs with Jakafi identified.  Prescription has been e-scribed to the [facility] for benefits analysis and approval. Prior authorization has been submitted and is pending.  Oral Oncology Clinic will continue to follow for insurance authorization, copayment issues, initial counseling and start date.  Johny Drilling, PharmD, BCPS, BCOP 11/19/2017 5:04 PM Oral Oncology Clinic [phone]

## 2017-11-19 NOTE — Telephone Encounter (Signed)
Dr. Lebron Conners aware of hemoglobin of 9.8. Lab notified to cancel hold tube. Dr. Lebron Conners also ordered CT Angio Chest. [name]-Eric made aware of CT order and informed to expect a call from [place]. [name] given the number to [employer] as well.

## 2017-11-19 NOTE — Telephone Encounter (Signed)
Oral Oncology Patient Advocate Encounter  Received notification from [place] that prior authorization for Aaron Nelson is required.  PA submitted on CoverMyMeds Key [id number] Status is pending  Oral Oncology Clinic will continue to follow.  [name]. [name], [place] Patient [place] [phone] 11/19/2017 11:39 AM

## 2017-11-19 NOTE — Telephone Encounter (Signed)
Scheduled appt per 11/28 los- Gave patient AVS and calender per los.  

## 2017-11-19 NOTE — Telephone Encounter (Signed)
Talk with patients brother they wanted to still come in today

## 2017-11-20 ENCOUNTER — Ambulatory Visit: Payer: Medicare Other | Attending: Internal Medicine | Admitting: Pharmacist

## 2017-11-20 ENCOUNTER — Encounter: Payer: Self-pay | Admitting: Pharmacist

## 2017-11-20 VITALS — BP 147/78 | HR 92

## 2017-11-20 DIAGNOSIS — IMO0001 Reserved for inherently not codable concepts without codable children: Secondary | ICD-10-CM

## 2017-11-20 DIAGNOSIS — Z794 Long term (current) use of insulin: Secondary | ICD-10-CM | POA: Diagnosis not present

## 2017-11-20 DIAGNOSIS — E1165 Type 2 diabetes mellitus with hyperglycemia: Secondary | ICD-10-CM | POA: Insufficient documentation

## 2017-11-20 LAB — GLUCOSE, POCT (MANUAL RESULT ENTRY): POC GLUCOSE: 136 mg/dL — AB (ref 70–99)

## 2017-11-20 MED ORDER — RUXOLITINIB PHOSPHATE 10 MG PO TABS: 10.0000 mg | ORAL_TABLET | Freq: Two times a day (BID) | ORAL | 0 refills | Status: DC

## 2017-11-20 MED ORDER — ACCU-CHEK FASTCLIX LANCETS MISC: 12 refills | Status: AC

## 2017-11-20 MED ORDER — GLUCOSE BLOOD VI STRP: ORAL_STRIP | 12 refills | Status: DC

## 2017-11-20 MED ORDER — ACCU-CHEK AVIVA PLUS W/DEVICE KIT: PACK | 0 refills | Status: AC

## 2017-11-20 NOTE — Patient Instructions (Addendum)
Thanks for coming to see [place]!  Schedule an appointment to see Dr. Doreene Burke for urinary issues (next available)  If you have blood sugars less than 80 or multiple higher than 200, come back and see me in 1 month.  I sent the new meter and supplies to [facility]   Hypoglycemia Hypoglycemia is when the sugar (glucose) level in the blood is too low. Symptoms of low blood sugar may include:  Feeling: ? Hungry. ? Worried or nervous (anxious). ? Sweaty and clammy. ? Confused. ? Dizzy. ? Sleepy. ? Sick to your stomach (nauseous).  Having: ? A fast heartbeat. ? A headache. ? A change in your vision. ? Jerky movements that you cannot control (seizure). ? Nightmares. ? Tingling or no feeling (numbness) around the mouth, lips, or tongue.  Having trouble with: ? Talking. ? Paying attention (concentrating). ? Moving (coordination). ? Sleeping.  Shaking.  Passing out (fainting).  Getting upset easily (irritability).  Low blood sugar can happen to people who have diabetes and people who do not have diabetes. Low blood sugar can happen quickly, and it can be an emergency. Treating Low Blood Sugar Low blood sugar is often treated by eating or drinking something sugary right away. If you can think clearly and swallow safely, follow the 15:15 rule:  Take 15 grams of a fast-acting carb (carbohydrate). Some fast-acting carbs are: ? 1 tube of glucose gel. ? 3 sugar tablets (glucose pills). ? 6-8 pieces of hard candy. ? 4 oz (120 mL) of fruit juice. ? 4 oz (120 mL) of regular (not diet) soda.  Check your blood sugar 15 minutes after you take the carb.  If your blood sugar is still at or below 70 mg/dL (3.9 mmol/L), take 15 grams of a carb again.  If your blood sugar does not go above 70 mg/dL (3.9 mmol/L) after 3 tries, get help right away.  After your blood sugar goes back to normal, eat a meal or a snack within 1 hour.  Treating Very Low Blood Sugar If your blood sugar is at or  below 54 mg/dL (3 mmol/L), you have very low blood sugar (severe hypoglycemia). This is an emergency. Do not wait to see if the symptoms will go away. Get medical help right away. Call your local emergency services (911 in the [place] Do not drive yourself to the hospital. If you have very low blood sugar and you cannot eat or drink, you may need a glucagon shot (injection). A family member or friend should learn how to check your blood sugar and how to give you a glucagon shot. Ask your doctor if you need to have a glucagon shot kit at home. Follow these instructions at home: General instructions  Avoid any diets that cause you to not eat enough food. Talk with your doctor before you start any new diet.  Take over-the-counter and prescription medicines only as told by your doctor.  Limit alcohol to no more than 1 drink per day for nonpregnant women and 2 drinks per day for men. One drink equals 12 oz of beer, 5 oz of wine, or 1 oz of hard liquor.  Keep all follow-up visits as told by your doctor. This is important. If You Have Diabetes:   Make sure you know the symptoms of low blood sugar.  Always keep a source of sugar with you, such as: ? Sugar. ? Sugar tablets. ? Glucose gel. ? Fruit juice. ? Regular soda (not diet soda). ? Milk. ? Hard  candy. ? Honey.  Take your medicines as told.  Follow your exercise and meal plan. ? Eat on time. Do not skip meals. ? Follow your sick day plan when you cannot eat or drink normally. Make this plan ahead of time with your doctor.  Check your blood sugar as often as told by your doctor. Always check before and after exercise.  Share your diabetes care plan with: ? Your work or school. ? People you live with.  Check your pee (urine) for ketones: ? When you are sick. ? As told by your doctor.  Carry a card or wear jewelry that says you have diabetes. If You Have Low Blood Sugar From Other Causes:   Check your blood sugar as often as  told by your doctor.  Follow instructions from your doctor about what you cannot eat or drink. Contact a doctor if:  You have trouble keeping your blood sugar in your target range.  You have low blood sugar often. Get help right away if:  You still have symptoms after you eat or drink something sugary.  Your blood sugar is at or below 54 mg/dL (3 mmol/L).  You have jerky movements that you cannot control.  You pass out. These symptoms may be an emergency. Do not wait to see if the symptoms will go away. Get medical help right away. Call your local emergency services (911 in the [place] Do not drive yourself to the hospital. This information is not intended to replace advice given to you by your health care provider. Make sure you discuss any questions you have with your health care provider. Document Released: 03/05/2010 Document Revised: 05/16/2016 Document Reviewed: 01/12/2016 Elsevier Interactive Patient Education  [employer].

## 2017-11-20 NOTE — Progress Notes (Signed)
    S:     Chief Complaint  Patient presents with  . Medication Management    Patient arrives in good spirits.  Presents for diabetes evaluation, education, and management at the request of D. Jegede. Patient was referred on 10/15/17.  Patient was last seen by Primary Care Provider on 10/15/17.   Of note, patient with a myelofibrosis diagnosis and is being followed by oncology.  Patient reports adherence with medications.  Current diabetes medications include: Lantus 50 units daily  Patient denies hypoglycemic events.   He reports that he checks his blood pressures at home with a wrist cuff that he has had for a long time. It ranges 106/95 to 155/106 with pulses WNL up to 112. His son reports that he does not think the cuff works that well.   Patient reports that he needs new strips for his meter. He has a Audiological scientist.  Patient has a bump on the back of his head that his son is concerned about. He reports that is popped up a month ago and has slowly gotten larger. Patient denies any pain, fever, redness, headaches, visual changes, discharge from the bump.   O:  Physical Exam   ROS   Lab Results  Component Value Date   HGBA1C 13.5 10/15/2017   Vitals:   11/20/17 0939 11/20/17 1002  BP: (!) 153/78 (!) 147/78  Pulse: 92 92    Lab Results  Component Value Date   WBC 8.6 11/19/2017   HGB 9.8 (L) 11/19/2017   HCT 32.2 (L) 11/19/2017   MCV 74.9 (L) 11/19/2017   PLT 240 11/19/2017   POCT glucose = 136  Home fasting CBG: 100s-130s 2 hour post-prandial/random CBG: 100s-180s, highest 300s  A/P: Diabetes longstanding currently uncontrolled based on A1c of 13.5 (which may not be accurate due to anemia). Patient denies hypoglycemic events and is able to verbalize appropriate hypoglycemia management plan. Patient reports adherence with medication. Control is suboptimal due to dietary indiscretion.  Continue current Lantus dose of 50 unit daily. Patient was on Novolog in  the past but stopped due to hypoglycemia and he would like to not restart that. Most blood sugars at home are in the 100s with the occasional 300 once every other week or so. I could not find a [employer] meter in the system but patient has [employer] and Accu-chek preferred so Acc[employer]-che[employer] supplies sent in. Next A1C anticipated January 2019.    Freeman Caldron evaluated bump and said it was likely a lipoma or a sebaceous cyst and patient can be referred for removal but can also let it be for now. Encouraged patient to monitor for any fever, headache, chills, or discharge, and to discuss with Dr. Doreene Burke at next visit.   History of hypertension, currently 147/78 and pulse of 92. No changes to current medications, will continue to monitor.   Written patient instructions provided.  Total time in face to face counseling 30 minutes.   Follow up in Pharmacist Clinic Visit in 1 month if patient has consistent hyperglycemia or any hypoglycemia. Otherwise, next visit with Dr. Doreene Burke for urinary difficulty.   Patient seen with [place], PharmD Candidate

## 2017-11-21 NOTE — Telephone Encounter (Signed)
Oral Oncology Patient Advocate Encounter  Prior Authorization for Aaron Nelson has been approved.    Effective dates: 11/21/2017 through 05/20/2018  Oral Oncology Clinic will continue to follow.   [name]. [name], [place] [facility] [place] [phone] 11/21/2017 12:57 PM

## 2017-11-21 NOTE — Telephone Encounter (Signed)
Faxed to [facility], son received hard copy in office.

## 2017-11-21 NOTE — Telephone Encounter (Signed)
Oral Chemotherapy Pharmacist Encounter   Attempted to reach patient to provide update and offer initial counseling for: [place]. No answer. Left VM for patient to call back with any questions or issues.   Oral Oncology Clinic will continue to follow.  Thank you,  Johny Drilling, PharmD, BCPS, BCOP 11/21/2017  1:04 PM  Oral Oncology Clinic [phone]

## 2017-11-24 NOTE — Telephone Encounter (Signed)
Oral Chemotherapy Pharmacist Encounter   Attempted to reach patient to provide update and offer initial counseling for: [place]. No answer. Left VM for patient to call back with any questions or issues.   Oral Oncology Clinic will continue to follow.  Thank you,  Johny Drilling, PharmD, BCPS, BCOP 11/24/2017     9:28 AM  Oral Oncology Clinic [phone]

## 2017-11-25 MED FILL — JAKAFI 10 MG TABLET: 10 | 30 days supply | Qty: 60 | Fill #0

## 2017-11-25 NOTE — Telephone Encounter (Signed)
Oral Chemotherapy Pharmacist Encounter   I spoke with patient's son, [name], for overview of: Jakafi.   Counseled son on administration, dosing, side effects, monitoring, drug-food interactions, safe handling, storage, and disposal.  Patient will take Jakafi 10mg  tablets, 1 tablet by mouth 2 times daily, with or without food. Son knows patient should avoid grapefruit and grapefruit juice. Jakafi start date: 11/27/17  Side effects include but not limited to: decreased blood counts, increased lipid profile, increased cholesterol, dizziness, and headacahe.    Reviewed importance of keeping a medication schedule and plan for any missed doses.  Eric voiced understanding and appreciation.   All questions answered. Medication reconciliation performed and medication/allergy list updated.  [name] will pick up the [facility] from the [facility] on 11/26/17 when they are at the hospital for CT scan.  The copayment is $8.35 as patient has active LIS through [employer]. [name] states this is affordable at this time.  Office visits on 01/01/18 confirmed with [name].  [name] knows to call the office with questions or concerns. Oral Oncology Clinic will continue to follow.  Thank you,  Johny Drilling, PharmD, BCPS, BCOP 11/25/2017   1:52 PM Oral Oncology Clinic [phone]

## 2017-11-26 ENCOUNTER — Ambulatory Visit (HOSPITAL_COMMUNITY)
Admission: RE | Admit: 2017-11-26 | Discharge: 2017-11-26 | Disposition: A | Discharge status: Home / Self Care | Payer: Medicare Other | Source: Ambulatory Visit | Attending: Hematology and Oncology | Admitting: Hematology and Oncology

## 2017-11-26 ENCOUNTER — Encounter (HOSPITAL_COMMUNITY): Payer: Self-pay

## 2017-11-26 DIAGNOSIS — R0602 Shortness of breath: Secondary | ICD-10-CM | POA: Diagnosis not present

## 2017-11-26 MED ORDER — IOPAMIDOL (ISOVUE-370) INJECTION 76%
INTRAVENOUS | Status: AC
  Administered 2017-11-26: 80 mL via INTRAVENOUS
  Filled 2017-11-26: qty 100

## 2017-11-26 MED ORDER — IOPAMIDOL (ISOVUE-370) INJECTION 76%
INTRAVENOUS | Status: AC
  Filled 2017-11-26: qty 100

## 2017-11-26 MED ORDER — IOPAMIDOL (ISOVUE-370) INJECTION 76%
100.0000 mL | Freq: Once | INTRAVENOUS | Status: AC | PRN
  Administered 2017-11-26: 100 mL via INTRAVENOUS

## 2017-12-05 ENCOUNTER — Emergency Department (HOSPITAL_COMMUNITY): Payer: Medicare Other

## 2017-12-05 ENCOUNTER — Telehealth: Payer: Self-pay | Admitting: Physician Assistant

## 2017-12-05 ENCOUNTER — Other Ambulatory Visit: Payer: Self-pay

## 2017-12-05 ENCOUNTER — Encounter: Payer: Self-pay | Admitting: Internal Medicine

## 2017-12-05 ENCOUNTER — Emergency Department (HOSPITAL_COMMUNITY)
Admission: EM | Admit: 2017-12-05 | Discharge: 2017-12-06 | Disposition: A | Discharge status: Home / Self Care | Payer: Medicare Other | Attending: Emergency Medicine | Admitting: Emergency Medicine

## 2017-12-05 DIAGNOSIS — I10 Essential (primary) hypertension: Secondary | ICD-10-CM | POA: Diagnosis not present

## 2017-12-05 DIAGNOSIS — R531 Weakness: Secondary | ICD-10-CM | POA: Diagnosis not present

## 2017-12-05 DIAGNOSIS — Z794 Long term (current) use of insulin: Secondary | ICD-10-CM | POA: Diagnosis not present

## 2017-12-05 DIAGNOSIS — Z7982 Long term (current) use of aspirin: Secondary | ICD-10-CM | POA: Diagnosis not present

## 2017-12-05 DIAGNOSIS — M6281 Muscle weakness (generalized): Secondary | ICD-10-CM | POA: Diagnosis present

## 2017-12-05 DIAGNOSIS — R0609 Other forms of dyspnea: Secondary | ICD-10-CM | POA: Diagnosis not present

## 2017-12-05 DIAGNOSIS — E119 Type 2 diabetes mellitus without complications: Secondary | ICD-10-CM | POA: Insufficient documentation

## 2017-12-05 DIAGNOSIS — Z950 Presence of cardiac pacemaker: Secondary | ICD-10-CM | POA: Diagnosis not present

## 2017-12-05 DIAGNOSIS — Z79899 Other long term (current) drug therapy: Secondary | ICD-10-CM | POA: Insufficient documentation

## 2017-12-05 DIAGNOSIS — R0602 Shortness of breath: Secondary | ICD-10-CM | POA: Diagnosis not present

## 2017-12-05 LAB — CBC
HCT: 33.6 % — ABNORMAL LOW (ref 39.0–52.0)
HEMOGLOBIN: 10.6 g/dL — AB (ref 13.0–17.0)
MCH: 23.2 pg — ABNORMAL LOW (ref 26.0–34.0)
MCHC: 31.5 g/dL (ref 30.0–36.0)
MCV: 73.5 fL — ABNORMAL LOW (ref 78.0–100.0)
Platelets: 270 10*3/uL (ref 150–400)
RBC: 4.57 MIL/uL (ref 4.22–5.81)
RDW: 25.3 % — AB (ref 11.5–15.5)
WBC: 12.3 10*3/uL — AB (ref 4.0–10.5)

## 2017-12-05 LAB — CBG MONITORING, ED: GLUCOSE-CAPILLARY: 347 mg/dL — AB (ref 65–99)

## 2017-12-05 LAB — BASIC METABOLIC PANEL
ANION GAP: 9 (ref 5–15)
BUN: 31 mg/dL — ABNORMAL HIGH (ref 6–20)
CALCIUM: 9.6 mg/dL (ref 8.9–10.3)
CO2: 23 mmol/L (ref 22–32)
Chloride: 99 mmol/L — ABNORMAL LOW (ref 101–111)
Creatinine, Ser: 1.53 mg/dL — ABNORMAL HIGH (ref 0.61–1.24)
GFR calc Af Amer: 46 mL/min — ABNORMAL LOW (ref >60)
GFR, EST NON AFRICAN AMERICAN: 40 mL/min — AB (ref >60)
GLUCOSE: 366 mg/dL — AB (ref 65–99)
Potassium: 5 mmol/L (ref 3.5–5.1)
SODIUM: 131 mmol/L — AB (ref 135–145)

## 2017-12-05 NOTE — Telephone Encounter (Signed)
[name] called answering service, states he believes he received a call from our office phone number and was returning call. He does not know what this pertained to. I do not see any documentation from any recent device checks, lab or procedure reports or any telephone notes indicating why this would have been the case. He does have an appt scheduled 12/19 so I suspect this was just a reminder auto-call. Pt is being seen in the ED for generalized weakness. Will forward to Dr. Caryl Comes as Juluis Rainier. Devonn Giampietro PA-C

## 2017-12-05 NOTE — ED Triage Notes (Signed)
Patient states he has been having intermittent weakness x 3 months. Patient states that he has been seeing his doctor for this.

## 2017-12-05 NOTE — ED Notes (Signed)
Pt given urinal and aware urine sample is needed. 

## 2017-12-06 ENCOUNTER — Emergency Department (HOSPITAL_COMMUNITY): Payer: Medicare Other

## 2017-12-06 DIAGNOSIS — R531 Weakness: Secondary | ICD-10-CM | POA: Diagnosis not present

## 2017-12-06 LAB — URINALYSIS, ROUTINE W REFLEX MICROSCOPIC
BACTERIA UA: NONE SEEN
Bilirubin Urine: NEGATIVE
Hgb urine dipstick: NEGATIVE
KETONES UR: NEGATIVE mg/dL
Leukocytes, UA: NEGATIVE
NITRITE: NEGATIVE
PH: 5 (ref 5.0–8.0)
Protein, ur: NEGATIVE mg/dL
Specific Gravity, Urine: 1.023 (ref 1.005–1.030)

## 2017-12-06 NOTE — ED Provider Notes (Addendum)
[facility] DEPT Provider Note   CSN: [id number] Arrival date & time: 12/05/17  1752     History   Chief Complaint Chief Complaint  Patient presents with  . Weakness    HPI Aaron Nelson is a 83 y.o. male.  The history is provided by the patient, medical records and a relative.   Patient is a 83 year old male was brought to the emergency department once without associated fever.  Mild decreased oral intake.  No significant weight loss.  Reports some shortness of breath with exertion.  Denies orthopnea.  Has been seeing his primary care physician about this but no clear answer has been found.  Denies unilateral arm or leg weakness.  He has a history of myelofibrosis and is being followed closely by oncology.  There is been good compliance with his medications.  No dysuria or urinary frequency.  Past Medical History:  Diagnosis Date  . Diabetes mellitus without complication ([place])   . Hypertension   . Myelodysplasia (myelodysplastic syndrome) ([place])   . S/P placement of cardiac pacemaker 08/30/2017  . Sinus arrest   . Syncope   . Syncope and collapse 08/30/2017    Patient Active Problem List   Diagnosis Date Noted  . Abnormal CT scan, kidney 10/15/2017  . Syncope and collapse 08/30/2017  . Sinus arrest 20 sec with syncope 08/30/2017  . S/P placement of cardiac pacemaker 08/30/2017  . Sinus node dysfunction ([place]) 08/29/2017  . Myelofibrosis ([place]) 07/24/2017  . JAK2 V617F mutation 07/24/2017  . Essential hypertension 07/15/2017  . Gait disturbance 07/15/2017  . Uncontrolled type 2 diabetes mellitus without complication, with long-term current use of insulin ([place]) 07/15/2017  . Leukopenia 07/03/2017  . Anemia 07/03/2017  . Thrombocytosis ([place]) 07/03/2017    Past Surgical History:  Procedure Laterality Date  . BONE MARROW BIOPSY  2017  . LOOP RECORDER REMOVAL N/A 08/29/2017   Procedure: LOOP RECORDER REMOVAL;  Surgeon: Deboraha Sprang, MD;   Location: [facility];  Service: Cardiovascular;  Laterality: N/A;  . PACEMAKER IMPLANT Left 08/29/2017   St Jude generator  . PACEMAKER IMPLANT N/A 08/29/2017   Procedure: Pacemaker Implant;  Surgeon: Deboraha Sprang, MD;  Location: [facility];  Service: Cardiovascular;  Laterality: N/A;  . PORTACATH PLACEMENT  2017       Home Medications    Prior to Admission medications   Medication Sig Start Date End Date Taking? Authorizing Provider  ACCU-CHEK FASTCLIX LANCETS MISC Use as directed 3 times daily. E11.9 11/20/17  Yes Tresa Garter, MD  acetaminophen (TYLENOL) 325 MG tablet Take 2 tablets (650 mg total) by mouth every 4 (four) hours as needed for mild pain. 08/30/17  Yes Isaiah Serge, NP  amLODipine (NORVASC) 2.5 MG tablet Take 1 tablet (2.5 mg total) by mouth daily. 10/15/17  Yes Tresa Garter, MD  aspirin EC 81 MG tablet Take 81 mg by mouth daily.   Yes [provider]  Blood Glucose Monitoring Suppl (ACCU-CHEK AVIVA PLUS) w/Device KIT Use as directed 3 times daily. E11.9 11/20/17  Yes Jegede, Olugbemiga E, MD  glucose blood (ACCU-CHEK AVIVA PLUS) test strip Use as directed 3 times daily. E11.9 11/20/17  Yes Jegede, Marlena Clipper, MD  Insulin Glargine (LANTUS SOLOSTAR) 100 UNIT/ML Solostar Pen Inject 50 Units into the skin every morning. 10/15/17  Yes Tresa Garter, MD  isosorbide mononitrate (IMDUR) 30 MG 24 hr tablet Take 1 tablet (30 mg total) by mouth daily. 10/15/17  Yes Jegede, Olugbemiga E, MD  NITROSTAT 0.4 MG SL tablet Place 1 tablet (0.4 mg total) under the tongue every 5 (five) minutes as needed for chest pain. 09/16/17 09/16/18 Yes Belva Crome, MD  ruxolitinib phosphate (JAKAFI) 10 MG tablet Take 1 tablet (10 mg total) by mouth 2 (two) times daily. 11/20/17  Yes Perlov, Marinell Blight, MD  lidocaine (XYLOCAINE) 2 % solution Apply small amount to painful gums with a qtip as needed.  Avoid swallowing Patient not taking: Reported on  12/05/2017 08/13/17   Argentina Donovan, PA-C    Family History Family History  Problem Relation Age of Onset  . Hypertension Mother     Social History Social History   Tobacco Use  . Smoking status: Never Smoker  . Smokeless tobacco: Never Used  Substance Use Topics  . Alcohol use: No  . Drug use: No     Allergies   Patient has no known allergies.   Review of Systems Review of Systems  All other systems reviewed and are negative.    Physical Exam Updated Vital Signs BP (!) 111/59   Pulse 73   Temp (!) 97.4 F (36.3 C) (Oral)   Resp (!) 26   Ht _0  (1.651 m)   Wt 66.7 kg (147 lb)   SpO2 95%   BMI 24.46 kg/m   Physical Exam  Constitutional: He is oriented to person, place, and time. He appears well-developed and well-nourished.  HENT:  Head: Normocephalic and atraumatic.  Eyes: EOM are normal.  Neck: Normal range of motion.  Cardiovascular: Normal rate, regular rhythm, normal heart sounds and intact distal pulses.  Pulmonary/Chest: Effort normal and breath sounds normal. No respiratory distress.  Abdominal: Soft. He exhibits no distension. There is no tenderness.  Musculoskeletal: Normal range of motion.  Neurological: He is alert and oriented to person, place, and time.  Skin: Skin is warm and dry.  Psychiatric: He has a normal mood and affect. Judgment normal.  Nursing note and vitals reviewed.    ED Treatments / Results  Labs (all labs ordered are listed, but only abnormal results are displayed) Labs Reviewed  BASIC METABOLIC PANEL - Abnormal; Notable for the following components:      Result Value   Sodium 131 (*)    Chloride 99 (*)    Glucose, Bld 366 (*)    BUN 31 (*)    Creatinine, Ser 1.53 (*)    GFR calc non Af Amer 40 (*)    GFR calc Af Amer 46 (*)    All other components within normal limits  CBC - Abnormal; Notable for the following components:   WBC 12.3 (*)    Hemoglobin 10.6 (*)    HCT 33.6 (*)    MCV 73.5 (*)    MCH 23.2  (*)    RDW 25.3 (*)    All other components within normal limits  URINALYSIS, ROUTINE W REFLEX MICROSCOPIC - Abnormal; Notable for the following components:   Color, Urine AMBER (*)    APPearance CLOUDY (*)    Glucose, UA >=500 (*)    Squamous Epithelial / LPF 6-30 (*)    All other components within normal limits  CBG MONITORING, ED - Abnormal; Notable for the following components:   Glucose-Capillary 347 (*)    All other components within normal limits    EKG  EKG Interpretation  Date/Time:  Friday December 05 2017 18:45:44 EST Ventricular Rate:  80 PR Interval:    QRS Duration:  100 QT Interval:  372 QTC Calculation: 430 R Axis:   79 Text Interpretation:  Sinus rhythm Consider left atrial enlargement Left ventricular hypertrophy Nonspecific T abnormalities, lateral leads No significant change was found Confirmed by Jola Schmidt [phone]) on 12/05/2017 11:41:30 PM      BUN  Date Value Ref Range Status  12/05/2017 31 (H) 6 - 20 mg/dL Final  10/31/2017 24.4 7.0 - 26.0 mg/dL Final  09/16/2017 20 6 - 20 mg/dL Final  08/29/2017 22 (H) 6 - 20 mg/dL Final  07/10/2017 15 6 - 20 mg/dL Final  07/03/2017 23.3 7.0 - 26.0 mg/dL Final  06/11/2017 17 8 - 27 mg/dL Final   Creatinine  Date Value Ref Range Status  10/31/2017 1.3 0.7 - 1.3 mg/dL Final  07/03/2017 1.6 (H) 0.7 - 1.3 mg/dL Final   Creatinine, Ser  Date Value Ref Range Status  12/05/2017 1.53 (H) 0.61 - 1.24 mg/dL Final  09/16/2017 1.31 (H) 0.61 - 1.24 mg/dL Final  08/29/2017 1.40 (H) 0.61 - 1.24 mg/dL Final  07/10/2017 1.19 0.61 - 1.24 mg/dL Final      Radiology Dg Chest 2 View  Result Date: 12/06/2017 CLINICAL DATA:  Chronic intermittent generalized weakness and shortness of breath. EXAM: CHEST  2 VIEW COMPARISON:  Chest radiograph performed 09/16/2017, and CTA of the chest performed 11/26/2017 FINDINGS: The lungs are well-aerated and clear. There is no evidence of focal opacification, pleural effusion or  pneumothorax. The heart is normal in size; a pacemaker is noted at the left chest wall, with leads ending at the right atrium and right ventricle. No acute osseous abnormalities are seen. IMPRESSION: No acute cardiopulmonary process seen. Electronically Signed   By: Garald Balding M.D.   On: 12/06/2017 00:31   Ct Head Wo Contrast  Result Date: 12/06/2017 CLINICAL DATA:  Chronic intermittent weakness for 3 months. EXAM: CT HEAD WITHOUT CONTRAST TECHNIQUE: Contiguous axial images were obtained from the base of the skull through the vertex without intravenous contrast. COMPARISON:  CT of the head performed 07/10/2017 FINDINGS: Brain: No evidence of acute infarction, hemorrhage, hydrocephalus, extra-axial collection or mass lesion/mass effect. A chronic infarct is again noted at the right frontal lobe, with associated encephalomalacia. There is mild involvement of the right basal ganglia. The posterior fossa, including the cerebellum, brainstem and fourth ventricle, is within normal limits. The third and lateral ventricles are unremarkable in appearance. No mass effect or midline shift is seen. Vascular: No hyperdense vessel or unexpected calcification. Skull: No significant soft tissue abnormalities are seen. Sinuses/Orbits: The orbits are within normal limits. The paranasal sinuses and mastoid air cells are well-aerated. Other: No significant soft tissue abnormalities are seen. IMPRESSION: 1. No acute intracranial pathology seen on CT. 2. Chronic infarct again noted at the right frontal lobe, with associated encephalomalacia. Electronically Signed   By: Garald Balding M.D.   On: 12/06/2017 04:34    Procedures Procedures (including critical care time)  Medications Ordered in ED Medications - No data to display   Initial Impression / Assessment and Plan / ED Course  I have reviewed the triage vital signs and the nursing notes.  Pertinent labs & imaging results that were available during my care of the  patient were reviewed by me and considered in my medical decision making (see chart for details).    Workup in the emergency department is without significant abnormalities.  Patient is ambulatory in the ER.  Plan for discharge home.  Patient will need additional home health resources as it  sounds like he is having more difficulty with ADLs at home.  He lives at home with his son.  Close primary care follow-up.  Patient and family understand to return the emergency department for new or worsening symptoms.  Face-to-face examination will be performed for needed home health resources including home health social work.  Final Clinical Impressions(s) / ED Diagnoses   Final diagnoses:  None    ED Discharge Orders    None       Jola Schmidt, MD 12/06/17 [id number]    Jola Schmidt, MD 12/06/17 [phone]

## 2017-12-06 NOTE — Discharge Instructions (Signed)
A member of the case management team will be reaching out to you to help set up additional home health resources to better serve your needs at home

## 2017-12-07 NOTE — Care Management Note (Signed)
Case Management Note  Patient Details  Name: Merle Cirelli MRN: [id number] Date of Birth: 05-Oct-1930  Subjective/Objective:     General weakness               Action/Plan: Discharge Planning: NCM spoke to pt's son, [name] and offered choice for [facility]. Son states pt has used [facility] in the past. He has RW, wheelchair, cane, and bedside commode. Contacted [facility] with new referral. Waiting for confirmation.   PCP Tresa Garter MD  Expected Discharge Date:  12/05/2017               Expected Discharge Plan:  [place]  In-House Referral:  NA  Discharge planning Services  CM Consult  Post Acute Care Choice:  Home Health Choice offered to:  Adult Children  DME Arranged:  N/A DME Agency:  NA  HH Arranged:  PT, RN, OT, Nurse's Aide, Social Work [employer] Agency:  [facility] [employer]  Status of Service:  Completed, signed off  If discussed at [employer], dates discussed:    Additional Comments:  Erenest Rasher, RN 12/07/2017, 9:03 AM

## 2017-12-10 ENCOUNTER — Ambulatory Visit: Payer: Medicare Other | Attending: Internal Medicine | Admitting: Internal Medicine

## 2017-12-10 ENCOUNTER — Ambulatory Visit (INDEPENDENT_AMBULATORY_CARE_PROVIDER_SITE_OTHER): Payer: Medicare Other | Admitting: Internal Medicine

## 2017-12-10 ENCOUNTER — Encounter: Payer: Self-pay | Admitting: Internal Medicine

## 2017-12-10 VITALS — BP 150/68 | HR 80 | Temp 98.2°F | Resp 18 | Ht 67.0 in | Wt 143.0 lb

## 2017-12-10 VITALS — BP 166/80 | HR 66 | Ht 67.0 in | Wt 145.0 lb

## 2017-12-10 DIAGNOSIS — Z79899 Other long term (current) drug therapy: Secondary | ICD-10-CM | POA: Insufficient documentation

## 2017-12-10 DIAGNOSIS — I10 Essential (primary) hypertension: Secondary | ICD-10-CM | POA: Diagnosis not present

## 2017-12-10 DIAGNOSIS — E785 Hyperlipidemia, unspecified: Secondary | ICD-10-CM | POA: Diagnosis not present

## 2017-12-10 DIAGNOSIS — Z794 Long term (current) use of insulin: Secondary | ICD-10-CM | POA: Diagnosis not present

## 2017-12-10 DIAGNOSIS — IMO0001 Reserved for inherently not codable concepts without codable children: Secondary | ICD-10-CM

## 2017-12-10 DIAGNOSIS — R739 Hyperglycemia, unspecified: Secondary | ICD-10-CM | POA: Diagnosis not present

## 2017-12-10 DIAGNOSIS — Z9889 Other specified postprocedural states: Secondary | ICD-10-CM | POA: Insufficient documentation

## 2017-12-10 DIAGNOSIS — Z950 Presence of cardiac pacemaker: Secondary | ICD-10-CM | POA: Diagnosis not present

## 2017-12-10 DIAGNOSIS — R3911 Hesitancy of micturition: Secondary | ICD-10-CM

## 2017-12-10 DIAGNOSIS — N400 Enlarged prostate without lower urinary tract symptoms: Secondary | ICD-10-CM | POA: Diagnosis not present

## 2017-12-10 DIAGNOSIS — C9591 Leukemia, unspecified, in remission: Secondary | ICD-10-CM | POA: Diagnosis not present

## 2017-12-10 DIAGNOSIS — I495 Sick sinus syndrome: Secondary | ICD-10-CM | POA: Diagnosis not present

## 2017-12-10 DIAGNOSIS — R42 Dizziness and giddiness: Secondary | ICD-10-CM | POA: Insufficient documentation

## 2017-12-10 DIAGNOSIS — E1165 Type 2 diabetes mellitus with hyperglycemia: Secondary | ICD-10-CM

## 2017-12-10 DIAGNOSIS — R55 Syncope and collapse: Secondary | ICD-10-CM | POA: Diagnosis not present

## 2017-12-10 DIAGNOSIS — Z7982 Long term (current) use of aspirin: Secondary | ICD-10-CM | POA: Insufficient documentation

## 2017-12-10 DIAGNOSIS — N401 Enlarged prostate with lower urinary tract symptoms: Secondary | ICD-10-CM | POA: Diagnosis not present

## 2017-12-10 DIAGNOSIS — R300 Dysuria: Secondary | ICD-10-CM | POA: Insufficient documentation

## 2017-12-10 DIAGNOSIS — D469 Myelodysplastic syndrome, unspecified: Secondary | ICD-10-CM | POA: Insufficient documentation

## 2017-12-10 LAB — POCT URINALYSIS DIPSTICK
BILIRUBIN UA: NEGATIVE
Blood, UA: NEGATIVE
Glucose, UA: 500
KETONES UA: NEGATIVE
Leukocytes, UA: NEGATIVE
Nitrite, UA: NEGATIVE
PH UA: 5.5 (ref 5.0–8.0)
Protein, UA: 30
Spec Grav, UA: 1.015 (ref 1.010–1.025)
UROBILINOGEN UA: 0.2 U/dL

## 2017-12-10 LAB — CUP PACEART INCLINIC DEVICE CHECK
Battery Remaining Longevity: 145 mo
Battery Voltage: 3.02 V
Brady Statistic RA Percent Paced: 6.5 %
Date Time Interrogation Session: 20181219173151
Implantable Lead Implant Date: 20180907
Implantable Lead Location: 753860
Implantable Lead Model: 1944
Implantable Lead Model: 1948
Lead Channel Pacing Threshold Amplitude: 0.75 V
Lead Channel Pacing Threshold Amplitude: 0.75 V
Lead Channel Pacing Threshold Pulse Width: 0.4 ms
Lead Channel Pacing Threshold Pulse Width: 0.4 ms
Lead Channel Sensing Intrinsic Amplitude: 1.7 mV
MDC IDC LEAD IMPLANT DT: 20180907
MDC IDC LEAD LOCATION: 753859
MDC IDC MSMT LEADCHNL RA IMPEDANCE VALUE: 487.5 Ohm
MDC IDC MSMT LEADCHNL RA PACING THRESHOLD PULSEWIDTH: 0.4 ms
MDC IDC MSMT LEADCHNL RV IMPEDANCE VALUE: 737.5 Ohm
MDC IDC MSMT LEADCHNL RV PACING THRESHOLD AMPLITUDE: 0.75 V
MDC IDC MSMT LEADCHNL RV PACING THRESHOLD AMPLITUDE: 0.75 V
MDC IDC MSMT LEADCHNL RV PACING THRESHOLD PULSEWIDTH: 0.4 ms
MDC IDC MSMT LEADCHNL RV SENSING INTR AMPL: 12 mV
MDC IDC PG IMPLANT DT: 20180907
MDC IDC PG SERIAL: 8939912
MDC IDC SET LEADCHNL RA PACING AMPLITUDE: 2 V
MDC IDC SET LEADCHNL RV PACING AMPLITUDE: 2.5 V
MDC IDC SET LEADCHNL RV PACING PULSEWIDTH: 0.4 ms
MDC IDC SET LEADCHNL RV SENSING SENSITIVITY: 2 mV
MDC IDC STAT BRADY RV PERCENT PACED: 0 %
Pulse Gen Model: 2272

## 2017-12-10 LAB — GLUCOSE, POCT (MANUAL RESULT ENTRY): POC Glucose: 434 mg/dl — AB (ref 70–99)

## 2017-12-10 MED ORDER — AMLODIPINE BESYLATE 2.5 MG PO TABS: 2.5000 mg | ORAL_TABLET | Freq: Every day | ORAL | 3 refills | Status: DC

## 2017-12-10 MED ORDER — ISOSORBIDE MONONITRATE ER 30 MG PO TB24: 30.0000 mg | ORAL_TABLET | Freq: Every day | ORAL | 3 refills | Status: DC

## 2017-12-10 MED ORDER — INSULIN GLARGINE 100 UNIT/ML SOLOSTAR PEN: 50.0000 [IU] | PEN_INJECTOR | SUBCUTANEOUS | 3 refills | Status: DC

## 2017-12-10 MED ORDER — AMLODIPINE BESYLATE 10 MG PO TABS: 10.0000 mg | ORAL_TABLET | Freq: Every day | ORAL | 3 refills | Status: DC

## 2017-12-10 MED ORDER — AMLODIPINE BESYLATE 10 MG PO TABS: 10.0000 mg | ORAL_TABLET | Freq: Every day | ORAL | 0 refills | Status: DC

## 2017-12-10 MED ORDER — INSULIN REGULAR HUMAN 100 UNIT/ML IJ SOLN: 10.0000 [IU] | Freq: Three times a day (TID) | INTRAMUSCULAR | 11 refills | Status: AC

## 2017-12-10 MED ORDER — TAMSULOSIN HCL 0.4 MG PO CAPS: 0.4000 mg | ORAL_CAPSULE | Freq: Every day | ORAL | 3 refills | Status: DC

## 2017-12-10 NOTE — Progress Notes (Signed)
Aaron Nelson, is a 83 y.o. male  MOQ:[id number]  PTW:[id number]  DOB - [birth year]  Chief Complaint  Patient presents with  . Dysuria       Subjective:   Aaron Nelson is a 83 y.o. male with history of DM, HTN, myelodysplastic syndrome, syncope 2/2 sinus arrest s/p PPM implant 08/29/17 (and removal of loop), hs hx of dizziness with position changes and micturation as well who presents here today for a follow up visit and medication management. Patient was recently started on Ruxolutinib by the Oncologist. He still feels weak but better than the time he went to ED. According to his son, BS ranges between 160 and 350. No hypoglycemic episodes. Major complaint today is difficulty urinating, hesitancy and terminal dribbling. He has Hx of BPH, no surgery. He denies dysuria, the most frustrating symptom is hesitancy, he seats in the toilet for so long before he can pass urine. No fever, no suprapubic pain. Son helps with ADL but can use some help. He has contacted the insurance company and a home health aid is being arranged, he is awaiting a call today. Patient claims he is eating much better, sleeps well. Denies any fall at home. No domestic concern. Got his wheelchair, found it very helpful. Walks with cane at home. Patient has No headache, No chest pain, No abdominal pain - No Nausea, No new weakness tingling or numbness, No Cough - SOB.  Problem  Leukemia in Remission (Hcc)  Benign Prostatic Hyperplasia With Hesitancy  Hyperglycemia    ALLERGIES: No Known Allergies  PAST MEDICAL HISTORY: Past Medical History:  Diagnosis Date  . Diabetes mellitus without complication ([place])   . Hypertension   . Myelodysplasia (myelodysplastic syndrome) ([place])   . S/P placement of cardiac pacemaker 08/30/2017  . Sinus arrest   . Syncope   . Syncope and collapse 08/30/2017    MEDICATIONS AT HOME: Prior to Admission medications   Medication Sig Start Date End Date Taking? Authorizing Provider  ACCU-CHEK  FASTCLIX LANCETS MISC Use as directed 3 times daily. E11.9 11/20/17  Yes Tresa Garter, MD  acetaminophen (TYLENOL) 325 MG tablet Take 2 tablets (650 mg total) by mouth every 4 (four) hours as needed for mild pain. 08/30/17  Yes Isaiah Serge, NP  amLODipine (NORVASC) 2.5 MG tablet Take 1 tablet (2.5 mg total) by mouth daily. 12/10/17  Yes Tresa Garter, MD  aspirin EC 81 MG tablet Take 81 mg by mouth daily.   Yes [provider]  Blood Glucose Monitoring Suppl (ACCU-CHEK AVIVA PLUS) w/Device KIT Use as directed 3 times daily. E11.9 11/20/17  Yes Kylah Maresh E, MD  glucose blood (ACCU-CHEK AVIVA PLUS) test strip Use as directed 3 times daily. E11.9 11/20/17  Yes Trudee Chirino, Marlena Clipper, MD  Insulin Glargine (LANTUS SOLOSTAR) 100 UNIT/ML Solostar Pen Inject 50 Units into the skin every morning. 12/10/17  Yes Tresa Garter, MD  isosorbide mononitrate (IMDUR) 30 MG 24 hr tablet Take 1 tablet (30 mg total) by mouth daily. 12/10/17  Yes Kacee Koren E, MD  lidocaine (XYLOCAINE) 2 % solution Apply small amount to painful gums with a qtip as needed.  Avoid swallowing 08/13/17  Yes McClung, [name] M, PA-C  NITROSTAT 0.4 MG SL tablet Place 1 tablet (0.4 mg total) under the tongue every 5 (five) minutes as needed for chest pain. 09/16/17 09/16/18 Yes Belva Crome, MD  ruxolitinib phosphate (JAKAFI) 10 MG tablet Take 1 tablet (10 mg total) by mouth 2 (two) times  daily. 11/20/17  Yes Perlov, Marinell Blight, MD  insulin regular (HUMULIN R) 100 units/mL injection Inject 0.1 mLs (10 Units total) into the skin 3 (three) times daily before meals. For Blood Sugar greater than 250 12/10/17   Sandie Swayze E, MD  tamsulosin (FLOMAX) 0.4 MG CAPS capsule Take 1 capsule (0.4 mg total) by mouth daily. 12/10/17   Tresa Garter, MD    Objective:   Vitals:   12/10/17 0918  BP: (!) 150/68  Pulse: 80  Resp: 18  Temp: 98.2 F (36.8 C)  TempSrc: Oral  SpO2: 99%  Weight: 143  lb (64.9 kg)  Height: '5\' 7"'  (1.702 m)   Exam General appearance : Awake, alert, not in any distress. Speech Clear. Not toxic looking, chronically ill-looking, temporal wasting HEENT: Atraumatic and Normocephalic, pupils equally reactive to light and accomodation Neck: Supple, no JVD. No cervical lymphadenopathy.  Chest: Good air entry bilaterally, no added sounds  CVS: S1 S2 regular, no murmurs.  Abdomen: Bowel sounds present, Non tender and not distended with no gaurding, rigidity or rebound. Extremities: B/L Lower Ext shows no edema, both legs are warm to touch Neurology: Awake alert, and oriented X 3, CN II-XII intact, Non focal Skin: No Rash  Data Review Lab Results  Component Value Date   HGBA1C 13.5 10/15/2017   HGBA1C 14.6 07/15/2017    Assessment & Plan   1. Uncontrolled type 2 diabetes mellitus without complication, with long-term current use of insulin (HCC)  - Urinalysis Dipstick - Glucose (CBG) Continue - Insulin Glargine (LANTUS SOLOSTAR) 100 UNIT/ML Solostar Pen; Inject 50 Units into the skin every morning.  Dispense: 15 mL; Refill: 3  2. Essential hypertension  - amLODipine (NORVASC) 2.5 MG tablet; Take 1 tablet (2.5 mg total) by mouth daily.  Dispense: 90 tablet; Refill: 3 - isosorbide mononitrate (IMDUR) 30 MG 24 hr tablet; Take 1 tablet (30 mg total) by mouth daily.  Dispense: 90 tablet; Refill: 3  3. Leukemia in remission, unspecified leukemia type ([place]) - Continue Ruxolitnib Phosphate - Continue follow up with Oncologist  4. Benign prostatic hyperplasia with hesitancy Start Flomax for symptom relief - tamsulosin (FLOMAX) 0.4 MG CAPS capsule; Take 1 capsule (0.4 mg total) by mouth daily.  Dispense: 30 capsule; Refill: 3  5. Hyperglycemia Add - insulin regular (HUMULIN R) 100 units/mL injection; Inject 0.1 mLs (10 Units total) into the skin 3 (three) times daily before meals. For Blood Sugar greater than 250  Dispense: 10 mL; Refill: 11  Patient have  been counseled extensively about nutrition and exercise. Other issues discussed during this visit include: low cholesterol diet, weight control and daily exercise, foot care, annual eye examinations at Ophthalmology, importance of adherence with medications and regular follow-up. We also discussed long term complications of uncontrolled diabetes and hypertension.   Return in about 3 months (around 03/10/2018) for Hemoglobin A1C and Follow up, DM, Follow up HTN.  The patient was given clear instructions to go to ER or return to medical center if symptoms don't improve, worsen or new problems develop. The patient verbalized understanding. The patient was told to call to get lab results if they haven't heard anything in the next week.   This note has been created with Surveyor, quantity. Any transcriptional errors are unintentional.    Angelica Chessman, MD, MHA, Karilyn Cota, [place] and [place], [place]   12/10/2017, 9:50 AM

## 2017-12-10 NOTE — Patient Instructions (Addendum)
Medication Instructions: Your physician has recommended you make the following change in your medication:  -1) INCREASE Amlodipine (Norvasc) 10 mg - Take 1 tablet (10 mg) by mouth daily  Labwork: None Ordered  Procedures/Testing: None Ordered  Follow-Up: Your physician wants you to follow-up in: 1 YEAR with Dr. Caryl Comes. You will receive a reminder letter in the mail two months in advance. If you don't receive a letter, please call our office to schedule the follow-up appointment.  Remote monitoring is used to monitor your Pacemaker from home. This monitoring reduces the number of office visits required to check your device to one time per year. It allows [place] to keep an eye on the functioning of your device to ensure it is working properly. You are scheduled for a device check from home on 03/11/18. You may send your transmission at any time that day. If you have a wireless device, the transmission will be sent automatically. After your physician reviews your transmission, you will receive a postcard with your next transmission date.   If you need a refill on your cardiac medications before your next appointment, please call your pharmacy.   -- Please obtain and abdominal binder-- -- Please follow up with your Primary Care Physician as soon as possible about your blood pressure --

## 2017-12-10 NOTE — Patient Instructions (Signed)
Diabetes Mellitus and Nutrition When you have diabetes (diabetes mellitus), it is very important to have healthy eating habits because your blood sugar (glucose) levels are greatly affected by what you eat and drink. Eating healthy foods in the appropriate amounts, at about the same times every day, can help you:  Control your blood glucose.  Lower your risk of heart disease.  Improve your blood pressure.  Reach or maintain a healthy weight.  Every person with diabetes is different, and each person has different needs for a meal plan. Your health care provider may recommend that you work with a diet and nutrition specialist (dietitian) to make a meal plan that is best for you. Your meal plan may vary depending on factors such as:  The calories you need.  The medicines you take.  Your weight.  Your blood glucose, blood pressure, and cholesterol levels.  Your activity level.  Other health conditions you have, such as heart or kidney disease.  How do carbohydrates affect me? Carbohydrates affect your blood glucose level more than any other type of food. Eating carbohydrates naturally increases the amount of glucose in your blood. Carbohydrate counting is a method for keeping track of how many carbohydrates you eat. Counting carbohydrates is important to keep your blood glucose at a healthy level, especially if you use insulin or take certain oral diabetes medicines. It is important to know how many carbohydrates you can safely have in each meal. This is different for every person. Your dietitian can help you calculate how many carbohydrates you should have at each meal and for snack. Foods that contain carbohydrates include:  Bread, cereal, rice, pasta, and crackers.  Potatoes and corn.  Peas, beans, and lentils.  Milk and yogurt.  Fruit and juice.  Desserts, such as cakes, cookies, ice cream, and candy.  How does alcohol affect me? Alcohol can cause a sudden decrease in blood  glucose (hypoglycemia), especially if you use insulin or take certain oral diabetes medicines. Hypoglycemia can be a life-threatening condition. Symptoms of hypoglycemia (sleepiness, dizziness, and confusion) are similar to symptoms of having too much alcohol. If your health care provider says that alcohol is safe for you, follow these guidelines:  Limit alcohol intake to no more than 1 drink per day for nonpregnant women and 2 drinks per day for men. One drink equals 12 oz of beer, 5 oz of wine, or 1 oz of hard liquor.  Do not drink on an empty stomach.  Keep yourself hydrated with water, diet soda, or unsweetened iced tea.  Keep in mind that regular soda, juice, and other mixers may contain a lot of sugar and must be counted as carbohydrates.  What are tips for following this plan? Reading food labels  Start by checking the serving size on the label. The amount of calories, carbohydrates, fats, and other nutrients listed on the label are based on one serving of the food. Many foods contain more than one serving per package.  Check the total grams (g) of carbohydrates in one serving. You can calculate the number of servings of carbohydrates in one serving by dividing the total carbohydrates by 15. For example, if a food has 30 g of total carbohydrates, it would be equal to 2 servings of carbohydrates.  Check the number of grams (g) of saturated and trans fats in one serving. Choose foods that have low or no amount of these fats.  Check the number of milligrams (mg) of sodium in one serving. Most people   should limit total sodium intake to less than 2,300 mg per day.  Always check the nutrition information of foods labeled as "low-fat" or "nonfat". These foods may be higher in added sugar or refined carbohydrates and should be avoided.  Talk to your dietitian to identify your daily goals for nutrients listed on the label. Shopping  Avoid buying canned, premade, or processed foods. These  foods tend to be high in fat, sodium, and added sugar.  Shop around the outside edge of the grocery store. This includes fresh fruits and vegetables, bulk grains, fresh meats, and fresh dairy. Cooking  Use low-heat cooking methods, such as baking, instead of high-heat cooking methods like deep frying.  Cook using healthy oils, such as olive, canola, or sunflower oil.  Avoid cooking with butter, cream, or high-fat meats. Meal planning  Eat meals and snacks regularly, preferably at the same times every day. Avoid going long periods of time without eating.  Eat foods high in fiber, such as fresh fruits, vegetables, beans, and whole grains. Talk to your dietitian about how many servings of carbohydrates you can eat at each meal.  Eat 4-6 ounces of lean protein each day, such as lean meat, chicken, fish, eggs, or tofu. 1 ounce is equal to 1 ounce of meat, chicken, or fish, 1 egg, or 1/4 cup of tofu.  Eat some foods each day that contain healthy fats, such as avocado, nuts, seeds, and fish. Lifestyle   Check your blood glucose regularly.  Exercise at least 30 minutes 5 or more days each week, or as told by your health care provider.  Take medicines as told by your health care provider.  Do not use any products that contain nicotine or tobacco, such as cigarettes and e-cigarettes. If you need help quitting, ask your health care provider.  Work with a Social worker or diabetes educator to identify strategies to manage stress and any emotional and social challenges. What are some questions to ask my health care provider?  Do I need to meet with a diabetes educator?  Do I need to meet with a dietitian?  What number can I call if I have questions?  When are the best times to check my blood glucose? Where to find more information:  American Diabetes Association: diabetes.org/food-and-fitness/food  Academy of Nutrition and Dietetics:  PokerClues.dk  [employer] of Diabetes and Digestive and Kidney Diseases (NIH): ContactWire.be Summary  A healthy meal plan will help you control your blood glucose and maintain a healthy lifestyle.  Working with a diet and nutrition specialist (dietitian) can help you make a meal plan that is best for you.  Keep in mind that carbohydrates and alcohol have immediate effects on your blood glucose levels. It is important to count carbohydrates and to use alcohol carefully. This information is not intended to replace advice given to you by your health care provider. Make sure you discuss any questions you have with your health care provider. Document Released: 09/05/2005 Document Revised: 01/13/2017 Document Reviewed: 01/13/2017 Elsevier Interactive Patient Education  2018 [employer]. Benign Prostatic Hyperplasia Benign prostatic hyperplasia (BPH) is an enlarged prostate gland that is caused by the normal aging process and not by cancer. The prostate is a walnut-sized gland that is involved in the production of semen. It is located in front of the rectum and below the bladder. The bladder stores urine and the urethra is the tube that carries the urine out of the body. The prostate may get bigger as a man gets  older. An enlarged prostate can press on the urethra. This can make it harder to pass urine. The build-up of urine in the bladder can cause infection. Back pressure and infection may progress to bladder damage and kidney (renal) failure. What are the causes? This condition is part of a normal aging process. However, not all men develop problems from this condition. If the prostate enlarges away from the urethra, urine flow will not be blocked. If it enlarges toward the urethra and compresses it, there will be problems passing urine. What increases the risk? This  condition is more likely to develop in men over the age of 74 years. What are the signs or symptoms? Symptoms of this condition include:  Getting up often during the night to urinate.  Needing to urinate frequently during the day.  Difficulty starting urine flow.  Decrease in size and strength of your urine stream.  Leaking (dribbling) after urinating.  Inability to pass urine. This needs immediate treatment.  Inability to completely empty your bladder.  Pain when you pass urine. This is more common if there is also an infection.  Urinary tract infection (UTI).  How is this diagnosed? This condition is diagnosed based on your medical history, a physical exam, and your symptoms. Tests will also be done, such as:  A post-void bladder scan. This measures any amount of urine that may remain in your bladder after you finish urinating.  A digital rectal exam. In a rectal exam, your health care provider checks your prostate by putting a lubricated, gloved finger into your rectum to feel the back of your prostate gland. This exam detects the size of your gland and any abnormal lumps or growths.  An exam of your urine (urinalysis).  A prostate specific antigen (PSA) screening. This is a blood test used to screen for prostate cancer.  An ultrasound. This test uses sound waves to electronically produce a picture of your prostate gland.  Your health care provider may refer you to a specialist in kidney and prostate diseases (urologist). How is this treated? Once symptoms begin, your health care provider will monitor your condition (active surveillance or watchful waiting). Treatment for this condition will depend on the severity of your condition. Treatment may include:  Observation and yearly exams. This may be the only treatment needed if your condition and symptoms are mild.  Medicines to relieve your symptoms, including: ? Medicines to shrink the prostate. ? Medicines to relax the  muscle of the prostate.  Surgery in severe cases. Surgery may include: ? Prostatectomy. In this procedure, the prostate tissue is removed completely through an open incision or with a laparascope or robotics. ? Transurethral resection of the prostate (TURP). In this procedure, a tool is inserted through the opening at the tip of the penis (urethra). It is used to cut away tissue of the inner core of the prostate. The pieces are removed through the same opening of the penis. This removes the blockage. ? Transurethral incision (TUIP). In this procedure, small cuts are made in the prostate. This lessens the prostate's pressure on the urethra. ? Transurethral microwave thermotherapy (TUMT). This procedure uses microwaves to create heat. The heat destroys and removes a small amount of prostate tissue. ? Transurethral needle ablation (TUNA). This procedure uses radio frequencies to destroy and remove a small amount of prostate tissue. ? Interstitial laser coagulation ([place]). This procedure uses a laser to destroy and remove a small amount of prostate tissue. ? Transurethral electrovaporization (TUVP). This procedure  uses electrodes to destroy and remove a small amount of prostate tissue. ? Prostatic urethral lift. This procedure inserts an implant to push the lobes of the prostate away from the urethra.  Follow these instructions at home:  Take over-the-counter and prescription medicines only as told by your health care provider.  Monitor your symptoms for any changes. Contact your health care provider with any changes.  Avoid drinking large amounts of liquid before going to bed or out in public.  Avoid or reduce how much caffeine or alcohol you drink.  Give yourself time when you urinate.  Keep all follow-up visits as told by your health care provider. This is important. Contact a health care provider if:  You have unexplained back pain.  Your symptoms do not get better with treatment.  You  develop side effects from the medicine you are taking.  Your urine becomes very dark or has a bad smell.  Your lower abdomen becomes distended and you have trouble passing your urine. Get help right away if:  You have a fever or chills.  You suddenly cannot urinate.  You feel lightheaded, or very dizzy, or you faint.  There are large amounts of blood or clots in the urine.  Your urinary problems become hard to manage.  You develop moderate to severe low back or flank pain. The flank is the side of your body between the ribs and the hip. These symptoms may represent a serious problem that is an emergency. Do not wait to see if the symptoms will go away. Get medical help right away. Call your local emergency services (911 in the [place] Do not drive yourself to the hospital. Summary  Benign prostatic hyperplasia (BPH) is an enlarged prostate that is caused by the normal aging process and not by cancer.  An enlarged prostate can press on the urethra. This can make it hard to pass urine.  This condition is part of a normal aging process and is more likely to develop in men over the age of 85 years.  Get help right away if you suddenly cannot urinate. This information is not intended to replace advice given to you by your health care provider. Make sure you discuss any questions you have with your health care provider. Document Released: 12/09/2005 Document Revised: 01/13/2017 Document Reviewed: 01/13/2017 Elsevier Interactive Patient Education  [employer].

## 2017-12-10 NOTE — Progress Notes (Signed)
Patient Care Team: Tresa Garter, MD as PCP - General (Internal Medicine)   HPI  Aaron Nelson is a 83 y.o. male With a chief complaint of pacemaker implanted for sinus arrest and syncope having been demonstrated on a loop recorder subsequently explanted.  He has issues of weakness following standing in urination.  Reviewing the notes from wellness center from this morning outlines issues with diabetes, urinary hesitancy and frequency; is also noted to have long-standing hypertension.  His son is also concerned about shortness of breath.  Echocardiogram 10/18 demonstrated normal LV function Myoview demonstrated no significant ischemia; these were undertaken because of complaints of dyspnea  The other major complaint is weakness and falling particularly after having been up for a few minutes.  He also has orthostatic intolerance  He has had post micturition syncope    Records and Results Reviewed   Past Medical History:  Diagnosis Date  . Diabetes mellitus without complication ([place])   . Hypertension   . Myelodysplasia (myelodysplastic syndrome) ([place])   . S/P placement of cardiac pacemaker 08/30/2017  . Sinus arrest   . Syncope   . Syncope and collapse 08/30/2017    Past Surgical History:  Procedure Laterality Date  . BONE MARROW BIOPSY  2017  . LOOP RECORDER REMOVAL N/A 08/29/2017   Procedure: LOOP RECORDER REMOVAL;  Surgeon: Deboraha Sprang, MD;  Location: [facility];  Service: Cardiovascular;  Laterality: N/A;  . PACEMAKER IMPLANT Left 08/29/2017   St Jude generator  . PACEMAKER IMPLANT N/A 08/29/2017   Procedure: Pacemaker Implant;  Surgeon: Deboraha Sprang, MD;  Location: [facility];  Service: Cardiovascular;  Laterality: N/A;  . PORTACATH PLACEMENT  2017    Current Outpatient Medications  Medication Sig Dispense Refill  . ACCU-CHEK FASTCLIX LANCETS MISC Use as directed 3 times daily. E11.9 100 each 12  . acetaminophen (TYLENOL) 325 MG tablet  Take 2 tablets (650 mg total) by mouth every 4 (four) hours as needed for mild pain.    Marland Kitchen amLODipine (NORVASC) 2.5 MG tablet Take 1 tablet (2.5 mg total) by mouth daily. 90 tablet 3  . aspirin EC 81 MG tablet Take 81 mg by mouth daily.    . Blood Glucose Monitoring Suppl (ACCU-CHEK AVIVA PLUS) w/Device KIT Use as directed 3 times daily. E11.9 1 kit 0  . glucose blood (ACCU-CHEK AVIVA PLUS) test strip Use as directed 3 times daily. E11.9 100 each 12  . Insulin Glargine (LANTUS SOLOSTAR) 100 UNIT/ML Solostar Pen Inject 50 Units into the skin every morning. 15 mL 3  . insulin regular (HUMULIN R) 100 units/mL injection Inject 0.1 mLs (10 Units total) into the skin 3 (three) times daily before meals. For Blood Sugar greater than 250 10 mL 11  . isosorbide mononitrate (IMDUR) 30 MG 24 hr tablet Take 1 tablet (30 mg total) by mouth daily. 90 tablet 3  . lidocaine (XYLOCAINE) 2 % solution Apply small amount to painful gums with a qtip as needed.  Avoid swallowing 100 mL 0  . NITROSTAT 0.4 MG SL tablet Place 1 tablet (0.4 mg total) under the tongue every 5 (five) minutes as needed for chest pain. 25 tablet 3  . ruxolitinib phosphate (JAKAFI) 10 MG tablet Take 1 tablet (10 mg total) by mouth 2 (two) times daily. 60 tablet 0  . tamsulosin (FLOMAX) 0.4 MG CAPS capsule Take 1 capsule (0.4 mg total) by mouth daily. 30 capsule 3   No current facility-administered  medications for this visit.     No Known Allergies    Review of Systems negative except from HPI and PMH  Physical Exam BP (!) 166/80   Pulse 66   Ht '5\' 7"'  (1.702 m)   Wt 145 lb (65.8 kg)   BMI 22.71 kg/m  Well developed and nourished in no acute distress sitting in wheel chair  HENT normal Neck supple with JVP-flat Carotids brisk and full without bruits Clear Regular rate and rhythm, 2/6 m Abd-soft with active BS without hepatomegaly No Clubbing cyanosis edema Skin-warm and dry A & Oriented  Grossly normal sensory and motor  function  ECG for rhythm Sinus @ 66 17/08/39 LVH w repol  Assessment and  Plan  Systolic hypertension  Orthostatic lightheadedness/syncope  Sinus arrest with pauses  Pacemaker St Jude   His orthostatic changes are PROFOUND and his systolic hypertension is very high.  This will be difficult to manage.  We discussed the need to increase antihypertensive therapy so have elected to change amlodipine  2.5>>10 its long t 1/2 willcause these effects to be somewhat gradual   I will reach out to his PCP for closer followup  Management of the falling BP is the greater challenge both because of the profound nature of the change and the consequences which are falls  Non pharmacological options reduce drug interactions--so will start with abdominal binder and isometric contraction prior to standing.  Pyridostigmine 30-60 bid would be next   No interval syncope   Current medicines are reviewed at length with the patient today .  The patient does not  have concerns regarding medicines.

## 2017-12-11 ENCOUNTER — Other Ambulatory Visit: Payer: Self-pay | Admitting: Internal Medicine

## 2017-12-11 DIAGNOSIS — I10 Essential (primary) hypertension: Secondary | ICD-10-CM

## 2017-12-21 DIAGNOSIS — Z950 Presence of cardiac pacemaker: Secondary | ICD-10-CM | POA: Diagnosis not present

## 2017-12-21 DIAGNOSIS — Z794 Long term (current) use of insulin: Secondary | ICD-10-CM | POA: Diagnosis not present

## 2017-12-21 DIAGNOSIS — D649 Anemia, unspecified: Secondary | ICD-10-CM | POA: Diagnosis not present

## 2017-12-21 DIAGNOSIS — I10 Essential (primary) hypertension: Secondary | ICD-10-CM | POA: Diagnosis not present

## 2017-12-21 DIAGNOSIS — D469 Myelodysplastic syndrome, unspecified: Secondary | ICD-10-CM | POA: Diagnosis not present

## 2017-12-21 DIAGNOSIS — E119 Type 2 diabetes mellitus without complications: Secondary | ICD-10-CM | POA: Diagnosis not present

## 2017-12-21 DIAGNOSIS — Z7982 Long term (current) use of aspirin: Secondary | ICD-10-CM | POA: Diagnosis not present

## 2017-12-22 NOTE — Assessment & Plan Note (Signed)
83 year old male with diagnosis of primary myelofibrosis with associated neutropenia without recurrent infections, anemia not requiring transfusions and thrombocytosis. Patient has been having hematological abnormalities from extended period of time, the earliest noted 2005. Patient underwent at least 2 bone marrow biopsy evaluations, most recent on 01/09/17 documenting the recurrent condition. IPSS score 3 -- high risk, mainly due to advanced age of the patient, anemia, and some systemic symptoms attribution of which is a little bit challenging. Patient's somnolence is not clearly related to myelofibrosis. He does not appear to have any problems with splenomegaly based on physical exam or symptoms and his appetite is excellent.   Patient continues to experience somnolence.  His hematological profile demonstrates progressive anemia.  This likely the anemia is due to progressive deterioration of the bone marrow synthetic function, although other etiologies are possible as well.  Management of progressive anemia discussed with the patient including possible initiation of ruxolitinib versus supportive care with transfusions.  The previous visit, patient was apprehensive regarding possible initiation of therapy. He has since reviewed the options with his family and now appears to be interested in starting ruxolitinib.  Purpose of the treatment of symptom reduction and improvement in performance status. Treatment is palliative in nature.  Plan: --Start ruxolitinib 27m PO BID in the second half of December -- return to clinic for toxicity monitoring first week of January with lab work and possible blood transfusion.

## 2017-12-22 NOTE — Progress Notes (Signed)
[name] Follow-up Visit:  Assessment: Myelofibrosis [name]) 83 year old male with diagnosis of primary myelofibrosis with associated neutropenia without recurrent infections, anemia not requiring transfusions and thrombocytosis. Patient has been having hematological abnormalities from extended period of time, the earliest noted 2005. Patient underwent at least 2 bone marrow biopsy evaluations, most recent on 01/09/17 documenting the recurrent condition. IPSS score 3 -- high risk, mainly due to advanced age of the patient, anemia, and some systemic symptoms attribution of which is a little bit challenging. Patient's somnolence is not clearly related to myelofibrosis. He does not appear to have any problems with splenomegaly based on physical exam or symptoms and his appetite is excellent.   Patient continues to experience somnolence.  His hematological profile demonstrates progressive anemia.  This likely the anemia is due to progressive deterioration of the bone marrow synthetic function, although other etiologies are possible as well.  Management of progressive anemia discussed with the patient including possible initiation of ruxolitinib versus supportive care with transfusions.  The previous visit, patient was apprehensive regarding possible initiation of therapy. He has since reviewed the options with his family and now appears to be interested in starting ruxolitinib.  Purpose of the treatment of symptom reduction and improvement in performance status. Treatment is palliative in nature.  Plan: --Start ruxolitinib 2m PO BID in the second half of December -- return to clinic for toxicity monitoring first week of January with lab work and possible blood transfusion.  Voice recognition software was used and creation of this note. Despite my best effort at editing the text, some misspelling/errors may have occurred.   Orders Placed This Encounter  Procedures  . CT ANGIO  CHEST PE W OR WO CONTRAST    Standing Status:   Future    Number of Occurrences:   1    Standing Expiration Date:   02/19/2019    Order Specific Question:   If indicated for the ordered procedure, I authorize the administration of contrast media per Radiology protocol    Answer:   Yes    Order Specific Question:   [facility]    [facility]   Order Specific Question:   Radiology Contrast Protocol - do NOT remove file path    Answer:   [email or web address]    Order Specific Question:   Reason for Exam additional comments    Answer:   Progressive SOB, please eval for PE  . CBC with Differential    Standing Status:   Future    Number of Occurrences:   1    Standing Expiration Date:   11/19/2018  . CBC with Differential    Standing Status:   Future    Standing Expiration Date:   11/19/2018  . Comprehensive metabolic panel    Standing Status:   Future    Standing Expiration Date:   11/19/2018  . Lactate dehydrogenase (LDH)    Standing Status:   Future    Standing Expiration Date:   11/19/2018  . Uric acid    Standing Status:   Future    Standing Expiration Date:   11/19/2018  . Magnesium    Standing Status:   Future    Standing Expiration Date:   11/19/2018  . Hold Tube, Blood Bank    Standing Status:   Future    Number of Occurrences:   1    Standing Expiration Date:   11/19/2018    Cancer Staging No matching staging  information was found for the patient.  All questions were answered.  . The patient knows to call the clinic with any problems, questions or concerns.  This note was electronically signed.    History of Presenting Illness Aaron Nelson 83 y.o. followed in the [place] for diagnosis of myelofibrosis. Patient returns to the clinic to discuss on review of the previous records. No new complaints since last visit to the clinic. It appears that he no longer experiences headaches, lightheadedness, or  dizziness as his blood pressure control has improved.  Review of the previous records demonstrates a bone marrow biopsy results consistent with primary myelofibrosis with positive testing for Aaron Nelson 2 mutation. Initially, patient was treated with lenalidomide, but experienced a syncopal event medication was discontinued. Subsequently, patient received no additional therapy. At this time, his son describes excessive somnolence with patient being able to fall asleep in the middle of the day while reading or while watching TV. Nevertheless, patient does not full asleep while engaged with others. He appears to rest well through the night as well without snoring. He wakes up only to urinate. Patient denies any fevers, chills, night sweats. No weight loss, no abdominal pain, early satiety. His appetite is currently excellent. He does take describe dyspnea with exertion, but he is able to maintain reasonable activity level that he is satisfied with.  Patient returns to the clinic today for continued hematological monitoring.  Reports feeling "better" overall, but continues to experience significant somnolence even throughout the day.  No fevers, chills, night sweats.  No chest pain or shortness of breath, or cough.  No dysuria or hematuria.  Appetite remains good.  Hematological History: --BM Bx, 02/04/14: (Size decreased in number microcytosis and polychromasia without rouleaux no nucleated red blood cells. Leukocytosis decreased in number with absolute neutropenia and absolute lymphopenia. No increase in blasts. Platelets are normal in number. Aspirate demonstrates reasonably normal results. Overall cellularity 60% no evidence of lymphomatous infiltrate or increase in blasts. Flow cytometry positive for presence of a small lambda monoclonal CD5 negative, CD10 negative B-cell population.Cytogen -- 46,XY, del(9)(q13q22) --BM Bx, 01/09/17: Bone marrow aspirate a spicular and contained insufficient particles for  evaluation. Peripheral blood smear demonstrated decreased erythrocytes with microcytosis hypochromasia, schistocytes, nucleated red blood cells. Decreased white blood cell count with absolute and relative neutropenia, and relative lymphocytosis and monocytosis. Overall bone marrow cellularity increased at approximately 80-85%, increased number of atypical megakaryocytes with extremely pleomorphic nuclei. Neutrophil maturation is partially arrested, no increase of blasts. Increased fibrosis.  Treatment Hx: --Lenalidomide, 02/05/17: Medication held single day of administration due to hospitalization with syncope.  Medical History: Past Medical History:  Diagnosis Date  . Diabetes mellitus without complication ([place])   . Hypertension   . Myelodysplasia (myelodysplastic syndrome) ([place])   . S/P placement of cardiac pacemaker 08/30/2017  . Sinus arrest   . Syncope   . Syncope and collapse 08/30/2017    Surgical History: Past Surgical History:  Procedure Laterality Date  . BONE MARROW BIOPSY  2017  . LOOP RECORDER REMOVAL N/A 08/29/2017   Procedure: LOOP RECORDER REMOVAL;  Surgeon: Deboraha Sprang, MD;  Location: [facility];  Service: Cardiovascular;  Laterality: N/A;  . PACEMAKER IMPLANT Left 08/29/2017   St Jude generator  . PACEMAKER IMPLANT N/A 08/29/2017   Procedure: Pacemaker Implant;  Surgeon: Deboraha Sprang, MD;  Location: [place] [facility];  Service: Cardiovascular;  Laterality: N/A;  . PORTACATH PLACEMENT  2017    Family History: Family History  Problem Relation Age of Onset  . Hypertension Mother     Social History: Social History   Socioeconomic History  . Marital status: Divorced    Spouse name: Not on file  . Number of children: Not on file  . Years of education: Not on file  . Highest education level: Not on file  Social Needs  . Financial resource strain: Not on file  . Food insecurity - worry: Not on file  . Food insecurity - inability: Not on file  .  Transportation needs - medical: Not on file  . Transportation needs - non-medical: Not on file  Occupational History  . Not on file  Tobacco Use  . Smoking status: Never Smoker  . Smokeless tobacco: Never Used  Substance and Sexual Activity  . Alcohol use: No  . Drug use: No  . Sexual activity: Not on file  Other Topics Concern  . Not on file  Social History Narrative  . Not on file    Allergies: No Known Allergies  Medications:  Current Outpatient Medications  Medication Sig Dispense Refill  . acetaminophen (TYLENOL) 325 MG tablet Take 2 tablets (650 mg total) by mouth every 4 (four) hours as needed for mild pain.    Marland Kitchen aspirin EC 81 MG tablet Take 81 mg by mouth daily.    Marland Kitchen lidocaine (XYLOCAINE) 2 % solution Apply small amount to painful gums with a qtip as needed.  Avoid swallowing 100 mL 0  . NITROSTAT 0.4 MG SL tablet Place 1 tablet (0.4 mg total) under the tongue every 5 (five) minutes as needed for chest pain. 25 tablet 3  . ACCU-CHEK FASTCLIX LANCETS MISC Use as directed 3 times daily. E11.9 100 each 12  . amLODipine (NORVASC) 10 MG tablet TAKE 1 TABLET BY MOUTH DAILY 90 tablet 3  . Blood Glucose Monitoring Suppl (ACCU-CHEK AVIVA PLUS) w/Device KIT Use as directed 3 times daily. E11.9 1 kit 0  . glucose blood (ACCU-CHEK AVIVA PLUS) test strip Use as directed 3 times daily. E11.9 100 each 12  . Insulin Glargine (LANTUS SOLOSTAR) 100 UNIT/ML Solostar Pen Inject 50 Units into the skin every morning. 15 mL 3  . insulin regular (HUMULIN R) 100 units/mL injection Inject 0.1 mLs (10 Units total) into the skin 3 (three) times daily before meals. For Blood Sugar greater than 250 10 mL 11  . isosorbide mononitrate (IMDUR) 30 MG 24 hr tablet Take 1 tablet (30 mg total) by mouth daily. 90 tablet 3  . ruxolitinib phosphate (JAKAFI) 10 MG tablet Take 1 tablet (10 mg total) by mouth 2 (two) times daily. 60 tablet 0  . tamsulosin (FLOMAX) 0.4 MG CAPS capsule Take 1 capsule (0.4 mg total) by  mouth daily. 30 capsule 3   No current facility-administered medications for this visit.     Review of Systems: Review of Systems  Constitutional: Positive for fatigue.       Patient and his son report excessive somnolence with the patient being able to follow sleep any time not actively engaged with other people.  Neurological: Negative for headaches, seizures and speech difficulty.  All other systems reviewed and are negative.    PHYSICAL EXAMINATION Blood pressure (!) 152/83, pulse 70, resp. rate 17, height _0  (1.651 m), weight 147 lb 3.2 oz (66.8 kg), SpO2 100 %.  ECOG PERFORMANCE STATUS: 2 - Symptomatic, <50% confined to bed  Physical Exam  Constitutional:  Elderly frail male in no acute distress  HENT:  Head: Normocephalic  and atraumatic.  Mouth/Throat: Oropharynx is clear and moist. No oropharyngeal exudate.  Eyes: EOM are normal. Pupils are equal, round, and reactive to light. No scleral icterus.  Neck: No thyromegaly present.  Cardiovascular: Normal rate and regular rhythm.  No murmur heard. Pulmonary/Chest: Breath sounds normal. No respiratory distress. He has no wheezes.  Abdominal: Soft. He exhibits no distension.  No hepatosplenomegaly  Musculoskeletal: He exhibits no edema.  Lymphadenopathy:    He has no cervical adenopathy.  Neurological: He is alert. He has normal reflexes. No cranial nerve deficit.  Skin: Skin is warm. No rash noted. No pallor.      LABORATORY DATA: I have personally reviewed the data as listed: Appointment on 11/19/2017  Component Date Value Ref Range Status  . WBC 11/19/2017 8.6  4.0 - 10.3 10e3/uL Final  . HGB 11/19/2017 9.8* 13.0 - 17.1 g/dL Final  . HCT 11/19/2017 32.2* 38.4 - 49.9 % Final  . Platelets 11/19/2017 240  140 - 400 10e3/uL Final  . MCV 11/19/2017 74.9* 79.3 - 98.0 fL Final  . MCH 11/19/2017 22.8* 27.2 - 33.4 pg Final  . MCHC 11/19/2017 30.4* 32.0 - 36.0 g/dL Final  . RBC 11/19/2017 4.30  4.20 - 5.82 10e6/uL  Final  . RDW 11/19/2017 26.2* 11.0 - 14.6 % Final  . ANC ([facility] manual diff) 11/19/2017 2.2  1.5 - 6.5 10e3/uL Final  . ALC 11/19/2017 4.1* 0.9 - 3.3 10e3/uL Final  . SEG 11/19/2017 21* 38 - 77 % Final  . LYMPH 11/19/2017 47  14 - 49 % Final  . MONO 11/19/2017 13  0 - 14 % Final  . EOS 11/19/2017 3  0 - 7 % Final  . Basophil 11/19/2017 8* 0 - 2 % Final  . Metamyelocytes 11/19/2017 2* 0 - 0 % Final  . Myelocytes 11/19/2017 2* 0 - 0 % Final  . Blasts 11/19/2017 4* 0 - 0 % Final  . nRBC 11/19/2017 68* 0 - 0 % Final  . Polychromasia 11/19/2017 sl  Slight Final  . Tear Drop Cells 11/19/2017 Few  Negative Final  . Ovalocytes 11/19/2017 Moderate  Negative Final  . Shistocytes 11/19/2017 Few  Negative Final  . Target Cells 11/19/2017 Few  Negative Final  . PLT EST 11/19/2017 Adequate  Adequate Final  . Platelet Morphology 11/19/2017 Giant Platelets  Within Normal Limits Final       Ardath Sax, MD

## 2017-12-24 ENCOUNTER — Ambulatory Visit (HOSPITAL_COMMUNITY)
Admission: RE | Admit: 2017-12-24 | Discharge: 2017-12-24 | Disposition: A | Discharge status: Home / Self Care | Payer: Medicare Other | Source: Ambulatory Visit | Attending: Hematology and Oncology | Admitting: Hematology and Oncology

## 2017-12-24 DIAGNOSIS — E119 Type 2 diabetes mellitus without complications: Secondary | ICD-10-CM | POA: Diagnosis not present

## 2017-12-24 DIAGNOSIS — Z7982 Long term (current) use of aspirin: Secondary | ICD-10-CM | POA: Diagnosis not present

## 2017-12-24 DIAGNOSIS — I10 Essential (primary) hypertension: Secondary | ICD-10-CM | POA: Diagnosis not present

## 2017-12-24 DIAGNOSIS — D469 Myelodysplastic syndrome, unspecified: Secondary | ICD-10-CM | POA: Diagnosis not present

## 2017-12-24 DIAGNOSIS — Z950 Presence of cardiac pacemaker: Secondary | ICD-10-CM | POA: Diagnosis not present

## 2017-12-24 DIAGNOSIS — D649 Anemia, unspecified: Secondary | ICD-10-CM | POA: Diagnosis not present

## 2017-12-25 DIAGNOSIS — I10 Essential (primary) hypertension: Secondary | ICD-10-CM | POA: Diagnosis not present

## 2017-12-25 DIAGNOSIS — Z7982 Long term (current) use of aspirin: Secondary | ICD-10-CM | POA: Diagnosis not present

## 2017-12-25 DIAGNOSIS — D649 Anemia, unspecified: Secondary | ICD-10-CM | POA: Diagnosis not present

## 2017-12-25 DIAGNOSIS — D469 Myelodysplastic syndrome, unspecified: Secondary | ICD-10-CM | POA: Diagnosis not present

## 2017-12-25 DIAGNOSIS — E119 Type 2 diabetes mellitus without complications: Secondary | ICD-10-CM | POA: Diagnosis not present

## 2017-12-25 DIAGNOSIS — Z950 Presence of cardiac pacemaker: Secondary | ICD-10-CM | POA: Diagnosis not present

## 2017-12-29 ENCOUNTER — Telehealth: Payer: Self-pay

## 2017-12-29 NOTE — Telephone Encounter (Signed)
Per Dr. Lebron Conners, ok to refill Jakafi 10 mg. Prescription called in to [facility]. Pt.'s son [name] made aware.

## 2017-12-31 DIAGNOSIS — D469 Myelodysplastic syndrome, unspecified: Secondary | ICD-10-CM | POA: Diagnosis not present

## 2017-12-31 DIAGNOSIS — I10 Essential (primary) hypertension: Secondary | ICD-10-CM | POA: Diagnosis not present

## 2017-12-31 DIAGNOSIS — Z950 Presence of cardiac pacemaker: Secondary | ICD-10-CM | POA: Diagnosis not present

## 2017-12-31 DIAGNOSIS — Z7982 Long term (current) use of aspirin: Secondary | ICD-10-CM | POA: Diagnosis not present

## 2017-12-31 DIAGNOSIS — D649 Anemia, unspecified: Secondary | ICD-10-CM | POA: Diagnosis not present

## 2017-12-31 DIAGNOSIS — E119 Type 2 diabetes mellitus without complications: Secondary | ICD-10-CM | POA: Diagnosis not present

## 2018-01-01 ENCOUNTER — Inpatient Hospital Stay: Payer: Medicare Other

## 2018-01-01 ENCOUNTER — Telehealth: Payer: Self-pay | Admitting: Hematology and Oncology

## 2018-01-01 ENCOUNTER — Inpatient Hospital Stay: Payer: Medicare Other | Attending: Hematology and Oncology | Admitting: Hematology and Oncology

## 2018-01-01 ENCOUNTER — Encounter: Payer: Self-pay | Admitting: Hematology and Oncology

## 2018-01-01 ENCOUNTER — Other Ambulatory Visit: Payer: Self-pay

## 2018-01-01 VITALS — BP 152/63 | HR 71 | Temp 98.2°F | Resp 18 | Ht 67.0 in | Wt 145.7 lb

## 2018-01-01 DIAGNOSIS — Z5111 Encounter for antineoplastic chemotherapy: Secondary | ICD-10-CM

## 2018-01-01 DIAGNOSIS — F329 Major depressive disorder, single episode, unspecified: Secondary | ICD-10-CM | POA: Diagnosis not present

## 2018-01-01 DIAGNOSIS — R0609 Other forms of dyspnea: Secondary | ICD-10-CM | POA: Diagnosis not present

## 2018-01-01 DIAGNOSIS — D7581 Myelofibrosis: Secondary | ICD-10-CM

## 2018-01-01 DIAGNOSIS — D471 Chronic myeloproliferative disease: Secondary | ICD-10-CM | POA: Insufficient documentation

## 2018-01-01 DIAGNOSIS — I10 Essential (primary) hypertension: Secondary | ICD-10-CM | POA: Insufficient documentation

## 2018-01-01 DIAGNOSIS — Z950 Presence of cardiac pacemaker: Secondary | ICD-10-CM | POA: Diagnosis not present

## 2018-01-01 DIAGNOSIS — Z7982 Long term (current) use of aspirin: Secondary | ICD-10-CM | POA: Diagnosis not present

## 2018-01-01 DIAGNOSIS — R400 Somnolence: Secondary | ICD-10-CM | POA: Insufficient documentation

## 2018-01-01 DIAGNOSIS — D469 Myelodysplastic syndrome, unspecified: Secondary | ICD-10-CM | POA: Diagnosis not present

## 2018-01-01 DIAGNOSIS — E119 Type 2 diabetes mellitus without complications: Secondary | ICD-10-CM | POA: Diagnosis not present

## 2018-01-01 DIAGNOSIS — D649 Anemia, unspecified: Secondary | ICD-10-CM | POA: Diagnosis not present

## 2018-01-01 LAB — CBC WITH DIFFERENTIAL/PLATELET
BASOS ABS: 0.7 10*3/uL — AB (ref 0.0–0.1)
BLASTS: 7 %
Band Neutrophils: 7 %
Basophils Relative: 8 %
Eosinophils Absolute: 0.2 10*3/uL (ref 0.0–0.5)
Eosinophils Relative: 2 %
HEMATOCRIT: 29.5 % — AB (ref 38.4–49.9)
Hemoglobin: 9 g/dL — ABNORMAL LOW (ref 13.0–17.1)
Lymphocytes Relative: 21 %
Lymphs Abs: 1.8 10*3/uL (ref 0.9–3.3)
MCH: 23.1 pg — ABNORMAL LOW (ref 27.2–33.4)
MCHC: 30.5 g/dL — ABNORMAL LOW (ref 32.0–36.0)
MCV: 75.6 fL — AB (ref 79.3–98.0)
METAMYELOCYTES PCT: 10 %
Monocytes Absolute: 1 10*3/uL — ABNORMAL HIGH (ref 0.1–0.9)
Monocytes Relative: 12 %
Myelocytes: 10 %
Neutro Abs: 4.3 10*3/uL (ref 1.5–6.5)
Neutrophils Relative %: 23 %
Other: 0 %
PROMYELOCYTES ABS: 0 %
Platelets: 120 10*3/uL — ABNORMAL LOW (ref 140–400)
RBC: 3.9 MIL/uL — AB (ref 4.20–5.82)
RDW: 25.8 % — ABNORMAL HIGH (ref 11.0–15.6)
WBC: 8.6 10*3/uL (ref 4.0–10.3)
nRBC: 33 /100 WBC — ABNORMAL HIGH

## 2018-01-01 LAB — COMPREHENSIVE METABOLIC PANEL
ALBUMIN: 3.5 g/dL (ref 3.5–5.0)
ALT: 16 U/L (ref 0–55)
AST: 35 U/L — AB (ref 5–34)
Alkaline Phosphatase: 136 U/L (ref 40–150)
Anion gap: 9 (ref 3–11)
BUN: 14 mg/dL (ref 7–26)
CHLORIDE: 104 mmol/L (ref 98–109)
CO2: 23 mmol/L (ref 22–29)
CREATININE: 1.47 mg/dL — AB (ref 0.70–1.30)
Calcium: 8.9 mg/dL (ref 8.4–10.4)
GFR calc Af Amer: 48 mL/min — ABNORMAL LOW (ref >60)
GFR, EST NON AFRICAN AMERICAN: 42 mL/min — AB (ref >60)
GLUCOSE: 290 mg/dL — AB (ref 70–140)
Potassium: 4.5 mmol/L (ref 3.5–5.1)
Sodium: 136 mmol/L (ref 136–145)
Total Bilirubin: 1.1 mg/dL (ref 0.2–1.2)
Total Protein: 8.1 g/dL (ref 6.4–8.3)

## 2018-01-01 LAB — LACTATE DEHYDROGENASE: LDH: 1562 U/L — AB (ref 125–245)

## 2018-01-01 LAB — URIC ACID: Uric Acid, Serum: 7.9 mg/dL — ABNORMAL HIGH (ref 2.6–7.4)

## 2018-01-01 LAB — MAGNESIUM: Magnesium: 2.3 mg/dL (ref 1.5–2.5)

## 2018-01-01 NOTE — Telephone Encounter (Signed)
Gave avs and calendar for february °

## 2018-01-02 ENCOUNTER — Telehealth: Payer: Self-pay | Admitting: Internal Medicine

## 2018-01-02 DIAGNOSIS — D469 Myelodysplastic syndrome, unspecified: Secondary | ICD-10-CM | POA: Diagnosis not present

## 2018-01-02 DIAGNOSIS — I10 Essential (primary) hypertension: Secondary | ICD-10-CM | POA: Diagnosis not present

## 2018-01-02 DIAGNOSIS — E119 Type 2 diabetes mellitus without complications: Secondary | ICD-10-CM | POA: Diagnosis not present

## 2018-01-02 DIAGNOSIS — D649 Anemia, unspecified: Secondary | ICD-10-CM | POA: Diagnosis not present

## 2018-01-02 DIAGNOSIS — Z7982 Long term (current) use of aspirin: Secondary | ICD-10-CM | POA: Diagnosis not present

## 2018-01-02 DIAGNOSIS — Z950 Presence of cardiac pacemaker: Secondary | ICD-10-CM | POA: Diagnosis not present

## 2018-01-02 NOTE — Telephone Encounter (Signed)
Spoke to nurse from [facility]. She states pt took BP medication today. She took orthostatic vital signs with readings: Lying:108/62 Sitting:100/60 Standing: 82/60  Heart rate in 70's.   Per nurse that was assessing patient,  pt is A&O to baseline to dementia.   Advised for blood sugars of 222 to take medication as directed and drink plenty of water to lower blood sugar level.   Left message on voicemail: pt to go to closest ED or Urgent Care for worsening sx's.

## 2018-01-02 NOTE — Telephone Encounter (Signed)
Levada Dy from [place] called stating that pt. Blood sugar this a.m. Was 344 and know it's 222. Rep states that pt. Has blurry vision, and dizzy. Rep. Would also like to know if he soul continue taking his amlodipine. Please f/u

## 2018-01-05 DIAGNOSIS — I10 Essential (primary) hypertension: Secondary | ICD-10-CM | POA: Diagnosis not present

## 2018-01-05 DIAGNOSIS — D469 Myelodysplastic syndrome, unspecified: Secondary | ICD-10-CM | POA: Diagnosis not present

## 2018-01-05 DIAGNOSIS — E119 Type 2 diabetes mellitus without complications: Secondary | ICD-10-CM | POA: Diagnosis not present

## 2018-01-05 DIAGNOSIS — Z7982 Long term (current) use of aspirin: Secondary | ICD-10-CM | POA: Diagnosis not present

## 2018-01-05 DIAGNOSIS — Z950 Presence of cardiac pacemaker: Secondary | ICD-10-CM | POA: Diagnosis not present

## 2018-01-05 DIAGNOSIS — D649 Anemia, unspecified: Secondary | ICD-10-CM | POA: Diagnosis not present

## 2018-01-05 NOTE — Telephone Encounter (Signed)
Attempt to f/u with patient. Left message on voicemail to  Return call.

## 2018-01-06 DIAGNOSIS — E119 Type 2 diabetes mellitus without complications: Secondary | ICD-10-CM | POA: Diagnosis not present

## 2018-01-06 DIAGNOSIS — D469 Myelodysplastic syndrome, unspecified: Secondary | ICD-10-CM | POA: Diagnosis not present

## 2018-01-06 DIAGNOSIS — Z7982 Long term (current) use of aspirin: Secondary | ICD-10-CM | POA: Diagnosis not present

## 2018-01-06 DIAGNOSIS — Z950 Presence of cardiac pacemaker: Secondary | ICD-10-CM | POA: Diagnosis not present

## 2018-01-06 DIAGNOSIS — I10 Essential (primary) hypertension: Secondary | ICD-10-CM | POA: Diagnosis not present

## 2018-01-06 DIAGNOSIS — D649 Anemia, unspecified: Secondary | ICD-10-CM | POA: Diagnosis not present

## 2018-01-07 DIAGNOSIS — D469 Myelodysplastic syndrome, unspecified: Secondary | ICD-10-CM | POA: Diagnosis not present

## 2018-01-07 DIAGNOSIS — D649 Anemia, unspecified: Secondary | ICD-10-CM | POA: Diagnosis not present

## 2018-01-07 DIAGNOSIS — Z7982 Long term (current) use of aspirin: Secondary | ICD-10-CM | POA: Diagnosis not present

## 2018-01-07 DIAGNOSIS — E119 Type 2 diabetes mellitus without complications: Secondary | ICD-10-CM | POA: Diagnosis not present

## 2018-01-07 DIAGNOSIS — Z950 Presence of cardiac pacemaker: Secondary | ICD-10-CM | POA: Diagnosis not present

## 2018-01-07 DIAGNOSIS — I10 Essential (primary) hypertension: Secondary | ICD-10-CM | POA: Diagnosis not present

## 2018-01-08 DIAGNOSIS — Z950 Presence of cardiac pacemaker: Secondary | ICD-10-CM | POA: Diagnosis not present

## 2018-01-08 DIAGNOSIS — D649 Anemia, unspecified: Secondary | ICD-10-CM | POA: Diagnosis not present

## 2018-01-08 DIAGNOSIS — Z7982 Long term (current) use of aspirin: Secondary | ICD-10-CM | POA: Diagnosis not present

## 2018-01-08 DIAGNOSIS — I10 Essential (primary) hypertension: Secondary | ICD-10-CM | POA: Diagnosis not present

## 2018-01-08 DIAGNOSIS — D469 Myelodysplastic syndrome, unspecified: Secondary | ICD-10-CM | POA: Diagnosis not present

## 2018-01-08 DIAGNOSIS — E119 Type 2 diabetes mellitus without complications: Secondary | ICD-10-CM | POA: Diagnosis not present

## 2018-01-12 DIAGNOSIS — Z950 Presence of cardiac pacemaker: Secondary | ICD-10-CM | POA: Diagnosis not present

## 2018-01-12 DIAGNOSIS — Z7982 Long term (current) use of aspirin: Secondary | ICD-10-CM | POA: Diagnosis not present

## 2018-01-12 DIAGNOSIS — I10 Essential (primary) hypertension: Secondary | ICD-10-CM | POA: Diagnosis not present

## 2018-01-12 DIAGNOSIS — D649 Anemia, unspecified: Secondary | ICD-10-CM | POA: Diagnosis not present

## 2018-01-12 DIAGNOSIS — E119 Type 2 diabetes mellitus without complications: Secondary | ICD-10-CM | POA: Diagnosis not present

## 2018-01-12 DIAGNOSIS — D469 Myelodysplastic syndrome, unspecified: Secondary | ICD-10-CM | POA: Diagnosis not present

## 2018-01-12 NOTE — Progress Notes (Signed)
[facility] [facility] Follow-up Visit:  Assessment: Myelofibrosis [facility]) 84 y.o. male with diagnosis of primary myelofibrosis with associated neutropenia without recurrent infections, anemia not requiring transfusions and thrombocytosis. Patient has been having hematological abnormalities from extended period of time, the earliest noted 2005. Patient underwent at least 2 bone marrow biopsy evaluations, most recent on 01/09/17 documenting the recurrent condition. IPSS score 3 -- high risk, mainly due to advanced age of the patient, anemia, and some systemic symptoms attribution of which is a little bit challenging. Patient's somnolence is not clearly related to myelofibrosis. He does not appear to have any problems with splenomegaly based on physical exam or symptoms and his appetite is excellent.   Patient reports overall improvement in his symptoms since initiation of ruxolitinib.  His appetite is getting better, but patient has not adjusted his activity level which I suspect this contributed to by no small degree of depression.  Plan: --Continue ruxolitinib 25m PO BID --Will try addressing depressive symptoms with behavioral changes for the time being, if persistent abnormalities are noted, will refer to his primary care provider for possible placement on antidepressant medications --Return to clinic in 1 month with labs as outlined below, clinic visit.  Voice recognition software was used and creation of this note. Despite my best effort at editing the text, some misspelling/errors may have occurred.   Orders Placed This Encounter  Procedures  . CBC with Differential (Cancer Center Only)    Standing Status:   Future    Standing Expiration Date:   01/01/2019  . Lipid panel    Standing Status:   Future    Standing Expiration Date:   01/01/2019  . CMP ([facility])    Standing Status:   Future    Standing Expiration Date:   01/01/2019    Cancer Staging No matching staging  information was found for the patient.  All questions were answered.  . The patient knows to call the clinic with any problems, questions or concerns.  This note was electronically signed.    History of Presenting Illness SRamal Eckhardt89y.o. [facility] the [facility] of myelofib[facility]. Patient returns to the clinic to discuss on review of the previous records. No new complaints since last visit to the clinic. It appears that he no longer experiences headaches, lightheadedness, or dizziness as his blood pressure control has improved.  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 Initially, patient was treated with lenalidomide, but experienced a syncopal event medication was discontinued. Subsequently, patient received no additional therapy. At this time, his son describes excessive somnolence with patient being able to fall asleep in the middle of the day while reading or while watching TV. Nevertheless, patient does not full asleep while engaged with others. He appears to rest well through the night as well without snoring. He wakes up only to urinate. Patient denies any fevers, chills, night sweats. No weight loss, no abdominal pain, early satiety. His appetite is currently excellent. He does take describe dyspnea with exertion, but he is able to maintain reasonable activity level that he is satisfied with.  Patient returns to the clinic today for continued hematological monitoring.  Reports feeling "better" overall, but continues to experience significant somnolence even throughout the day.  Patient reports a significant improvement in his appetite.  No fevers, chills, night sweats.  No chest pain or shortness of breath, or cough.  No dysuria or hematuria.  Since last visit to the clinic, patient had an emergency room visit on 12/06/17 with complaints of weakness and  shortness of breath.  Evaluated by cardiology and found to have a fragile combination of severe hypertension coupled with severe orthostatic drops in blood pressure is making antihypertensive management challenging.  Continues to function minimally at home, according to his son, patient is not pushing himself to function better, but prefers to be attended by his family.  Patient acknowledges some elements of depression, but is willing to increase his activity and get out of his room more.  Hematological History: --BM Bx, 02/04/14: (Size decreased in number microcytosis and polychromasia without rouleaux no nucleated red blood cells. Leukocytosis decreased in number with absolute neutropenia and absolute lymphopenia. No increase in blasts. Platelets are normal in number. Aspirate demonstrates reasonably normal results. Overall cellularity 60% no evidence of lymphomatous infiltrate or increase in blasts. Flow cytometry positive for presence of a small lambda monoclonal CD5 negative, CD10 negative B-cell population.Cytogen -- 46,XY, del(9)(q13q22) --BM Bx, 01/09/17: Bone marrow aspirate a spicular and contained insufficient particles for evaluation. Peripheral blood smear demonstrated decreased erythrocytes with microcytosis hypochromasia, schistocytes, nucleated red blood cells. Decreased white blood cell count with absolute and relative neutropenia, and relative lymphocytosis and monocytosis. Overall bone marrow cellularity increased at approximately 80-85%, increased number of atypical megakaryocytes with extremely pleomorphic nuclei. Neutrophil maturation is partially arrested, no increase of blasts. Increased fibrosis. --Labs, 01/01/17: WBC 8.6, ANC 4.3, ALC 1.8, Mono 1.8, Eos 0.2, Baso 0.7, Hgb 9.0, MCV 75.6, MCH 23.1, RDW 25.8, Plt 120; LFTs WNL, LDH 1562, Uric acid 7.9;  Treatment Hx: --Lenalidomide, 02/05/17: Medication held single day of administration due to hospitalization with  syncope. --Ruxolitinib Shanon Brow), 11/19/17-:   Medical History: Past Medical History:  Diagnosis Date  . Diabetes mellitus without complication ([place])   . Hypertension   . Myelodysplasia (myelodysplastic syndrome) ([place])   . S/P placement of cardiac pacemaker 08/30/2017  . Sinus arrest   . Syncope   . Syncope and collapse 08/30/2017    Surgical History: Past Surgical History:  Procedure Laterality Date  . BONE MARROW BIOPSY  2017  . LOOP RECORDER REMOVAL N/A 08/29/2017   Procedure: LOOP RECORDER REMOVAL;  Surgeon: Deboraha Sprang, MD;  Location: [place] [facility];  Service: Cardiovascular;  Laterality: N/A;  . PACEMAKER IMPLANT Left 08/29/2017   St Jude generator  . PACEMAKER IMPLANT N/A 08/29/2017   Procedure: Pacemaker Implant;  Surgeon: Deboraha Sprang, MD;  Location: [place] [facility];  Service: Cardiovascular;  Laterality: N/A;  . PORTACATH PLACEMENT  2017    Family History: Family History  Problem Relation Age of Onset  . Hypertension Mother     Social History: Social History   Socioeconomic History  . Marital status: Divorced    Spouse name: Not on file  . Number of children: Not on file  . Years of education: Not on file  . Highest education level: Not on file  Social Needs  . Financial resource strain: Not on file  . Food insecurity - worry: Not on file  . Food insecurity - inability: Not on file  . Transportation needs - medical: Not on file  . Transportation needs - non-medical: Not on file  Occupational History  . Not on file  Tobacco Use  . Smoking status: Never Smoker  . Smokeless tobacco: Never Used  Substance and Sexual Activity  . Alcohol use: No  . Drug use: No  .  Sexual activity: Not on file  Other Topics Concern  . Not on file  Social History Narrative  . Not on file    Allergies: No Known Allergies  Medications:  Current Outpatient Medications  Medication Sig Dispense Refill  . ACCU-CHEK FASTCLIX LANCETS MISC Use as directed 3 times  daily. E11.9 100 each 12  . acetaminophen (TYLENOL) 325 MG tablet Take 2 tablets (650 mg total) by mouth every 4 (four) hours as needed for mild pain.    Marland Kitchen amLODipine (NORVASC) 10 MG tablet TAKE 1 TABLET BY MOUTH DAILY 90 tablet 3  . aspirin EC 81 MG tablet Take 81 mg by mouth daily.    . Blood Glucose Monitoring Suppl (ACCU-CHEK AVIVA PLUS) w/Device KIT Use as directed 3 times daily. E11.9 1 kit 0  . glucose blood (ACCU-CHEK AVIVA PLUS) test strip Use as directed 3 times daily. E11.9 100 each 12  . Insulin Glargine (LANTUS SOLOSTAR) 100 UNIT/ML Solostar Pen Inject 50 Units into the skin every morning. 15 mL 3  . insulin regular (HUMULIN R) 100 units/mL injection Inject 0.1 mLs (10 Units total) into the skin 3 (three) times daily before meals. For Blood Sugar greater than 250 10 mL 11  . isosorbide mononitrate (IMDUR) 30 MG 24 hr tablet Take 1 tablet (30 mg total) by mouth daily. 90 tablet 3  . lidocaine (XYLOCAINE) 2 % solution Apply small amount to painful gums with a qtip as needed.  Avoid swallowing 100 mL 0  . NITROSTAT 0.4 MG SL tablet Place 1 tablet (0.4 mg total) under the tongue every 5 (five) minutes as needed for chest pain. 25 tablet 3  . ruxolitinib phosphate (JAKAFI) 10 MG tablet Take 1 tablet (10 mg total) by mouth 2 (two) times daily. 60 tablet 0  . tamsulosin (FLOMAX) 0.4 MG CAPS capsule Take 1 capsule (0.4 mg total) by mouth daily. 30 capsule 3   No current facility-administered medications for this visit.     Review of Systems: Review of Systems  Constitutional: Positive for appetite change and fatigue.       Patient and his son report excessive somnolence with the patient being able to follow sleep any time not actively engaged with other people.  Neurological: Positive for dizziness. Negative for seizures and speech difficulty.  All other systems reviewed and are negative.    PHYSICAL EXAMINATION Blood pressure (!) 152/63, pulse 71, temperature 98.2 F (36.8 C),  temperature source Oral, resp. rate 18, height _0  (1.702 m), weight 145 lb 11.2 oz (66.1 kg), SpO2 100 %.  ECOG PERFORMANCE STATUS: 2 - Symptomatic, <50% confined to bed  Physical Exam  Constitutional:  Elderly frail male in no acute distress  HENT:  Head: Normocephalic and atraumatic.  Mouth/Throat: Oropharynx is clear and moist. No oropharyngeal exudate.  Eyes: EOM are normal. Pupils are equal, round, and reactive to light. No scleral icterus.  Neck: No thyromegaly present.  Cardiovascular: Normal rate and regular rhythm.  No murmur heard. Pulmonary/Chest: Breath sounds normal. No respiratory distress. He has no wheezes.  Abdominal: Soft. He exhibits no distension.  No hepatosplenomegaly  Musculoskeletal: He exhibits no edema.  Lymphadenopathy:    He has no cervical adenopathy.  Neurological: He is alert. He has normal reflexes. No cranial nerve deficit.  Skin: Skin is warm. No rash noted. No pallor.      LABORATORY DATA: I have personally reviewed the data as listed: Appointment on 01/01/2018  Component Date Value Ref Range Status  . WBC  01/01/2018 8.6  4.0 - 10.3 K/uL Final  . RBC 01/01/2018 3.90* 4.20 - 5.82 MIL/uL Final  . Hemoglobin 01/01/2018 9.0* 13.0 - 17.1 g/dL Final  . HCT 01/01/2018 29.5* 38.4 - 49.9 % Final  . MCV 01/01/2018 75.6* 79.3 - 98.0 fL Final  . MCH 01/01/2018 23.1* 27.2 - 33.4 pg Final  . MCHC 01/01/2018 30.5* 32.0 - 36.0 g/dL Final  . RDW 01/01/2018 25.8* 11.0 - 15.6 % Final  . Platelets 01/01/2018 120* 140 - 400 K/uL Final   large and giant platelets  . Neutrophils Relative % 01/01/2018 23  % Final  . Lymphocytes Relative 01/01/2018 21  % Final  . Monocytes Relative 01/01/2018 12  % Final  . Eosinophils Relative 01/01/2018 2  % Final  . Basophils Relative 01/01/2018 8  % Final  . Band Neutrophils 01/01/2018 7  % Final  . Metamyelocytes Relative 01/01/2018 10  % Final  . Myelocytes 01/01/2018 10  % Final  . Promyelocytes Absolute 01/01/2018 0   % Final  . Blasts 01/01/2018 7  % Final  . nRBC 01/01/2018 33* 0 /100 WBC Final  . Other 01/01/2018 0  % Final  . Neutro Abs 01/01/2018 4.3  1.5 - 6.5 K/uL Final  . Lymphs Abs 01/01/2018 1.8  0.9 - 3.3 K/uL Final  . Monocytes Absolute 01/01/2018 1.0* 0.1 - 0.9 K/uL Final  . Eosinophils Absolute 01/01/2018 0.2  0.0 - 0.5 K/uL Final  . Basophils Absolute 01/01/2018 0.7* 0.0 - 0.1 K/uL Final  . Smear Review 01/01/2018 Mod polychromasia, few teardrops and ovaolocytes   Final   Performed at [facility], [place] [address] [place], [place] [address]  . Sodium 01/01/2018 136  136 - 145 mmol/L Final  . Potassium 01/01/2018 4.5  3.5 - 5.1 mmol/L Final  . Chloride 01/01/2018 104  98 - 109 mmol/L Final  . CO2 01/01/2018 23  22 - 29 mmol/L Final  . Glucose, Bld 01/01/2018 290* 70 - 140 mg/dL Final  . BUN 01/01/2018 14  7 - 26 mg/dL Final  . Creatinine, Ser 01/01/2018 1.47* 0.70 - 1.30 mg/dL Final  . Calcium 01/01/2018 8.9  8.4 - 10.4 mg/dL Final  . Total Protein 01/01/2018 8.1  6.4 - 8.3 g/dL Final  . Albumin 01/01/2018 3.5  3.5 - 5.0 g/dL Final  . AST 01/01/2018 35* 5 - 34 U/L Final  . ALT 01/01/2018 16  0 - 55 U/L Final  . Alkaline Phosphatase 01/01/2018 136  40 - 150 U/L Final  . Total Bilirubin 01/01/2018 1.1  0.2 - 1.2 mg/dL Final  . GFR calc non Af Amer 01/01/2018 42* >60 mL/min Final  . GFR calc Af Amer 01/01/2018 48* >60 mL/min Final   Comment: (NOTE) The eGFR has been calculated using the CKD EPI equation. This calculation has not been validated in all clinical situations. eGFR's persistently <60 mL/min signify possible Chronic Kidney Disease.   Georgiann Hahn gap 01/01/2018 9  3 - 11 Final   Performed at [facility], [place] [address] [place], [place] [address]  . LDH 01/01/2018 1,562* 125 - 245 U/L Final   Performed at [facility], [place] [address], [place], [place] [address]  . Uric Acid, Serum 01/01/2018 7.9* 2.6 -  7.4 mg/dL Final   Performed at [facility], [place] [address], [place], [place] [address]  . Magnesium 01/01/2018 2.3  1.5 - 2.5 mg/dL Final   Performed at [facility], [place] [address], [place], [place] [address]       Ardath Sax, MD

## 2018-01-12 NOTE — Assessment & Plan Note (Signed)
84 y.o. male with diagnosis of primary myelofibrosis with associated neutropenia without recurrent infections, anemia not requiring transfusions and thrombocytosis. Patient has been having hematological abnormalities from extended period of time, the earliest noted 2005. Patient underwent at least 2 bone marrow biopsy evaluations, most recent on 01/09/17 documenting the recurrent condition. IPSS score 3 -- high risk, mainly due to advanced age of the patient, anemia, and some systemic symptoms attribution of which is a little bit challenging. Patient's somnolence is not clearly related to myelofibrosis. He does not appear to have any problems with splenomegaly based on physical exam or symptoms and his appetite is excellent.   Patient reports overall improvement in his symptoms since initiation of ruxolitinib.  His appetite is getting better, but patient has not adjusted his activity level which I suspect this contributed to by no small degree of depression.  Plan: --Continue ruxolitinib 70m PO BID --Will try addressing depressive symptoms with behavioral changes for the time being, if persistent abnormalities are noted, will refer to his primary care provider for possible placement on antidepressant medications --Return to clinic in 1 month with labs as outlined below, clinic visit.

## 2018-01-13 DIAGNOSIS — D649 Anemia, unspecified: Secondary | ICD-10-CM | POA: Diagnosis not present

## 2018-01-13 DIAGNOSIS — Z950 Presence of cardiac pacemaker: Secondary | ICD-10-CM | POA: Diagnosis not present

## 2018-01-13 DIAGNOSIS — E119 Type 2 diabetes mellitus without complications: Secondary | ICD-10-CM | POA: Diagnosis not present

## 2018-01-13 DIAGNOSIS — Z7982 Long term (current) use of aspirin: Secondary | ICD-10-CM | POA: Diagnosis not present

## 2018-01-13 DIAGNOSIS — I10 Essential (primary) hypertension: Secondary | ICD-10-CM | POA: Diagnosis not present

## 2018-01-13 DIAGNOSIS — D469 Myelodysplastic syndrome, unspecified: Secondary | ICD-10-CM | POA: Diagnosis not present

## 2018-01-13 NOTE — Telephone Encounter (Signed)
Kory from [facility] called stating that she went to see the pt. Today and the pt. Stated that he has to urinate every 20 minutes. Pt. Also stated that he feels that he does not empty his bladder. Please f/u with pt.

## 2018-01-15 DIAGNOSIS — E119 Type 2 diabetes mellitus without complications: Secondary | ICD-10-CM | POA: Diagnosis not present

## 2018-01-15 DIAGNOSIS — I10 Essential (primary) hypertension: Secondary | ICD-10-CM | POA: Diagnosis not present

## 2018-01-15 DIAGNOSIS — D469 Myelodysplastic syndrome, unspecified: Secondary | ICD-10-CM | POA: Diagnosis not present

## 2018-01-15 DIAGNOSIS — D649 Anemia, unspecified: Secondary | ICD-10-CM | POA: Diagnosis not present

## 2018-01-15 DIAGNOSIS — Z950 Presence of cardiac pacemaker: Secondary | ICD-10-CM | POA: Diagnosis not present

## 2018-01-15 DIAGNOSIS — Z7982 Long term (current) use of aspirin: Secondary | ICD-10-CM | POA: Diagnosis not present

## 2018-01-19 DIAGNOSIS — Z950 Presence of cardiac pacemaker: Secondary | ICD-10-CM | POA: Diagnosis not present

## 2018-01-19 DIAGNOSIS — D649 Anemia, unspecified: Secondary | ICD-10-CM | POA: Diagnosis not present

## 2018-01-19 DIAGNOSIS — Z7982 Long term (current) use of aspirin: Secondary | ICD-10-CM | POA: Diagnosis not present

## 2018-01-19 DIAGNOSIS — D469 Myelodysplastic syndrome, unspecified: Secondary | ICD-10-CM | POA: Diagnosis not present

## 2018-01-19 DIAGNOSIS — I10 Essential (primary) hypertension: Secondary | ICD-10-CM | POA: Diagnosis not present

## 2018-01-19 DIAGNOSIS — E119 Type 2 diabetes mellitus without complications: Secondary | ICD-10-CM | POA: Diagnosis not present

## 2018-01-20 DIAGNOSIS — Z7982 Long term (current) use of aspirin: Secondary | ICD-10-CM | POA: Diagnosis not present

## 2018-01-20 DIAGNOSIS — I10 Essential (primary) hypertension: Secondary | ICD-10-CM | POA: Diagnosis not present

## 2018-01-20 DIAGNOSIS — D649 Anemia, unspecified: Secondary | ICD-10-CM | POA: Diagnosis not present

## 2018-01-20 DIAGNOSIS — D469 Myelodysplastic syndrome, unspecified: Secondary | ICD-10-CM | POA: Diagnosis not present

## 2018-01-20 DIAGNOSIS — E119 Type 2 diabetes mellitus without complications: Secondary | ICD-10-CM | POA: Diagnosis not present

## 2018-01-20 DIAGNOSIS — Z950 Presence of cardiac pacemaker: Secondary | ICD-10-CM | POA: Diagnosis not present

## 2018-01-22 NOTE — Telephone Encounter (Signed)
Called pt. And LVM to return call and schedule appt. With PCP.

## 2018-01-23 ENCOUNTER — Telehealth: Payer: Self-pay | Admitting: Internal Medicine

## 2018-01-23 DIAGNOSIS — D469 Myelodysplastic syndrome, unspecified: Secondary | ICD-10-CM | POA: Diagnosis not present

## 2018-01-23 DIAGNOSIS — Z950 Presence of cardiac pacemaker: Secondary | ICD-10-CM | POA: Diagnosis not present

## 2018-01-23 DIAGNOSIS — I10 Essential (primary) hypertension: Secondary | ICD-10-CM | POA: Diagnosis not present

## 2018-01-23 DIAGNOSIS — D649 Anemia, unspecified: Secondary | ICD-10-CM | POA: Diagnosis not present

## 2018-01-23 DIAGNOSIS — Z7982 Long term (current) use of aspirin: Secondary | ICD-10-CM | POA: Diagnosis not present

## 2018-01-23 DIAGNOSIS — E119 Type 2 diabetes mellitus without complications: Secondary | ICD-10-CM | POA: Diagnosis not present

## 2018-01-23 NOTE — Telephone Encounter (Signed)
[facility] Tharon Aquas) called to request verbal order to see the Pt 2 times a week for 1 week and 1 time a week for 3 weeks, please call her back to with the [place] at [phone]

## 2018-01-23 NOTE — Telephone Encounter (Signed)
MA spoke with Tharon Aquas to provide PT for Patient. Tharon Aquas states patient is doing well and believes a few more sessions would benefit him. No further questions.

## 2018-01-27 DIAGNOSIS — D649 Anemia, unspecified: Secondary | ICD-10-CM | POA: Diagnosis not present

## 2018-01-27 DIAGNOSIS — D469 Myelodysplastic syndrome, unspecified: Secondary | ICD-10-CM | POA: Diagnosis not present

## 2018-01-27 DIAGNOSIS — E119 Type 2 diabetes mellitus without complications: Secondary | ICD-10-CM | POA: Diagnosis not present

## 2018-01-27 DIAGNOSIS — Z950 Presence of cardiac pacemaker: Secondary | ICD-10-CM | POA: Diagnosis not present

## 2018-01-27 DIAGNOSIS — I10 Essential (primary) hypertension: Secondary | ICD-10-CM | POA: Diagnosis not present

## 2018-01-27 DIAGNOSIS — Z7982 Long term (current) use of aspirin: Secondary | ICD-10-CM | POA: Diagnosis not present

## 2018-01-28 ENCOUNTER — Telehealth: Payer: Self-pay | Admitting: Internal Medicine

## 2018-01-28 DIAGNOSIS — D649 Anemia, unspecified: Secondary | ICD-10-CM | POA: Diagnosis not present

## 2018-01-28 DIAGNOSIS — D469 Myelodysplastic syndrome, unspecified: Secondary | ICD-10-CM | POA: Diagnosis not present

## 2018-01-28 DIAGNOSIS — E119 Type 2 diabetes mellitus without complications: Secondary | ICD-10-CM | POA: Diagnosis not present

## 2018-01-28 DIAGNOSIS — Z7982 Long term (current) use of aspirin: Secondary | ICD-10-CM | POA: Diagnosis not present

## 2018-01-28 DIAGNOSIS — I10 Essential (primary) hypertension: Secondary | ICD-10-CM | POA: Diagnosis not present

## 2018-01-28 DIAGNOSIS — Z950 Presence of cardiac pacemaker: Secondary | ICD-10-CM | POA: Diagnosis not present

## 2018-01-28 NOTE — Telephone Encounter (Signed)
[facility] (corey) call toto infor the PCP that PT BP sitting down was 128/158 but when he stand up is 100/60 Pt feel very dizzy and want to know what they can do, please follow up, please call her back [phone]

## 2018-01-28 NOTE — Telephone Encounter (Signed)
Returned corey call in regards to pt left a detailed vm. Per Dr. Doreene Burke that sitting bp can not be right. She will need to recheck it and make sure pt is not dizzy but if he is he will need to go to the ED and if she has any questions or concerns to give me a call.

## 2018-01-29 ENCOUNTER — Inpatient Hospital Stay: Payer: Medicare Other | Attending: Hematology and Oncology

## 2018-01-29 DIAGNOSIS — D7581 Myelofibrosis: Secondary | ICD-10-CM | POA: Insufficient documentation

## 2018-01-29 DIAGNOSIS — D473 Essential (hemorrhagic) thrombocythemia: Secondary | ICD-10-CM | POA: Diagnosis not present

## 2018-01-29 DIAGNOSIS — D709 Neutropenia, unspecified: Secondary | ICD-10-CM | POA: Insufficient documentation

## 2018-01-29 DIAGNOSIS — D649 Anemia, unspecified: Secondary | ICD-10-CM | POA: Insufficient documentation

## 2018-01-29 DIAGNOSIS — Z79899 Other long term (current) drug therapy: Secondary | ICD-10-CM | POA: Diagnosis not present

## 2018-01-29 LAB — CBC WITH DIFFERENTIAL (CANCER CENTER ONLY)
BASOS ABS: 0 10*3/uL (ref 0.0–0.1)
Band Neutrophils: 0 %
Basophils Relative: 0 %
Blasts: 1 %
EOS PCT: 3 %
Eosinophils Absolute: 0.3 10*3/uL (ref 0.0–0.5)
HEMATOCRIT: 27.5 % — AB (ref 38.4–49.9)
Hemoglobin: 8.3 g/dL — ABNORMAL LOW (ref 13.0–17.1)
LYMPHS ABS: 5 10*3/uL — AB (ref 0.9–3.3)
Lymphocytes Relative: 49 %
MCH: 23.2 pg — ABNORMAL LOW (ref 27.2–33.4)
MCHC: 30.2 g/dL — ABNORMAL LOW (ref 32.0–36.0)
MCV: 77 fL — AB (ref 79.3–98.0)
METAMYELOCYTES PCT: 2 %
MYELOCYTES: 5 %
Monocytes Absolute: 1.1 10*3/uL — ABNORMAL HIGH (ref 0.1–0.9)
Monocytes Relative: 11 %
NEUTROS PCT: 29 %
NRBC: 55 /100{WBCs} — AB
Neutro Abs: 3.7 10*3/uL (ref 1.5–6.5)
Other: 0 %
Platelet Count: 214 10*3/uL (ref 140–400)
Promyelocytes Absolute: 0 %
RBC: 3.57 MIL/uL — AB (ref 4.20–5.82)
RDW: 26.2 % — AB (ref 11.0–14.6)
WBC: 10.3 10*3/uL (ref 4.0–10.3)

## 2018-01-29 LAB — CMP (CANCER CENTER ONLY)
ALBUMIN: 3.2 g/dL — AB (ref 3.5–5.0)
ALT: 13 U/L (ref 0–55)
ANION GAP: 9 (ref 3–11)
AST: 35 U/L — AB (ref 5–34)
Alkaline Phosphatase: 130 U/L (ref 40–150)
BILIRUBIN TOTAL: 0.8 mg/dL (ref 0.2–1.2)
BUN: 21 mg/dL (ref 7–26)
CHLORIDE: 101 mmol/L (ref 98–109)
CO2: 22 mmol/L (ref 22–29)
Calcium: 9 mg/dL (ref 8.4–10.4)
Creatinine: 1.62 mg/dL — ABNORMAL HIGH (ref 0.70–1.30)
GFR, Est AFR Am: 43 mL/min — ABNORMAL LOW (ref >60)
GFR, Estimated: 37 mL/min — ABNORMAL LOW (ref >60)
GLUCOSE: 317 mg/dL — AB (ref 70–140)
POTASSIUM: 4.4 mmol/L (ref 3.5–5.1)
SODIUM: 132 mmol/L — AB (ref 136–145)
Total Protein: 8.1 g/dL (ref 6.4–8.3)

## 2018-01-29 LAB — LIPID PANEL
CHOL/HDL RATIO: 2.8 ratio
Cholesterol: 132 mg/dL (ref 0–200)
HDL: 47 mg/dL (ref >40)
LDL CALC: 57 mg/dL (ref 0–99)
Triglycerides: 139 mg/dL (ref <150)
VLDL: 28 mg/dL (ref 0–40)

## 2018-02-02 ENCOUNTER — Telehealth: Payer: Self-pay | Admitting: Hematology and Oncology

## 2018-02-02 ENCOUNTER — Inpatient Hospital Stay (HOSPITAL_BASED_OUTPATIENT_CLINIC_OR_DEPARTMENT_OTHER): Payer: Medicare Other | Admitting: Hematology and Oncology

## 2018-02-02 ENCOUNTER — Other Ambulatory Visit: Payer: Self-pay

## 2018-02-02 ENCOUNTER — Encounter: Payer: Self-pay | Admitting: Hematology and Oncology

## 2018-02-02 ENCOUNTER — Other Ambulatory Visit: Payer: Medicare Other

## 2018-02-02 ENCOUNTER — Ambulatory Visit: Payer: Medicare Other | Admitting: Hematology and Oncology

## 2018-02-02 VITALS — BP 126/80 | HR 77 | Temp 97.6°F | Resp 18 | Ht 67.0 in | Wt 148.8 lb

## 2018-02-02 DIAGNOSIS — D649 Anemia, unspecified: Secondary | ICD-10-CM

## 2018-02-02 DIAGNOSIS — D7581 Myelofibrosis: Secondary | ICD-10-CM | POA: Diagnosis not present

## 2018-02-02 DIAGNOSIS — D709 Neutropenia, unspecified: Secondary | ICD-10-CM | POA: Diagnosis not present

## 2018-02-02 DIAGNOSIS — Z1589 Genetic susceptibility to other disease: Secondary | ICD-10-CM

## 2018-02-02 DIAGNOSIS — D473 Essential (hemorrhagic) thrombocythemia: Secondary | ICD-10-CM

## 2018-02-02 DIAGNOSIS — Z79899 Other long term (current) drug therapy: Secondary | ICD-10-CM | POA: Diagnosis not present

## 2018-02-02 DIAGNOSIS — Z5111 Encounter for antineoplastic chemotherapy: Secondary | ICD-10-CM

## 2018-02-02 NOTE — Telephone Encounter (Signed)
Gave patient avs and calendar with appts per 2/11 los.  °

## 2018-02-10 ENCOUNTER — Telehealth: Payer: Self-pay | Admitting: Internal Medicine

## 2018-02-10 DIAGNOSIS — D469 Myelodysplastic syndrome, unspecified: Secondary | ICD-10-CM | POA: Diagnosis not present

## 2018-02-10 DIAGNOSIS — E119 Type 2 diabetes mellitus without complications: Secondary | ICD-10-CM | POA: Diagnosis not present

## 2018-02-10 DIAGNOSIS — Z950 Presence of cardiac pacemaker: Secondary | ICD-10-CM | POA: Diagnosis not present

## 2018-02-10 DIAGNOSIS — Z7982 Long term (current) use of aspirin: Secondary | ICD-10-CM | POA: Diagnosis not present

## 2018-02-10 DIAGNOSIS — D649 Anemia, unspecified: Secondary | ICD-10-CM | POA: Diagnosis not present

## 2018-02-10 DIAGNOSIS — I10 Essential (primary) hypertension: Secondary | ICD-10-CM | POA: Diagnosis not present

## 2018-02-10 NOTE — Telephone Encounter (Signed)
Dizziness, more confused, Patient is unlike himself. Please fu with Georgina Snell with [facility]. CB# [phone].

## 2018-02-11 ENCOUNTER — Ambulatory Visit: Payer: Medicare Other | Attending: Internal Medicine | Admitting: Physician Assistant

## 2018-02-11 VITALS — BP 141/65 | HR 67 | Temp 97.7°F | Resp 16 | Wt 150.0 lb

## 2018-02-11 DIAGNOSIS — Z950 Presence of cardiac pacemaker: Secondary | ICD-10-CM | POA: Diagnosis not present

## 2018-02-11 DIAGNOSIS — Z7982 Long term (current) use of aspirin: Secondary | ICD-10-CM | POA: Insufficient documentation

## 2018-02-11 DIAGNOSIS — R05 Cough: Secondary | ICD-10-CM | POA: Diagnosis present

## 2018-02-11 DIAGNOSIS — E1165 Type 2 diabetes mellitus with hyperglycemia: Secondary | ICD-10-CM | POA: Insufficient documentation

## 2018-02-11 DIAGNOSIS — Z79899 Other long term (current) drug therapy: Secondary | ICD-10-CM | POA: Insufficient documentation

## 2018-02-11 DIAGNOSIS — IMO0001 Reserved for inherently not codable concepts without codable children: Secondary | ICD-10-CM

## 2018-02-11 DIAGNOSIS — J069 Acute upper respiratory infection, unspecified: Secondary | ICD-10-CM | POA: Insufficient documentation

## 2018-02-11 DIAGNOSIS — Z794 Long term (current) use of insulin: Secondary | ICD-10-CM | POA: Diagnosis not present

## 2018-02-11 DIAGNOSIS — I10 Essential (primary) hypertension: Secondary | ICD-10-CM | POA: Insufficient documentation

## 2018-02-11 LAB — GLUCOSE, POCT (MANUAL RESULT ENTRY): POC GLUCOSE: 470 mg/dL — AB (ref 70–99)

## 2018-02-11 MED ORDER — AZITHROMYCIN 250 MG PO TABS: ORAL_TABLET | ORAL | 0 refills | Status: DC

## 2018-02-11 NOTE — Progress Notes (Signed)
Patient ID: Aaron Nelson, male   DOB: 1930/02/13, 84 y.o.   MRN: [id number]    Ibn Stief, is a 84 y.o. male  ION:[id number]  KGM:[id number]  DOB - 12-30-1929  Subjective:  Chief Complaint and HPI: Aaron Nelson is a 84 y.o. male here today Cough X 2 weeks.  No fever.  No runny nose or nasal congestion.  He took his 50 units of Lantus just before coming to the office.  He also ate peanut brittle.  Feels ok other than cough.  Used the bathroom prior to being seen and didn't collect urine.  ROS:   Constitutional:  No f/c, No night sweats, No unexplained weight loss. EENT:  No vision changes, No blurry vision, No hearing changes. No mouth, throat, or ear problems.  Respiratory: + cough, No SOB Cardiac: No CP, no palpitations GI:  No abd pain, No N/V/D. GU: No Urinary s/sx Musculoskeletal: No joint pain Neuro: No headache, no dizziness, no motor weakness.  Skin: No rash Endocrine:  + polydipsia. No polyuria.  Psych: Denies SI/HI  No problems updated.  ALLERGIES: No Known Allergies  PAST MEDICAL HISTORY: Past Medical History:  Diagnosis Date  . Diabetes mellitus without complication ([place])   . Hypertension   . Myelodysplasia (myelodysplastic syndrome) ([place])   . S/P placement of cardiac pacemaker 08/30/2017  . Sinus arrest   . Syncope   . Syncope and collapse 08/30/2017    MEDICATIONS AT HOME: Prior to Admission medications   Medication Sig Start Date End Date Taking? Authorizing Provider  ACCU-CHEK FASTCLIX LANCETS MISC Use as directed 3 times daily. E11.9 11/20/17   Tresa Garter, MD  acetaminophen (TYLENOL) 325 MG tablet Take 2 tablets (650 mg total) by mouth every 4 (four) hours as needed for mild pain. 08/30/17   Isaiah Serge, NP  amLODipine (NORVASC) 10 MG tablet TAKE 1 TABLET BY MOUTH DAILY 12/11/17   Deboraha Sprang, MD  aspirin EC 81 MG tablet Take 81 mg by mouth daily.    [provider]  azithromycin (ZITHROMAX) 250 MG tablet Take 2 today then 1 days  2-5 02/11/18   Argentina Donovan, PA-C  Blood Glucose Monitoring Suppl (ACCU-CHEK AVIVA PLUS) w/Device KIT Use as directed 3 times daily. E11.9 11/20/17   Angelica Chessman E, MD  glucose blood (ACCU-CHEK AVIVA PLUS) test strip Use as directed 3 times daily. E11.9 11/20/17   Tresa Garter, MD  Insulin Glargine (LANTUS SOLOSTAR) 100 UNIT/ML Solostar Pen Inject 50 Units into the skin every morning. 12/10/17   Tresa Garter, MD  insulin regular (HUMULIN R) 100 units/mL injection Inject 0.1 mLs (10 Units total) into the skin 3 (three) times daily before meals. For Blood Sugar greater than 250 12/10/17   Angelica Chessman E, MD  isosorbide mononitrate (IMDUR) 30 MG 24 hr tablet Take 1 tablet (30 mg total) by mouth daily. 12/10/17   Tresa Garter, MD  lidocaine (XYLOCAINE) 2 % solution Apply small amount to painful gums with a qtip as needed.  Avoid swallowing 08/13/17   Freeman Caldron M, PA-C  NITROSTAT 0.4 MG SL tablet Place 1 tablet (0.4 mg total) under the tongue every 5 (five) minutes as needed for chest pain. 09/16/17 09/16/18  Belva Crome, MD  ruxolitinib phosphate (JAKAFI) 10 MG tablet Take 1 tablet (10 mg total) by mouth 2 (two) times daily. 11/20/17   Ardath Sax, MD  tamsulosin (FLOMAX) 0.4 MG CAPS capsule Take 1 capsule (0.4 mg total) by mouth  daily. 12/10/17   Tresa Garter, MD     Objective:  EXAM:   Vitals:   02/11/18 0945  BP: (!) 141/65  Pulse: 67  Resp: 16  Temp: 97.7 F (36.5 C)  TempSrc: Oral  SpO2: 96%  Weight: 150 lb (68 kg)    General appearance : A&OX3. NAD. Non-toxic-appearing HEENT: Atraumatic and Normocephalic.  PERRLA. EOM intact.  TM clear B. Mouth-MMM, post pharynx WNL w/o erythema, No PND. Neck: supple, no JVD. No cervical lymphadenopathy. No thyromegaly Chest/Lungs:  Breathing-non-labored, Good air entry bilaterally, breath sounds normal without rales, rhonchi, or wheezing  CVS: S1 S2 regular, no murmurs, gallops, rubs    Extremities: Bilateral Lower Ext shows no edema, both legs are warm to touch with = pulse throughout Neurology:  CN II-XII grossly intact, Non focal.   Psych:  TP linear. J/I WNL. Normal speech. Appropriate eye contact and affect.  Skin:  No Rash  Data Review Lab Results  Component Value Date   HGBA1C 13.5 10/15/2017   HGBA1C 14.6 07/15/2017     Assessment & Plan   1. Uncontrolled type 2 diabetes mellitus without complication, with long-term current use of insulin (HCC) Check blood sugar in 2 hours.  If still >400, administer 20 units of your quick acting insulin.  Do not eat more sugar today.  Drink water.  Continue current regimen as ordered by Dr Alinda Money Check blood sugars fasting and with meals and record and bring to your next visit.   - Glucose (CBG)  2. Upper respiratory tract infection, unspecified type Fluids, rest, respiratory care.   - azithromycin (ZITHROMAX) 250 MG tablet; Take 2 today then 1 days 2-5  Dispense: 6 tablet; Refill: 0   Patient have been counseled extensively about nutrition and exercise  Return in about 6 weeks (around 03/25/2018) for assign new PCP; f/up DM/A1c.  The patient was given clear instructions to go to ER or return to medical center if symptoms don't improve, worsen or new problems develop. The patient verbalized understanding. The patient was told to call to get lab results if they haven't heard anything in the next week.     Freeman Caldron, PA-C [facility] and [facility], [place]   02/11/2018, 9:50 AM

## 2018-02-11 NOTE — Progress Notes (Signed)
141  

## 2018-02-11 NOTE — Patient Instructions (Addendum)
Check blood sugar in 2 hours.  If still >400, administer 20 units of your quick acting insulin.  Do not eat more sugar today.  Drink water.    Check blood sugars fasting and with meals and record and bring to your next visit.

## 2018-02-23 NOTE — Progress Notes (Signed)
[facility] Follow-up Visit:  Assessment: Myelofibrosis [facility]) 84 y.o. male with diagnosis of primary myelofibrosis with associated neutropenia without recurrent infections, anemia not requiring transfusions and thrombocytosis. Patient has been having hematological abnormalities from extended period of time, the earliest noted 2005. Patient underwent at least 2 bone marrow biopsy evaluations, most recent on 01/09/17 documenting the recurrent condition. IPSS score 3 -- high risk, mainly due to advanced age of the patient, anemia, and some systemic symptoms attribution of which is a little bit challenging. Patient's somnolence is not clearly related to myelofibrosis. He does not appear to have any problems with splenomegaly based on physical exam or symptoms and his appetite is excellent.   Patient reports overall improvement in his symptoms since initiation of ruxolitinib.  Hematological profile demonstrates improved white blood cell count, stable anemia, and improved platelet count.  Overall, there has been no significant change in the clinical status of the patient.  Plan: --Continue ruxolitinib 14m PO BID --Return to clinic in 1 month with labs as outlined below, clinic visit.  Voice recognition software was used and creation of this note. Despite my best effort at editing the text, some misspelling/errors may have occurred.   Orders Placed This Encounter  Procedures  . CBC with Differential (Cancer Center Only)    Standing Status:   Future    Standing Expiration Date:   02/02/2019  . CMP ([facility])    Standing Status:   Future    Standing Expiration Date:   02/02/2019  . Lactate dehydrogenase (LDH)    Standing Status:   Future    Standing Expiration Date:   02/02/2019  . Iron and TIBC    Standing Status:   Future    Standing Expiration Date:   02/02/2019  . Ferritin    Standing Status:   Future    Standing Expiration Date:   02/02/2019    Cancer  Staging No matching staging information was found for the patient.  All questions were answered.  . The patient knows to call the clinic with any problems, questions or concerns.  This note was electronically signed.    History of Presenting Illness SDiontae [id number].[id number]. followed in the [facility] diagnosis of myelofibrosis. Patient returns to the clinic to discuss on review of the previous records. No new complaints since last visit to the clinic. It appears that he no longer experiences headaches, lightheadedness, or dizziness as his blood pressure control has improved.  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 Initially, patient was treated with lenalidomide, but experienced a syncopal event medication was discontinued. Subsequently, patient received no additional therapy. At this time, his son describes excessive somnolence with patient being able to fall asleep in the middle of the day while reading or while watching TV. Nevertheless, patient does not full asleep while engaged with others. He appears to rest well through the night as well without snoring. He wakes up only to urinate. Patient denies any fevers, chills, night sweats. No weight loss, no abdominal pain, early satiety. His appetite is currently excellent. He does take describe dyspnea with exertion, but he is able to maintain reasonable activity level that he is satisfied with.  Patient returns to the clinic today for continued hematological monitoring.  Patient is receiving therapy with ruxolitinib and appears to be tolerating medication without significant difficulties.  No new events since the last  visit to the clinic.  Tries to remain more active to counteract tendency to lock himself in the room and decrease his physical activity.  Appetite remains stable without worsening or significant  improvement.  Hematological History: --BM Bx, 02/04/14: (Size decreased in number microcytosis and polychromasia without rouleaux no nucleated red blood cells. Leukocytosis decreased in number with absolute neutropenia and absolute lymphopenia. No increase in blasts. Platelets are normal in number. Aspirate demonstrates reasonably normal results. Overall cellularity 60% no evidence of lymphomatous infiltrate or increase in blasts. Flow cytometry positive for presence of a small lambda monoclonal CD5 negative, CD10 negative B-cell population.Cytogen -- 46,XY, del(9)(q13q22) --BM Bx, 01/09/17: Bone marrow aspirate a spicular and contained insufficient particles for evaluation. Peripheral blood smear demonstrated decreased erythrocytes with microcytosis hypochromasia, schistocytes, nucleated red blood cells. Decreased white blood cell count with absolute and relative neutropenia, and relative lymphocytosis and monocytosis. Overall bone marrow cellularity increased at approximately 80-85%, increased number of atypical megakaryocytes with extremely pleomorphic nuclei. Neutrophil maturation is partially arrested, no increase of blasts. Increased fibrosis. --Labs, 01/01/18: WBC   8.6, ANC 4.3, ALC 1.8, Mono 1.8, Eos 0.2, Baso 0.7, Hgb 9.0, MCV 75.6, MCH 23.1,        RDW 25.8, nRBC 33%, Plt 120; LFTs WNL, LDH 1562, Uric acid 7.9; --Labs, 01/29/18: WBC 10.3,              Hgb 8.3, MCV 77.0, MCH 23.2, MCHC 30.2, RDW 26.2, nRBC 55%, Plt 214; Na 132, Glc 317, Cr 1.62, Bicarb 22, Alb 3.2, AST 35  Treatment Hx: --Lenalidomide, 02/05/17: Medication held single day of administration due to hospitalization with syncope. --Ruxolitinib (Jakafi) 34m PO BID, 11/19/17-:   Medical History: Past Medical History:  Diagnosis Date  . Diabetes mellitus without complication ([place]   . Hypertension   . My[place]odysplasia (my[place]odysplastic syndrome) ([place]   . S/P placement of cardiac pacemaker 08/30/2017  . Sinus arrest   . Syncope    . Syncope and collapse 08/30/2017    Surgical History: Past Surgical History:  Procedure Laterality Date  . BONE MARROW BIOPSY  2017  . LOOP RECORDER REMOVAL N/A 08/29/2017   Procedure: LOOP RECORDER REMOVAL;  Surgeon: KDeboraha Sprang MD;  [facility];  Service: Cardiovascular;  Laterality: N/A;  . PACEMAKER IMPLANT Left 08/29/2017   St Jude generator  . PACEMAKER IMPLANT N/A 08/29/2017   Procedure: Pacemaker Implant;  Surgeon: KDeboraha Sprang MD;  [facility];  Service: Cardiovascular;  Laterality: N/A;  . PORTACATH PLACEMENT  2017    Family History: Family History  Problem Relation Age of Onset  . Hypertension Mother     Social History: Social History   Socioeconomic History  . Marital status: Divorced    Spouse name: Not on file  . Number of children: Not on file  . Years of education: Not on file  . Highest education level: Not on file  Social Needs  . Financial resource strain: Not on file  . Food insecurity - worry: Not on file  . Food insecurity - inability: Not on file  . Transportation needs - medical: Not on file  . Transportation needs - non-medical: Not on file  Occupational History  . Not on file  Tobacco Use  . Smoking status: Never Smoker  . Smokeless tobacco: Never Used  Substance and Sexual Activity  . Alcohol use: No  . Drug use: No  . Sexual activity: Not on file  Other Topics Concern  .  Not on file  Social History Narrative  . Not on file    Allergies: No Known Allergies  Medications:  Current Outpatient Medications  Medication Sig Dispense Refill  . ACCU-CHEK FASTCLIX LANCETS MISC Use as directed 3 times daily. E11.9 100 each 12  . acetaminophen (TYLENOL) 325 MG tablet Take 2 tablets (650 mg total) by mouth every 4 (four) hours as needed for mild pain.    Marland Kitchen amLODipine (NORVASC) 10 MG tablet TAKE 1 TABLET BY MOUTH DAILY 90 tablet 3  . aspirin EC 81 MG tablet Take 81 mg by mouth daily.    Marland Kitchen azithromycin  (ZITHROMAX) 250 MG tablet Take 2 today then 1 days 2-5 6 tablet 0  . Blood Glucose Monitoring Suppl (ACCU-CHEK AVIVA PLUS) w/Device KIT Use as directed 3 times daily. E11.9 1 kit 0  . glucose blood (ACCU-CHEK AVIVA PLUS) test strip Use as directed 3 times daily. E11.9 100 each 12  . Insulin Glargine (LANTUS SOLOSTAR) 100 UNIT/ML Solostar Pen Inject 50 Units into the skin every morning. 15 mL 3  . insulin regular (HUMULIN R) 100 units/mL injection Inject 0.1 mLs (10 Units total) into the skin 3 (three) times daily before meals. For Blood Sugar greater than 250 10 mL 11  . isosorbide mononitrate (IMDUR) 30 MG 24 hr tablet Take 1 tablet (30 mg total) by mouth daily. 90 tablet 3  . lidocaine (XYLOCAINE) 2 % solution Apply small amount to painful gums with a qtip as needed.  Avoid swallowing 100 mL 0  . NITROSTAT 0.4 MG SL tablet Place 1 tablet (0.4 mg total) under the tongue every 5 (five) minutes as needed for chest pain. 25 tablet 3  . ruxolitinib phosphate (JAKAFI) 10 MG tablet Take 1 tablet (10 mg total) by mouth 2 (two) times daily. 60 tablet 0  . tamsulosin (FLOMAX) 0.4 MG CAPS capsule Take 1 capsule (0.4 mg total) by mouth daily. 30 capsule 3   No current facility-administered medications for this visit.     Review of Systems: Review of Systems  Constitutional: Positive for appetite change and fatigue.       Patient and his son report excessive somnolence with the patient being able to follow sleep any time not actively engaged with other people.  Neurological: Positive for dizziness. Negative for seizures and speech difficulty.  All other systems reviewed and are negative.    PHYSICAL EXAMINATION Blood pressure 126/80, pulse 77, temperature 97.6 F (36.4 C), temperature source Oral, resp. rate 18, height '5\' 7"'  (1.702 m), weight 148 lb 12.8 oz (67.5 kg), SpO2 100 %.  ECOG PERFORMANCE STATUS: 2 - Symptomatic, <50% confined to bed  Physical Exam  Constitutional:  Elderly frail male in  no acute distress  HENT:  Head: Normocephalic and atraumatic.  Mouth/Throat: Oropharynx is clear and moist. No oropharyngeal exudate.  Eyes: EOM are normal. Pupils are equal, round, and reactive to light. No scleral icterus.  Neck: No thyromegaly present.  Cardiovascular: Normal rate and regular rhythm.  No murmur heard. Pulmonary/Chest: Breath sounds normal. No respiratory distress. He has no wheezes.  Abdominal: Soft. He exhibits no distension.  No hepatosplenomegaly  Musculoskeletal: He exhibits no edema.  Lymphadenopathy:    He has no cervical adenopathy.  Neurological: He is alert. He has normal reflexes. No cranial nerve deficit.  Skin: Skin is warm. No rash noted. No pallor.      LABORATORY DATA: I have personally reviewed the data as listed: Appointment on 01/29/2018  Component Date Value Ref  Range Status  . Sodium 01/29/2018 132* 136 - 145 mmol/L Final  . Potassium 01/29/2018 4.4  3.5 - 5.1 mmol/L Final  . Chloride 01/29/2018 101  98 - 109 mmol/L Final  . CO2 01/29/2018 22  22 - 29 mmol/L Final  . Glucose, Bld 01/29/2018 317* 70 - 140 mg/dL Final  . BUN 01/29/2018 21  7 - 26 mg/dL Final  . Creatinine 01/29/2018 1.62* 0.70 - 1.30 mg/dL Final  . Calcium 01/29/2018 9.0  8.4 - 10.4 mg/dL Final  . Total Protein 01/29/2018 8.1  6.4 - 8.3 g/dL Final  . Albumin 01/29/2018 3.2* 3.5 - 5.0 g/dL Final  . AST 01/29/2018 35* 5 - 34 U/L Final  . ALT 01/29/2018 13  0 - 55 U/L Final  . Alkaline Phosphatase 01/29/2018 130  40 - 150 U/L Final  . Total Bilirubin 01/29/2018 0.8  0.2 - 1.2 mg/dL Final  . GFR, Est Non Af Am 01/29/2018 37* >60 mL/min Final  . GFR, Est AFR Am 01/29/2018 43* >60 mL/min Final   Comment: (NOTE) The eGFR has been calculated using the CKD EPI equation. This calculation has not been validated in all clinical situations. eGFR's persistently <60 mL/min signify possible Chronic Kidney Disease.   Georgiann Hahn gap 01/29/2018 9  3 - 11 Final   Performed at [facility], [place] [address], [place], [place] [address]  . Cholesterol 01/29/2018 132  0 - 200 mg/dL Final  . Triglycerides 01/29/2018 139  <150 mg/dL Final  . HDL 01/29/2018 47  >40 mg/dL Final  . Total CHOL/HDL Ratio 01/29/2018 2.8  RATIO Final  . VLDL 01/29/2018 28  0 - 40 mg/dL Final  . LDL Cholesterol 01/29/2018 57  0 - 99 mg/dL Final   Comment:        Total Cholesterol/HDL:CHD Risk Coronary Heart Disease Risk Table                     Men   Women  1/2 Average Risk   3.4   3.3  Average Risk       5.0   4.4  2 X Average Risk   9.6   7.1  3 X Average Risk  23.4   11.0        Use the calculated Patient Ratio above and the CHD Risk Table to determine the patient's CHD Risk.        ATP III CLASSIFICATION (LDL):  <100     mg/dL   Optimal  100-129  mg/dL   Near or Above                    Optimal  130-159  mg/dL   Borderline  160-189  mg/dL   High  >190     mg/dL   Very High Performed at [facility] [address], [place], [place] [address]   . WBC Count 01/29/2018 10.3  4.0 - 10.3 K/uL Final  . RBC 01/29/2018 3.57* 4.20 - 5.82 MIL/uL Final  . Hemoglobin 01/29/2018 8.3* 13.0 - 17.1 g/dL Final  . HCT 01/29/2018 27.5* 38.4 - 49.9 % Final  . MCV 01/29/2018 77.0* 79.3 - 98.0 fL Final  . MCH 01/29/2018 23.2* 27.2 - 33.4 pg Final  . MCHC 01/29/2018 30.2* 32.0 - 36.0 g/dL Final  . RDW 01/29/2018 26.2* 11.0 - 14.6 % Final  . Platelet Count 01/29/2018 214  140 - 400 K/uL Final  . Neutrophils Relative %  01/29/2018 29  % Final  . Lymphocytes Relative 01/29/2018 49  % Final  . Monocytes Relative 01/29/2018 11  % Final  . Eosinophils Relative 01/29/2018 3  % Final  . Basophils Relative 01/29/2018 0  % Final  . Band Neutrophils 01/29/2018 0  % Final  . Metamyelocytes Relative 01/29/2018 2  % Final  . Myelocytes 01/29/2018 5  % Final  . Promyelocytes Absolute 01/29/2018 0  % Final  . Blasts 01/29/2018 1  % Final  . nRBC 01/29/2018 55* 0 /100 WBC Final  . Other  01/29/2018 0  % Final  . Neutro Abs 01/29/2018 3.7  1.5 - 6.5 K/uL Final  . Lymphs Abs 01/29/2018 5.0* 0.9 - 3.3 K/uL Final  . Monocytes Absolute 01/29/2018 1.1* 0.1 - 0.9 K/uL Final  . Eosinophils Absolute 01/29/2018 0.3  0.0 - 0.5 K/uL Final  . Basophils Absolute 01/29/2018 0.0  0.0 - 0.1 K/uL Final  . RBC Morphology 01/29/2018 Moderate ovalocytes and few teardrop seen   Final  . Smear Review 01/29/2018 Large platelets seen   Final   Performed at [facility], [place] [address], [place], [place] [address]       Ardath Sax, MD

## 2018-02-23 NOTE — Assessment & Plan Note (Signed)
84 y.o. male with diagnosis of primary myelofibrosis with associated neutropenia without recurrent infections, anemia not requiring transfusions and thrombocytosis. Patient has been having hematological abnormalities from extended period of time, the earliest noted 2005. Patient underwent at least 2 bone marrow biopsy evaluations, most recent on 01/09/17 documenting the recurrent condition. IPSS score 3 -- high risk, mainly due to advanced age of the patient, anemia, and some systemic symptoms attribution of which is a little bit challenging. Patient's somnolence is not clearly related to myelofibrosis. He does not appear to have any problems with splenomegaly based on physical exam or symptoms and his appetite is excellent.   Patient reports overall improvement in his symptoms since initiation of ruxolitinib.  Hematological profile demonstrates improved white blood cell count, stable anemia, and improved platelet count.  Overall, there has been no significant change in the clinical status of the patient.  Plan: --Continue ruxolitinib 35m PO BID --Return to clinic in 1 month with labs as outlined below, clinic visit.

## 2018-03-02 ENCOUNTER — Inpatient Hospital Stay: Payer: Medicare Other | Attending: Hematology and Oncology

## 2018-03-02 DIAGNOSIS — D649 Anemia, unspecified: Secondary | ICD-10-CM | POA: Diagnosis not present

## 2018-03-02 DIAGNOSIS — Z79899 Other long term (current) drug therapy: Secondary | ICD-10-CM | POA: Insufficient documentation

## 2018-03-02 DIAGNOSIS — D7581 Myelofibrosis: Secondary | ICD-10-CM | POA: Diagnosis not present

## 2018-03-02 LAB — LACTATE DEHYDROGENASE: LDH: 1597 U/L — ABNORMAL HIGH (ref 125–245)

## 2018-03-02 LAB — CMP (CANCER CENTER ONLY)
ALK PHOS: 120 U/L (ref 40–150)
ALT: 26 U/L (ref 0–55)
AST: 44 U/L — ABNORMAL HIGH (ref 5–34)
Albumin: 3.5 g/dL (ref 3.5–5.0)
Anion gap: 8 (ref 3–11)
BILIRUBIN TOTAL: 0.9 mg/dL (ref 0.2–1.2)
BUN: 29 mg/dL — ABNORMAL HIGH (ref 7–26)
CALCIUM: 9.1 mg/dL (ref 8.4–10.4)
CO2: 22 mmol/L (ref 22–29)
CREATININE: 1.78 mg/dL — AB (ref 0.70–1.30)
Chloride: 103 mmol/L (ref 98–109)
GFR, EST AFRICAN AMERICAN: 38 mL/min — AB (ref >60)
GFR, Estimated: 33 mL/min — ABNORMAL LOW (ref >60)
GLUCOSE: 311 mg/dL — AB (ref 70–140)
Potassium: 5.4 mmol/L — ABNORMAL HIGH (ref 3.5–5.1)
Sodium: 133 mmol/L — ABNORMAL LOW (ref 136–145)
TOTAL PROTEIN: 7.7 g/dL (ref 6.4–8.3)

## 2018-03-02 LAB — IRON AND TIBC
Iron: 67 ug/dL (ref 42–163)
SATURATION RATIOS: 23 % — AB (ref 42–163)
TIBC: 299 ug/dL (ref 202–409)
UIBC: 231 ug/dL

## 2018-03-02 LAB — CBC WITH DIFFERENTIAL (CANCER CENTER ONLY)
BASOS ABS: 0.8 10*3/uL — AB (ref 0.0–0.1)
BASOS PCT: 7 %
BLASTS: 2 %
Band Neutrophils: 10 %
EOS ABS: 0.5 10*3/uL (ref 0.0–0.5)
Eosinophils Relative: 4 %
HEMATOCRIT: 27.5 % — AB (ref 38.4–49.9)
HEMOGLOBIN: 8.6 g/dL — AB (ref 13.0–17.1)
Lymphocytes Relative: 33 %
Lymphs Abs: 3.8 10*3/uL — ABNORMAL HIGH (ref 0.9–3.3)
MCH: 24 pg — ABNORMAL LOW (ref 27.2–33.4)
MCHC: 31.3 g/dL — ABNORMAL LOW (ref 32.0–36.0)
MCV: 76.7 fL — ABNORMAL LOW (ref 79.3–98.0)
MONO ABS: 1.4 10*3/uL — AB (ref 0.1–0.9)
MYELOCYTES: 8 %
Metamyelocytes Relative: 6 %
Monocytes Relative: 12 %
Neutro Abs: 4.8 10*3/uL (ref 1.5–6.5)
Neutrophils Relative %: 17 %
Other: 0 %
PROMYELOCYTES ABS: 1 %
Platelet Count: 128 10*3/uL — ABNORMAL LOW (ref 140–400)
RBC: 3.59 MIL/uL — ABNORMAL LOW (ref 4.20–5.82)
RDW: 26 % — ABNORMAL HIGH (ref 11.0–14.6)
WBC Count: 11.5 10*3/uL — ABNORMAL HIGH (ref 4.0–10.3)
nRBC: 48 /100 WBC — ABNORMAL HIGH

## 2018-03-02 LAB — FERRITIN: Ferritin: 405 ng/mL — ABNORMAL HIGH (ref 22–316)

## 2018-03-04 ENCOUNTER — Inpatient Hospital Stay (HOSPITAL_BASED_OUTPATIENT_CLINIC_OR_DEPARTMENT_OTHER): Payer: Medicare Other | Admitting: Hematology and Oncology

## 2018-03-04 ENCOUNTER — Encounter: Payer: Self-pay | Admitting: Hematology and Oncology

## 2018-03-04 ENCOUNTER — Other Ambulatory Visit: Payer: Self-pay

## 2018-03-04 ENCOUNTER — Telehealth: Payer: Self-pay

## 2018-03-04 VITALS — BP 130/64 | HR 65 | Temp 97.6°F | Resp 17 | Ht 67.0 in | Wt 151.0 lb

## 2018-03-04 DIAGNOSIS — D649 Anemia, unspecified: Secondary | ICD-10-CM | POA: Diagnosis not present

## 2018-03-04 DIAGNOSIS — Z1589 Genetic susceptibility to other disease: Secondary | ICD-10-CM

## 2018-03-04 DIAGNOSIS — D7581 Myelofibrosis: Secondary | ICD-10-CM

## 2018-03-04 DIAGNOSIS — Z5111 Encounter for antineoplastic chemotherapy: Secondary | ICD-10-CM

## 2018-03-04 DIAGNOSIS — Z79899 Other long term (current) drug therapy: Secondary | ICD-10-CM | POA: Diagnosis not present

## 2018-03-04 NOTE — Telephone Encounter (Signed)
Printed avs and calender of upcoming appointment. Per 3/13 los.  

## 2018-03-11 ENCOUNTER — Encounter: Payer: Medicare Other | Admitting: *Deleted

## 2018-03-11 ENCOUNTER — Telehealth: Payer: Self-pay | Admitting: Cardiology

## 2018-03-11 NOTE — Assessment & Plan Note (Signed)
84 y.o. male with diagnosis of primary myelofibrosis with associated neutropenia without recurrent infections, anemia not requiring transfusions and thrombocytosis. Patient has been having hematological abnormalities from extended period of time, the earliest noted 2005. Patient underwent at least 2 bone marrow biopsy evaluations, most recent on 01/09/17 documenting the recurrent condition. IPSS score 3 -- high risk, mainly due to advanced age of the patient, anemia, and some systemic symptoms attribution of which is a little bit challenging. Patient's somnolence is not clearly related to myelofibrosis. He does not appear to have any problems with splenomegaly based on physical exam or symptoms and his appetite is excellent.   Patient reports overall improvement in his symptoms since initiation of ruxolitinib.  Hematological profile shows progressive worsening of the white blood cell count demonstrating elevated neutrophils, and significant left shift with presence of immature forms with 2% blasts.  Patient has had an infectious disease recently and the appearance is potentially a leukemoid-type reaction in the setting of myelofibrosis, but gradual progression towards leukemia cannot be excluded although hemoglobin and platelet counts appear to be stable. - We should consider to start infectious prophylaxis with acyclovir, fluconazole, and Bactrim, but patient is hesitant to increase the medication burden at this point in time. Plan: --Continue ruxolitinib 29m PO BID --Return to clinic in 2 months with labs as outlined below, clinic visit.

## 2018-03-11 NOTE — Progress Notes (Signed)
[name]:  Assessment: Aaron [facility]) 84 y.o. 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 Patient has been having hematological abnormalities from extended period of time, the earliest noted 2005. Patient underwent at least 2 bone marrow biopsy evaluations, most recent on 01/09/17 documenting the recurrent condition. IPSS score 3 -- high risk, mainly due to advanced age of the patient, anemia, and some systemic symptoms attribution of which is a little bit challenging. Patient's somnolence is not clearly related to Aaron. He does not appear to have any problems with splenomegaly based on physical exam or symptoms and his appetite is excellent.   Patient reports overall improvement in his symptoms since initiation of ruxolitinib.  Hematological profile shows progressive worsening of the white blood cell count demonstrating elevated neutrophils, and significant left shift with presence of immature forms with 2% blasts.  Patient has had an infectious disease recently and the appearance is potentially a leukemoid-type reaction in the setting of Aaron, but gradual progression towards leukemia cannot be excluded although hemoglobin and platelet counts appear to be stable. - We should consider to start infectious prophylaxis with acyclovir, fluconazole, and Bactrim, but patient is hesitant to increase the medication burden at this point in time. Plan: --Continue ruxolitinib 34m PO BID --Return to clinic in 2 months with labs as outlined below, clinic visit.  Voice recognition software was used and creation of this note. Despite my best effort at editing the text, some misspelling/errors may have occurred.   Orders Placed This Encounter  Procedures  . CBC with Differential (Cancer Center Only)    Standing Status:   Future    Standing  Expiration Date:   09/05/2019  . CMP ([facility])    Standing Status:   Future    Standing Expiration Date:   03/05/2019  . Lactate dehydrogenase (LDH)    Standing Status:   Future    Standing Expiration Date:   03/04/2019  . Iron and TIBC    Standing Status:   Future    Standing Expiration Date:   03/05/2019  . Ferritin    Standing Status:   Future    Standing Expiration Date:   03/05/2019    Cancer Staging No matching staging information was found for the patient.  All questions were answered.  . The patient knows to call the clinic with any problems, questions or concerns.  This note was electronically signed.    History of Presenting Illness [name] [place] [place]. Patient returns to the clinic to discuss on review of the previous records. No new complaints since last visit to the clinic. It appears that he no longer experiences headaches, lightheadedness, or dizziness as his blood pressure control has improved.  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 Initially, patient was treated with lenalidomide, but experienced a syncopal event medication was discontinued. Subsequently, patient received no additional therapy. At this time, his son describes excessive somnolence with patient being able to fall asleep in the middle of the day while reading or while watching TV. Nevertheless, patient does not full asleep while engaged with others. He appears to rest well through the night as well without snoring. He wakes up only to urinate. Patient denies any fevers, chills, night sweats. No weight loss, no abdominal pain, early  satiety. His appetite is currently excellent. He does take describe dyspnea with exertion, but he is able to maintain reasonable activity level that he is satisfied with.  Patient returns to the  clinic today for continued hematological monitoring.  Patient is receiving therapy with ruxolitinib and appears to be tolerating medication without significant difficulties.  In the interim, patient had a bout of 2 weeks of cough and was seen by primary care provider on 02/11/18 with findings of upper respiratory tract infection which was treated with Z-Pak and poorly controlled diabetes mellitus.  The day prior, patient had a bout of dizziness and confusion which were attributed to hyperglycemia and upper respiratory tract infection.  Overall, patient reports some days and some bad days, but activity level has been increasing gradually with improving weather.  Hematological History: --BM Bx, 02/04/14: (Size decreased in number microcytosis and polychromasia without rouleaux no nucleated red blood cells. Leukocytosis decreased in number with absolute neutropenia and absolute lymphopenia. No increase in blasts. Platelets are normal in number. Aspirate demonstrates reasonably normal results. Overall cellularity 60% no evidence of lymphomatous infiltrate or increase in blasts. Flow cytometry positive for presence of a small lambda monoclonal CD5 negative, CD10 negative B-cell population.Cytogen -- 46,XY, del(9)(q13q22) --BM Bx, 01/09/17: Bone marrow aspirate a spicular and contained insufficient particles for evaluation. Peripheral blood smear demonstrated decreased erythrocytes with microcytosis hypochromasia, schistocytes, nucleated red blood cells. Decreased white blood cell count with absolute and relative neutropenia, and relative lymphocytosis and monocytosis. Overall bone marrow cellularity increased at approximately 80-85%, increased number of atypical megakaryocytes with extremely pleomorphic nuclei. Neutrophil maturation is partially arrested, no increase of blasts. Increased fibrosis. --Labs, 01/01/18: WBC   8.6, ANC 4.3, ALC 1.8, Mono 1.8, Eos 0.2, Baso 0.7, Hgb 9.0, MCV 75.6, MCH 23.1,        RDW  25.8, nRBC 33%, Plt 120; LFTs WNL, LDH 1562, Uric acid 7.9; --Labs, 01/29/18: WBC 10.3,              Hgb 8.3, MCV 77.0, MCH 23.2, MCHC 30.2, RDW 26.2, nRBC 55%, Plt 214; Na 132, Glc 317, Cr 1.62, Bicarb 22, Alb 3.2, AST 35 --Labs, 03/02/18: WBC 11.5, ANC 9.8, ALC 3.8, Mono 1.4, Baso 0.8, Blast 2%, Hgb 8.6, nRBC 48%, Plt 128; Fe 67, FeSat 23, TIBC 299, Ferritin 405; AST 44, ALT 26   Treatment Hx: --Lenalidomide, 02/05/17: Medication held single day of administration due to hospitalization with syncope. --Ruxolitinib (Jakafi) 67m PO BID, 11/19/17-:   Medical History: Past Medical History:  Diagnosis Date  . Diabetes mellitus without complication ([facility]   . Hypertension   . Myelodysplasia (myelodysplastic syndrome) ([facility]   . S/P placement of cardiac pacemaker 08/30/2017  . Sinus arrest   . Syncope   . Syncope and collapse 08/30/2017    Surgical History: Past Surgical History:  Procedure Laterality Date  . BONE MARROW BIOPSY  2017  . LOOP RECORDER REMOVAL N/A 08/29/2017   Procedure: LOOP RECORDER REMOVAL;  Surgeon: KDeboraha Sprang MD;  [facility];  Service: Cardiovascular;  Laterality: N/A;  . PACEMAKER IMPLANT Left 08/29/2017   St Jude generator  . PACEMAKER IMPLANT N/A 08/29/2017   Procedure: Pacemaker Implant;  Surgeon: KDeboraha Sprang MD;  [facility];  Service: Cardiovascular;  Laterality: N/A;  . PORTACATH PLACEMENT  2017    Family History: Family History  Problem Relation Age of Onset  . Hypertension Mother     Social History: Social History   Socioeconomic History  .  Marital status: Divorced    Spouse name: Not on file  . Number of children: Not on file  . Years of education: Not on file  . Highest education level: Not on file  Social Needs  . Financial resource strain: Not on file  . Food insecurity - worry: Not on file  . Food insecurity - inability: Not on file  . Transportation needs - medical: Not on file  . Transportation  needs - non-medical: Not on file  Occupational History  . Not on file  Tobacco Use  . Smoking status: Never Smoker  . Smokeless tobacco: Never Used  Substance and Sexual Activity  . Alcohol use: No  . Drug use: No  . Sexual activity: Not on file  Other Topics Concern  . Not on file  Social History Narrative  . Not on file    Allergies: No Known Allergies  Medications:  Current Outpatient Medications  Medication Sig Dispense Refill  . ACCU-CHEK FASTCLIX LANCETS MISC Use as directed 3 times daily. E11.9 100 each 12  . acetaminophen (TYLENOL) 325 MG tablet Take 2 tablets (650 mg total) by mouth every 4 (four) hours as needed for mild pain.    Marland Kitchen amLODipine (NORVASC) 10 MG tablet TAKE 1 TABLET BY MOUTH DAILY 90 tablet 3  . aspirin EC 81 MG tablet Take 81 mg by mouth daily.    Marland Kitchen azithromycin (ZITHROMAX) 250 MG tablet Take 2 today then 1 days 2-5 6 tablet 0  . Blood Glucose Monitoring Suppl (ACCU-CHEK AVIVA PLUS) w/Device KIT Use as directed 3 times daily. E11.9 1 kit 0  . glucose blood (ACCU-CHEK AVIVA PLUS) test strip Use as directed 3 times daily. E11.9 100 each 12  . Insulin Glargine (LANTUS SOLOSTAR) 100 UNIT/ML Solostar Pen Inject 50 Units into the skin every morning. 15 mL 3  . insulin regular (HUMULIN R) 100 units/mL injection Inject 0.1 mLs (10 Units total) into the skin 3 (three) times daily before meals. For Blood Sugar greater than 250 10 mL 11  . isosorbide mononitrate (IMDUR) 30 MG 24 hr tablet Take 1 tablet (30 mg total) by mouth daily. 90 tablet 3  . lidocaine (XYLOCAINE) 2 % solution Apply small amount to painful gums with a qtip as needed.  Avoid swallowing 100 mL 0  . NITROSTAT 0.4 MG SL tablet Place 1 tablet (0.4 mg total) under the tongue every 5 (five) minutes as needed for chest pain. 25 tablet 3  . ruxolitinib phosphate (JAKAFI) 10 MG tablet Take 1 tablet (10 mg total) by mouth 2 (two) times daily. 60 tablet 0  . tamsulosin (FLOMAX) 0.4 MG CAPS capsule Take 1  capsule (0.4 mg total) by mouth daily. 30 capsule 3   No current facility-administered medications for this visit.     Review of Systems: Review of Systems  Constitutional: Positive for appetite change and fatigue.       Patient and his son report excessive somnolence with the patient being able to follow sleep any time not actively engaged with other people.  Neurological: Positive for dizziness. Negative for seizures and speech difficulty.  All other systems reviewed and are negative.    PHYSICAL EXAMINATION Blood pressure 130/64, pulse 65, temperature 97.6 F (36.4 C), temperature source Oral, resp. rate 17, height '5\' 7"'  (1.702 m), weight 151 lb (68.5 kg), SpO2 100 %.  ECOG PERFORMANCE STATUS: 2 - Symptomatic, <50% confined to bed  Physical Exam  Constitutional:  Elderly frail male in no acute distress  HENT:  Head: Normocephalic and atraumatic.  Mouth/Throat: Oropharynx is clear and moist. No oropharyngeal exudate.  Eyes: EOM are normal. Pupils are equal, round, and reactive to light. No scleral icterus.  Neck: No thyromegaly present.  Cardiovascular: Normal rate and regular rhythm.  No murmur heard. Pulmonary/Chest: Breath sounds normal. No respiratory distress. He has no wheezes.  Abdominal: Soft. He exhibits no distension.  No hepatosplenomegaly  Musculoskeletal: He exhibits no edema.  Lymphadenopathy:    He has no cervical adenopathy.  Neurological: He is alert. He has normal reflexes. No cranial nerve deficit.  Skin: Skin is warm. No rash noted. No pallor.      LABORATORY DATA: I have personally reviewed the data as listed: Appointment on 03/02/2018  Component Date Value Ref Range Status  . Ferritin 03/02/2018 405* 22 - 316 ng/mL Final   Performed at [facility], [place] [address], [place], [place] [address]  . Iron 03/02/2018 67  42 - 163 ug/dL Final  . TIBC 03/02/2018 299  202 - 409 ug/dL Final  . Saturation Ratios 03/02/2018 23* 42 -  163 % Final  . UIBC 03/02/2018 231  ug/dL Final   Performed at [facility], [place] [address], [place], [place] [address]  . LDH 03/02/2018 1,597* 125 - 245 U/L Final   Performed at [facility], [place] [address], [place], [place] [address]  . Sodium 03/02/2018 133* 136 - 145 mmol/L Final  . Potassium 03/02/2018 5.4* 3.5 - 5.1 mmol/L Final   NO VISIBLE HEMOLYSIS  . Chloride 03/02/2018 103  98 - 109 mmol/L Final  . CO2 03/02/2018 22  22 - 29 mmol/L Final  . Glucose, Bld 03/02/2018 311* 70 - 140 mg/dL Final  . BUN 03/02/2018 29* 7 - 26 mg/dL Final  . Creatinine 03/02/2018 1.78* 0.70 - 1.30 mg/dL Final  . Calcium 03/02/2018 9.1  8.4 - 10.4 mg/dL Final  . Total Protein 03/02/2018 7.7  6.4 - 8.3 g/dL Final  . Albumin 03/02/2018 3.5  3.5 - 5.0 g/dL Final  . AST 03/02/2018 44* 5 - 34 U/L Final  . ALT 03/02/2018 26  0 - 55 U/L Final  . Alkaline Phosphatase 03/02/2018 120  40 - 150 U/L Final  . Total Bilirubin 03/02/2018 0.9  0.2 - 1.2 mg/dL Final  . GFR, Est Non Af Am 03/02/2018 33* >60 mL/min Final  . GFR, Est AFR Am 03/02/2018 38* >60 mL/min Final   Comment: (NOTE) The eGFR has been calculated using the CKD EPI equation. This calculation has not been validated in all clinical situations. eGFR's persistently <60 mL/min signify possible Chronic Kidney Disease.   Georgiann Hahn gap 03/02/2018 8  3 - 11 Final   Performed at [facility], [place] [address], [place], [place] [address]  . WBC Count 03/02/2018 11.5* 4.0 - 10.3 K/uL Final  . RBC 03/02/2018 3.59* 4.20 - 5.82 MIL/uL Final  . Hemoglobin 03/02/2018 8.6* 13.0 - 17.1 g/dL Final  . HCT 03/02/2018 27.5* 38.4 - 49.9 % Final  . MCV 03/02/2018 76.7* 79.3 - 98.0 fL Final  . MCH 03/02/2018 24.0* 27.2 - 33.4 pg Final  . MCHC 03/02/2018 31.3* 32.0 - 36.0 g/dL Final  . RDW 03/02/2018 26.0* 11.0 - 14.6 % Final  . Platelet Count 03/02/2018 128* 140 - 400 K/uL Final   PLATELET COUNT  CONFIRMED BY SMEAR  . Neutrophils Relative % 03/02/2018 17  % Final  . Lymphocytes Relative 03/02/2018 33  %  Final  . Monocytes Relative 03/02/2018 12  % Final  . Eosinophils Relative 03/02/2018 4  % Final  . Basophils Relative 03/02/2018 7  % Final  . Band Neutrophils 03/02/2018 10  % Final  . Metamyelocytes Relative 03/02/2018 6  % Final  . Myelocytes 03/02/2018 8  % Final  . Promyelocytes Absolute 03/02/2018 1  % Final  . Blasts 03/02/2018 2  % Final   GAVE REPORT TO DR Braedan Meuth   . nRBC 03/02/2018 48* 0 /100 WBC Final  . Other 03/02/2018 0  % Final  . Neutro Abs 03/02/2018 4.8  1.5 - 6.5 K/uL Final  . Lymphs Abs 03/02/2018 3.8* 0.9 - 3.3 K/uL Final  . Monocytes Absolute 03/02/2018 1.4* 0.1 - 0.9 K/uL Final  . Eosinophils Absolute 03/02/2018 0.5  0.0 - 0.5 K/uL Final  . Basophils Absolute 03/02/2018 0.8* 0.0 - 0.1 K/uL Final  . Smear Review 03/02/2018 Large and giant plts   Final   Comment: Sl poly and basophilic stippling Few ovalos, teardrop, target and schistocytes Performed at [facility], [place] [address], [place], [place] [address]        Ardath Sax, MD

## 2018-03-11 NOTE — Telephone Encounter (Signed)
Confirmed remote transmission w/ pt son.    

## 2018-03-12 ENCOUNTER — Encounter: Payer: Self-pay | Admitting: Cardiology

## 2018-03-26 IMAGING — CT CT ANGIO CHEST
2 of 7 series · 17 of 46 positions shown · IV contrast (APPLIED)
Comparison: Chest x-ray 09/16/2017

CLINICAL DATA: Pt c/o left sided chest pain, fatigue and SOB that
began yesterday. Pt denies hx of PE DM, HTN, non smoker.

EXAM:
CT ANGIOGRAPHY CHEST WITH CONTRAST
TECHNIQUE: Multidetector CT imaging of the chest was performed using the
standard protocol during bolus administration of intravenous
contrast. Multiplanar CT image reconstructions and MIPs were
obtained to evaluate the vascular anatomy.
CONTRAST:  100 cc Isovue 370

[Series 7: thins · axial · 0.70mm/px · z∈[-278,-21]mm · 14 of 413 slices shown]
[im 23/413  lung]
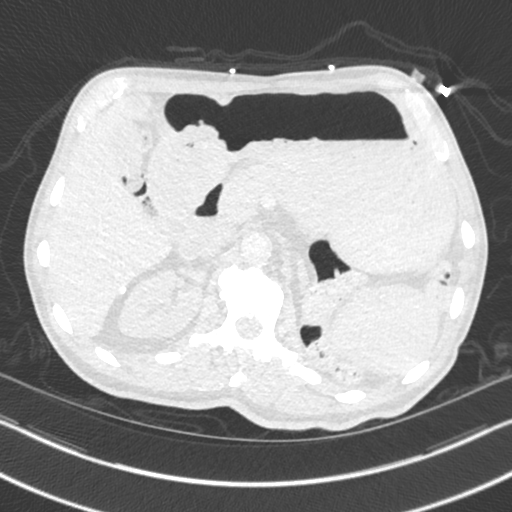
[im 46/413  soft-tissue]
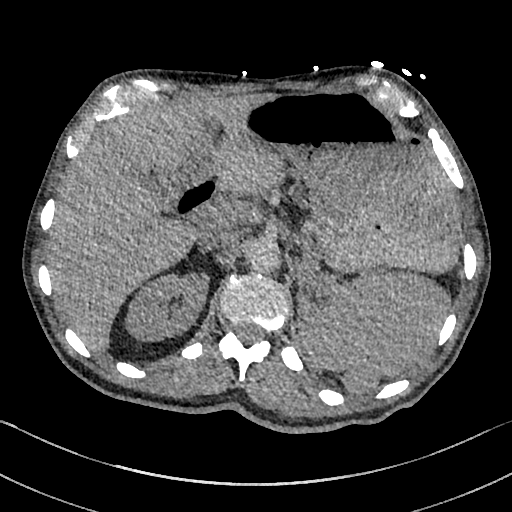
[im 92/413  lung]
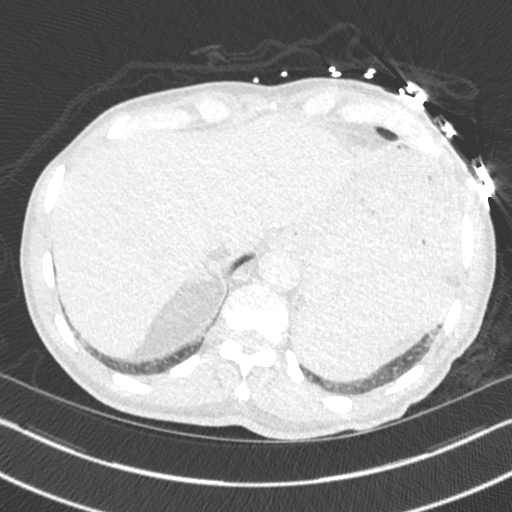
[im 115/413  soft-tissue]
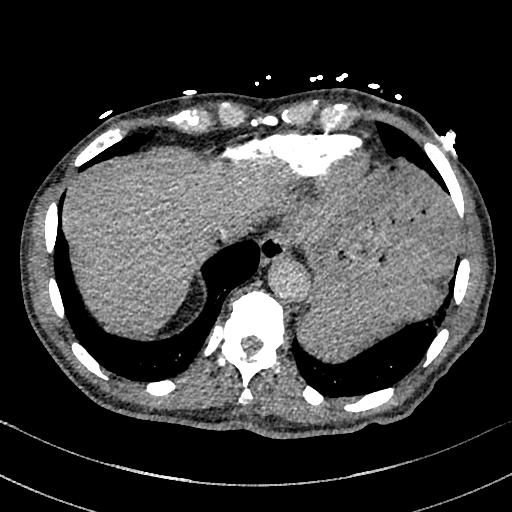
[im 138/413  lung]
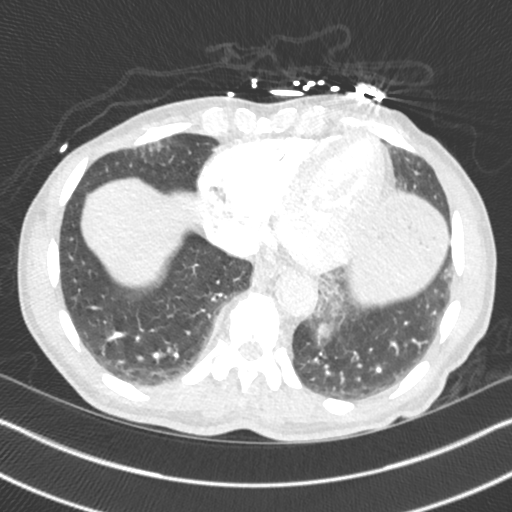
[im 161/413  soft-tissue]
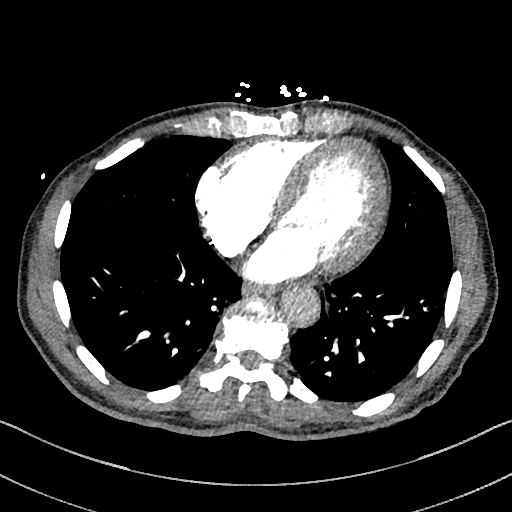
[im 184/413  lung]
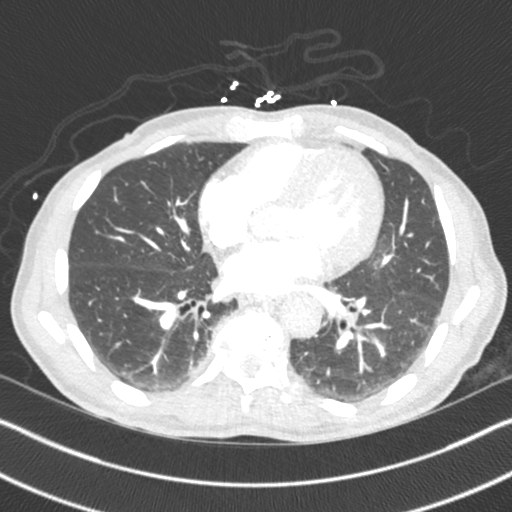
[im 229/413  soft-tissue]
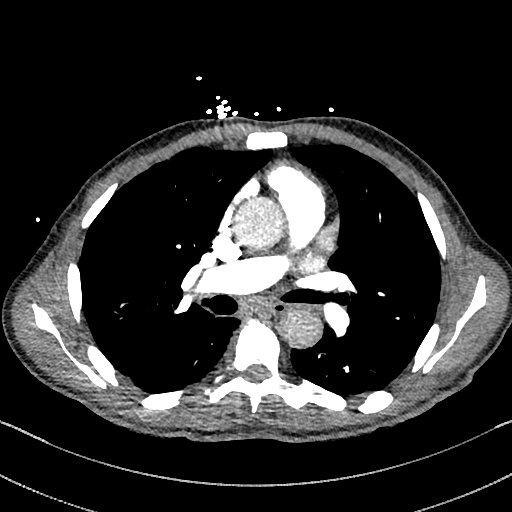
[im 252/413  lung]
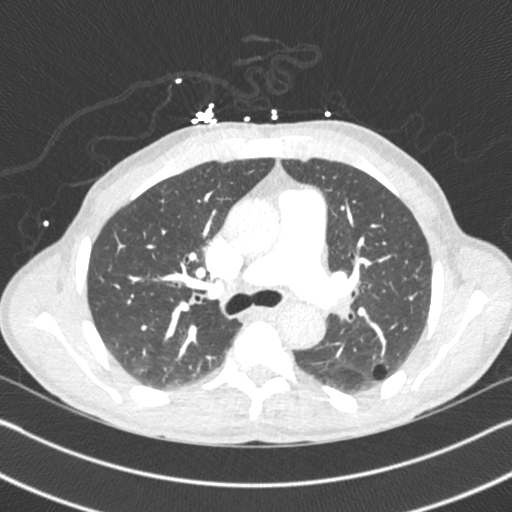
[im 275/413  soft-tissue]
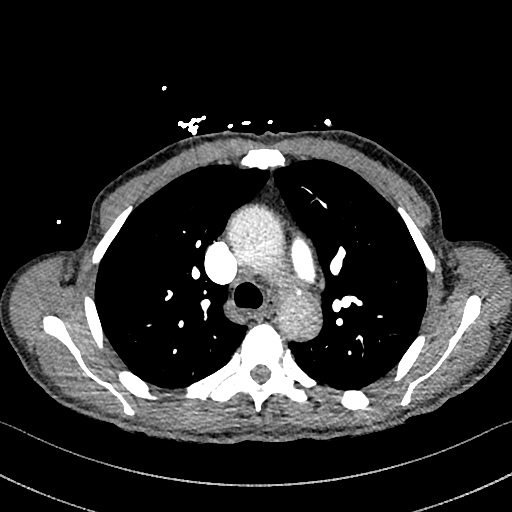
[im 298/413  lung]
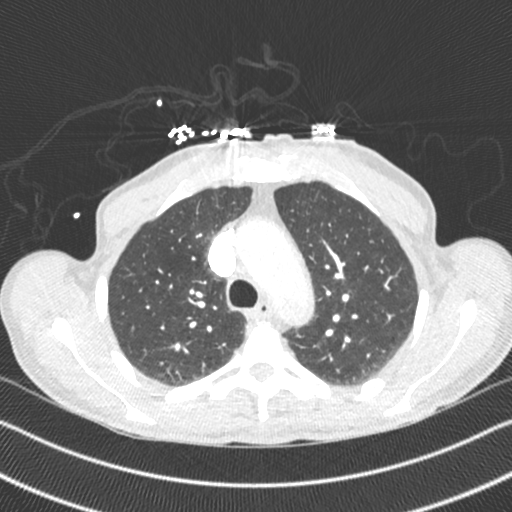
[im 321/413  soft-tissue]
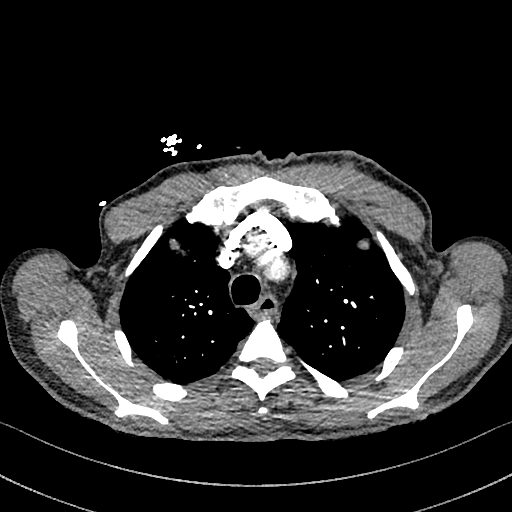
[im 367/413  lung]
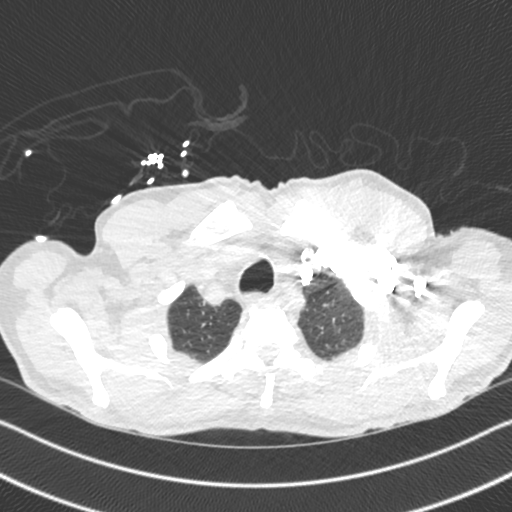
[im 390/413  soft-tissue]
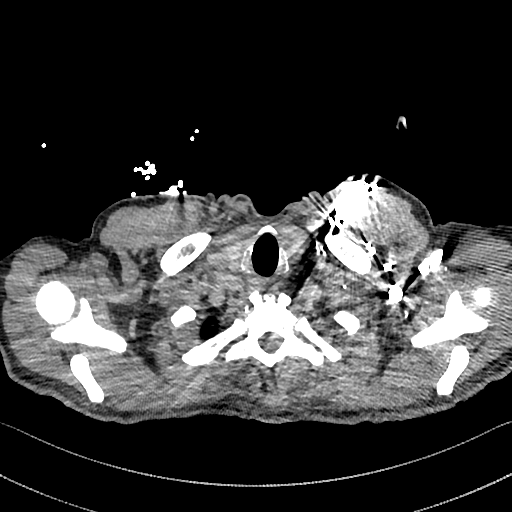

[Series 8: cor · coronal · 0.57mm/px · 3 of 136 slices shown]
[im 34/136  soft-tissue]
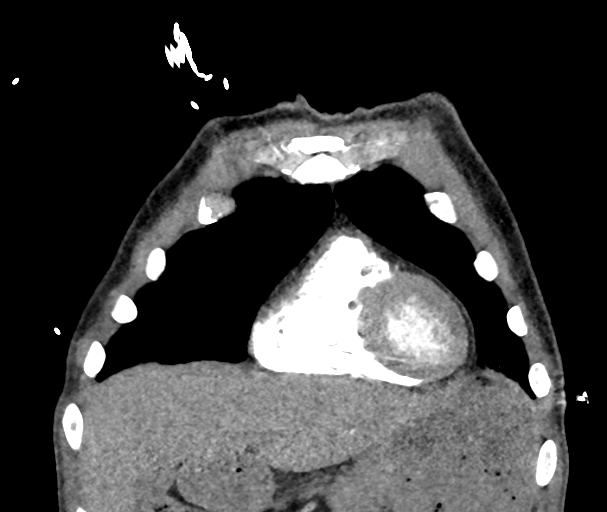
[im 68/136  soft-tissue]
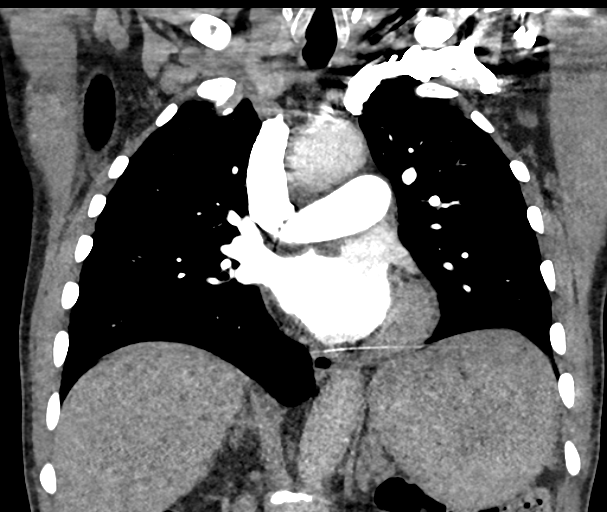
[im 102/136  soft-tissue]
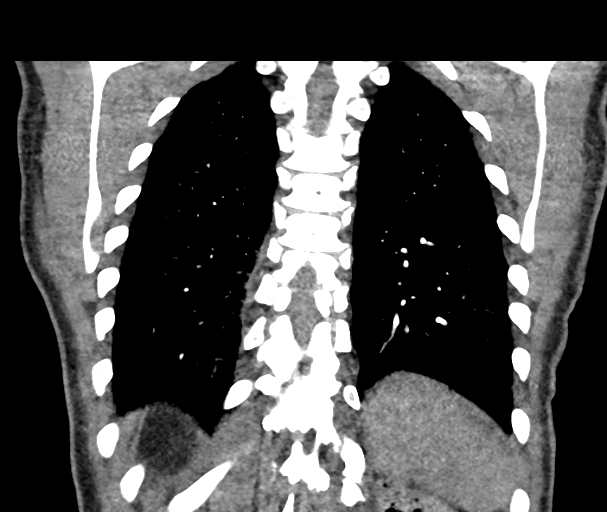

[17 of 46 positions shown; findings below may reference images not displayed]

FINDINGS: Cardiovascular: Pulmonary arteries are well opacified. There is no
acute pulmonary embolus. There is minimal atherosclerotic
calcification of the thoracic aorta. The thoracic arch measures
cm. Ascending aorta is not aneurysmal appear EDC aorta is not
aneurysmal. The evaluation of the great vessels is slightly limited
by artifact in the contrast bolus timing.

Mediastinum/Nodes: No significant mediastinal adenopathy. The
esophagus is normal in appearance.

Lungs/Pleura: There is atelectasis versus scarring at the lung
bases, left greater than right. No suspicious pulmonary nodules,
consolidations, or pleural effusions.

Upper Abdomen: Within the left upper quadrant, there is a soft
tissue attenuation mass which may represent the spleen. However,
normal kidney is identified in the left upper quadrant and a left
renal mass is not entirely excluded. Has the patient had a left
nephrectomy? Suspect left upper quadrant splenule to be present.

Musculoskeletal: There are degenerative changes in the thoracic
spine.

Review of the MIP images confirms the above findings.
IMPRESSION: 1. Technically adequate exam showing no acute pulmonary embolus.
2. Aortic arch aneurysm measuring 3.7 cm. Recommend annual imaging
followup by CTA or MRA. This recommendation follows 2020
ACCF/AHA/AATS/ACR/ASA/SCA/ARKADI MAIA/RAGHOE/HUIAM/BUNDJE Guidelines for the
Diagnosis and Management of Patients with Thoracic Aortic Disease.
Circulation.2020; 121: [id number]
3.  Aortic atherosclerosis.  (ZUVQD-T9N.N)
4.  Aortic aneurysm NOS (ZUVQD-ZFM.Y)
5. No normal left kidney identified in the images of the upper
abdomen. Has the patient had left nephrectomy? . Suspect normal
spleen within the left upper quadrant. However, the left renal mass
is not entirely excluded given the limited images performed of the
upper abdomen. Would recommend correlation with patient's history.
The patient has not had nephrectomy, further imaging with abdominal
ultrasound is recommended to confirm normal appearance of the left
kidney and spleen. Alternatively, CT of the abdomen and pelvis could
be performed following the administration intravenous contrast.

## 2018-03-26 IMAGING — CR DG CHEST 2V
2 series · 2 of 2 positions shown · non-contrast
Comparison: Chest x-ray of 08/30/2017

CLINICAL DATA: Chest tightness, neck pain, shortness breath and
dizziness over the last 4 days

EXAM:
CHEST  2 VIEW

[chest lat]
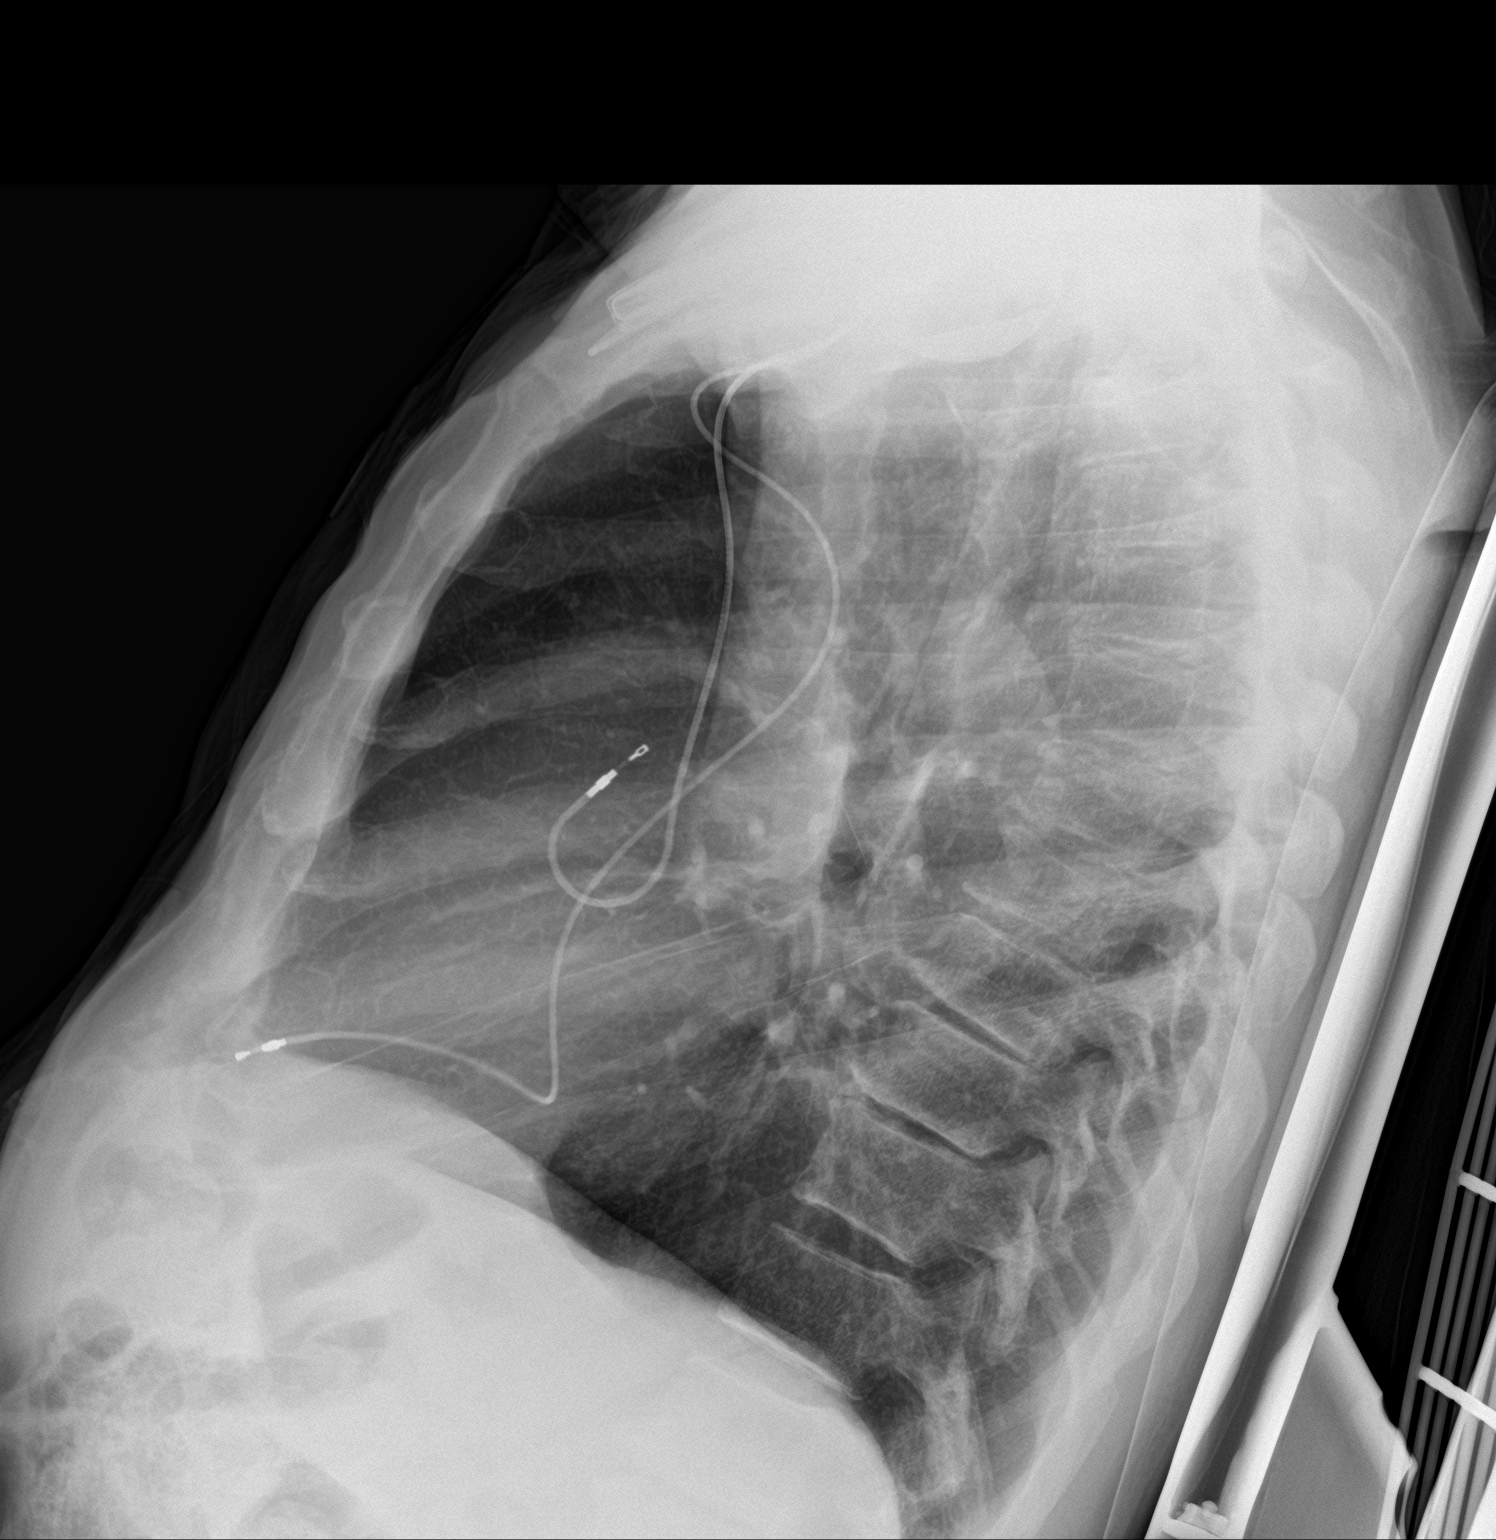

[chest ap]
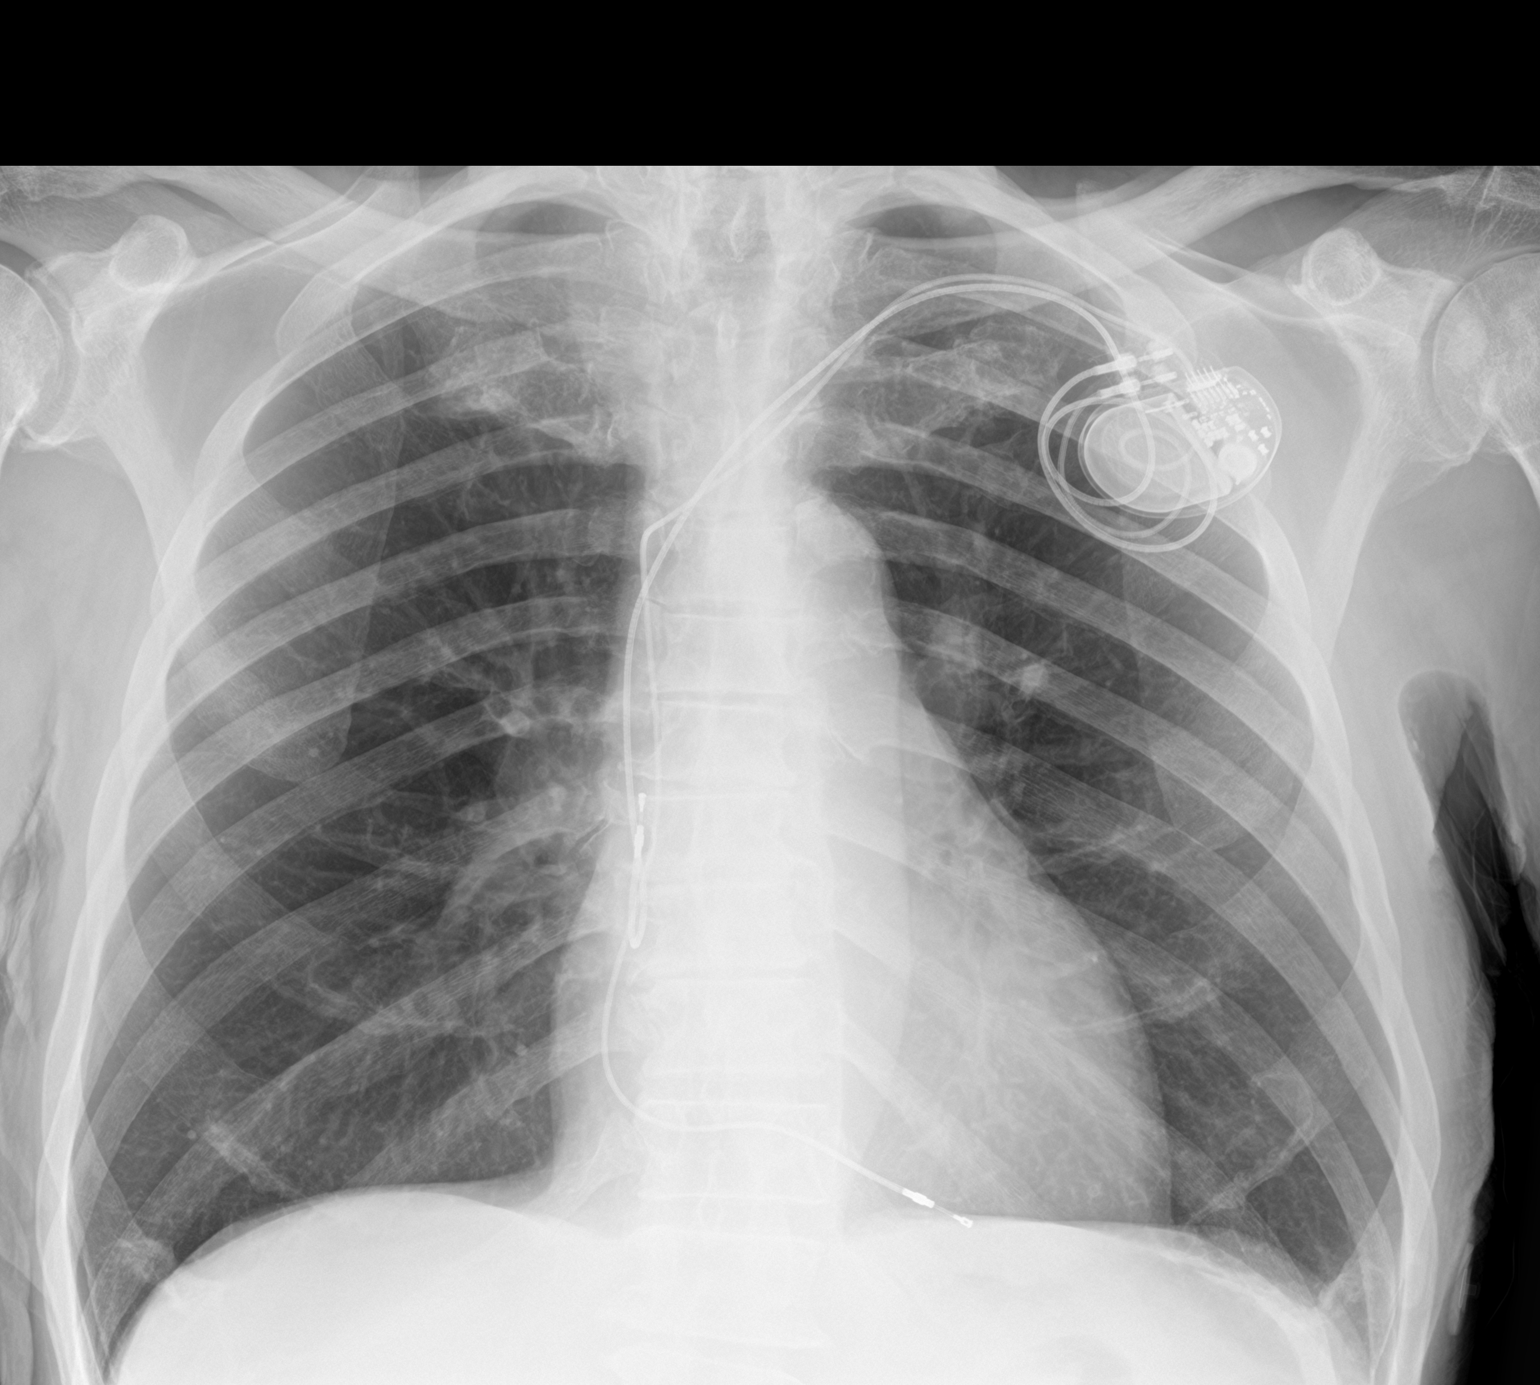

[2 of 2 positions shown; findings below may reference images not displayed]

FINDINGS: The lungs are clear but hyperaerated suggesting and element of
emphysema. Mediastinal and hilar contours are unremarkable. The
heart is within upper limits of normal. Permanent pacemaker remains.
No bony abnormality is seen.
IMPRESSION: 1. Hyperaeration.  Possible mild emphysematous change.
2. No active lung disease.
3. Permanent pacemaker remains.

## 2018-04-08 ENCOUNTER — Ambulatory Visit: Payer: Medicare Other | Attending: Internal Medicine | Admitting: Physician Assistant

## 2018-04-08 VITALS — BP 112/65 | HR 67 | Temp 98.2°F | Resp 16 | Ht 67.0 in | Wt 147.8 lb

## 2018-04-08 DIAGNOSIS — Z950 Presence of cardiac pacemaker: Secondary | ICD-10-CM | POA: Insufficient documentation

## 2018-04-08 DIAGNOSIS — I10 Essential (primary) hypertension: Secondary | ICD-10-CM | POA: Diagnosis not present

## 2018-04-08 DIAGNOSIS — Z794 Long term (current) use of insulin: Secondary | ICD-10-CM

## 2018-04-08 DIAGNOSIS — R3911 Hesitancy of micturition: Secondary | ICD-10-CM | POA: Diagnosis not present

## 2018-04-08 DIAGNOSIS — E1165 Type 2 diabetes mellitus with hyperglycemia: Secondary | ICD-10-CM | POA: Diagnosis not present

## 2018-04-08 DIAGNOSIS — N400 Enlarged prostate without lower urinary tract symptoms: Secondary | ICD-10-CM | POA: Diagnosis not present

## 2018-04-08 DIAGNOSIS — Z79899 Other long term (current) drug therapy: Secondary | ICD-10-CM | POA: Insufficient documentation

## 2018-04-08 DIAGNOSIS — N401 Enlarged prostate with lower urinary tract symptoms: Secondary | ICD-10-CM | POA: Diagnosis not present

## 2018-04-08 DIAGNOSIS — N3000 Acute cystitis without hematuria: Secondary | ICD-10-CM | POA: Diagnosis not present

## 2018-04-08 DIAGNOSIS — Z7982 Long term (current) use of aspirin: Secondary | ICD-10-CM | POA: Insufficient documentation

## 2018-04-08 DIAGNOSIS — IMO0001 Reserved for inherently not codable concepts without codable children: Secondary | ICD-10-CM

## 2018-04-08 DIAGNOSIS — Z9114 Patient's other noncompliance with medication regimen: Secondary | ICD-10-CM | POA: Insufficient documentation

## 2018-04-08 LAB — POCT URINALYSIS DIPSTICK
Bilirubin, UA: NEGATIVE
Glucose, UA: 500
Ketones, UA: NEGATIVE
NITRITE UA: NEGATIVE
Protein, UA: NEGATIVE
RBC UA: NEGATIVE
SPEC GRAV UA: 1.01 (ref 1.010–1.025)
Urobilinogen, UA: 1 E.U./dL
pH, UA: 5.5 (ref 5.0–8.0)

## 2018-04-08 LAB — GLUCOSE, POCT (MANUAL RESULT ENTRY)
POC GLUCOSE: 414 mg/dL — AB (ref 70–99)
POC Glucose: 418 mg/dl — AB (ref 70–99)

## 2018-04-08 LAB — POCT GLYCOSYLATED HEMOGLOBIN (HGB A1C): HEMOGLOBIN A1C: 11.4

## 2018-04-08 MED ORDER — INSULIN PEN NEEDLE 31G X 5 MM MISC: 3 refills | Status: AC

## 2018-04-08 MED ORDER — NITROFURANTOIN MONOHYD MACRO 100 MG PO CAPS: 100.0000 mg | ORAL_CAPSULE | Freq: Two times a day (BID) | ORAL | 0 refills | Status: DC

## 2018-04-08 MED ORDER — TAMSULOSIN HCL 0.4 MG PO CAPS: 0.4000 mg | ORAL_CAPSULE | Freq: Every day | ORAL | 3 refills | Status: DC

## 2018-04-08 MED ORDER — INSULIN ASPART 100 UNIT/ML ~~LOC~~ SOLN
30.0000 [IU] | Freq: Once | SUBCUTANEOUS | Status: AC
  Administered 2018-04-08: 30 [IU] via SUBCUTANEOUS

## 2018-04-08 MED ORDER — GLUCOSE BLOOD VI STRP: ORAL_STRIP | 12 refills | Status: AC

## 2018-04-08 NOTE — Progress Notes (Signed)
Pt. Stated he is having problem with his penis slowly urinates and drips.  No pain.   Pt. Stated when he have to pee, he also feels like bowl movement.

## 2018-04-08 NOTE — Patient Instructions (Addendum)
Check blood sugars fasting and with meals and at bedtime and record and bring to your next visit.  Make sure not to skip medication doses.  Drink a lot of water.  Do not eat sugary foods or high carbohydrate foods.

## 2018-04-08 NOTE — Progress Notes (Signed)
Patient ID: Aaron Nelson, male   DOB: 1930-05-16, 83 y.o.   MRN: [id number]      Aaron Nelson, is a 84 y.o. male  [id number]:[id number]  [id number]  DOB - 1930/07/08  Subjective:  Chief Complaint and HPI: Aaron Nelson is a 84 y.o. male here today to Presents today with some urinary hesitancy and straining with occasional burning on urination.  S/sx X 1 week.  No flank or abdominal pain.  No f/c.  No vomiting.  Not taking flomax for more than 1 month.    Says blood sugars at home between 100 to 150.  Says it was 250 this morning and he ate cereal for breakfast.  He doesn't always take his insulin as directed. Denies s/sx hyper/hypoglycemia.    ROS:   Constitutional:  No f/c, No night sweats, No unexplained weight loss. EENT:  No vision changes, No blurry vision, No hearing changes. No mouth, throat, or ear problems.  Respiratory: No cough, No SOB Cardiac: No CP, no palpitations GI:  No abd pain, No N/V/D. GU: +urinary s/sx Musculoskeletal: No joint pain Neuro: No headache, no dizziness, no motor weakness.  Skin: No rash Endocrine:  No polydipsia. No polyuria.  Psych: Denies SI/HI  No problems updated.  ALLERGIES: No Known Allergies  PAST MEDICAL HISTORY: Past Medical History:  Diagnosis Date  . Diabetes mellitus without complication ([place])   . Hypertension   . Myelodysplasia (myelodysplastic syndrome) ([place])   . S/P placement of cardiac pacemaker 08/30/2017  . Sinus arrest   . Syncope   . Syncope and collapse 08/30/2017    MEDICATIONS AT HOME: Prior to Admission medications   Medication Sig Start Date End Date Taking? Authorizing Provider  ACCU-CHEK FASTCLIX LANCETS MISC Use as directed 3 times daily. E11.9 11/20/17  Yes Tresa Garter, MD  amLODipine (NORVASC) 10 MG tablet TAKE 1 TABLET BY MOUTH DAILY 12/11/17  Yes Deboraha Sprang, MD  aspirin EC 81 MG tablet Take 81 mg by mouth daily.   Yes [provider]  Blood Glucose Monitoring Suppl (ACCU-CHEK AVIVA PLUS)  w/Device KIT Use as directed 3 times daily. E11.9 11/20/17  Yes Jegede, Olugbemiga E, MD  glucose blood (ACCU-CHEK AVIVA PLUS) test strip Use as directed 3 times daily. E11.9 11/20/17  Yes Jegede, Marlena Clipper, MD  Insulin Glargine (LANTUS SOLOSTAR) 100 UNIT/ML Solostar Pen Inject 50 Units into the skin every morning. 12/10/17  Yes Jegede, Marlena Clipper, MD  insulin regular (HUMULIN R) 100 units/mL injection Inject 0.1 mLs (10 Units total) into the skin 3 (three) times daily before meals. For Blood Sugar greater than 250 12/10/17  Yes Jegede, Olugbemiga E, MD  acetaminophen (TYLENOL) 325 MG tablet Take 2 tablets (650 mg total) by mouth every 4 (four) hours as needed for mild pain. Patient not taking: Reported on 04/08/2018 08/30/17   Isaiah Serge, NP  isosorbide mononitrate (IMDUR) 30 MG 24 hr tablet Take 1 tablet (30 mg total) by mouth daily. Patient not taking: Reported on 04/08/2018 12/10/17   Tresa Garter, MD  lidocaine (XYLOCAINE) 2 % solution Apply small amount to painful gums with a qtip as needed.  Avoid swallowing Patient not taking: Reported on 04/08/2018 08/13/17   Argentina Donovan, PA-C  nitrofurantoin, macrocrystal-monohydrate, (MACROBID) 100 MG capsule Take 1 capsule (100 mg total) by mouth 2 (two) times daily. 04/08/18   Chinmayi Rumer, Dionne Bucy, PA-C  NITROSTAT 0.4 MG SL tablet Place 1 tablet (0.4 mg total) under the tongue every 5 (five) minutes as  needed for chest pain. Patient not taking: Reported on 04/08/2018 09/16/17 09/16/18  Belva Crome, MD  ruxolitinib phosphate (JAKAFI) 10 MG tablet Take 1 tablet (10 mg total) by mouth 2 (two) times daily. Patient not taking: Reported on 04/08/2018 11/20/17   Ardath Sax, MD  tamsulosin (FLOMAX) 0.4 MG CAPS capsule Take 1 capsule (0.4 mg total) by mouth daily. 04/08/18   Argentina Donovan, PA-C     Objective:  EXAM:   Vitals:   04/08/18 0907  BP: 112/65  Pulse: 67  Resp: 16  Temp: 98.2 F (36.8 C)  TempSrc: Oral  SpO2: 100%    Weight: 147 lb 12.8 oz (67 kg)  Height: _0  (1.702 m)    General appearance : A&OX3. NAD. Non-toxic-appearing, non-ill appearing, walks with cane, appears well for stated age and health conditions HEENT: Atraumatic and Normocephalic.  PERRLA. EOM intact.  Neck: supple, no JVD. No cervical lymphadenopathy. No thyromegaly Chest/Lungs:  Breathing-non-labored, Good air entry bilaterally, breath sounds normal without rales, rhonchi, or wheezing  CVS: S1 S2 regular, no murmurs, gallops, rubs  Extremities: Bilateral Lower Ext shows no edema, both legs are warm to touch with = pulse throughout Neurology:  CN II-XII grossly intact, Non focal.   Psych:  TP linear. J/I WNL. Normal speech. Appropriate eye contact and affect.  Skin:  No Rash  Data Review Lab Results  Component Value Date   HGBA1C 11.4 04/08/2018   HGBA1C 13.5 10/15/2017   HGBA1C 14.6 07/15/2017     Assessment & Plan   1. Acute cystitis without hematuria Increase water intake - Urine Culture - nitrofurantoin, macrocrystal-monohydrate, (MACROBID) 100 MG capsule; Take 1 capsule (100 mg total) by mouth 2 (two) times daily.  Dispense: 14 capsule; Refill: 0  2. Uncontrolled type 2 diabetes mellitus without complication, with long-term current use of insulin (HCC)-uncontrolled and non-compliance.   Work on diet and compliance with medications.  Discussed this at length and counseled face to face more than 30 mins.  Continue current regimen but increase compliance.   Check blood sugars fasting and with meals and at bedtime and record and bring to your next visit.  Make sure not to skip medication doses.  Drink a lot of water.  Do not eat sugary foods or high carbohydrate foods.   - HgB A1c - Glucose (CBG)-418 at beginning OV - Urinalysis Dipstick - insulin aspart (novoLOG) injection 30 Units-in office - Glucose (CBG)-down to 414  3. Essential hypertension Controlled-continue current regimen  4. Benign prostatic hyperplasia  with hesitancy-not controlled- Resume-  - tamsulosin (FLOMAX) 0.4 MG CAPS capsule; Take 1 capsule (0.4 mg total) by mouth daily.  Dispense: 30 capsule; Refill: 3  Patient have been counseled extensively about nutrition and exercise  Return in about 1 month (around 05/08/2018) for assign new PCP; follow up DM .  The patient was given clear instructions to go to ER or return to medical center if symptoms don't improve, worsen or new problems develop. The patient verbalized understanding. The patient was told to call to get lab results if they haven't heard anything in the next week.     Freeman Caldron, PA-C [facility] [place], [place]   04/08/2018, 9:45 AM

## 2018-04-10 LAB — URINE CULTURE

## 2018-04-12 ENCOUNTER — Other Ambulatory Visit: Payer: Self-pay | Admitting: Physician Assistant

## 2018-04-12 MED ORDER — CEPHALEXIN 500 MG PO CAPS: 500.0000 mg | ORAL_CAPSULE | Freq: Three times a day (TID) | ORAL | 0 refills | Status: DC

## 2018-04-13 ENCOUNTER — Telehealth: Payer: Self-pay

## 2018-04-13 NOTE — Telephone Encounter (Signed)
-----   Message from Argentina Donovan, [place] sent at 04/12/2018  2:50 PM EDT ----- Please call patient.  We need to add an additional antibiotic to get rid of his urinary tract infection.  I sent a prescription to his pharmacy. Thanks, Freeman Caldron, PA-C

## 2018-04-13 NOTE — Telephone Encounter (Signed)
CMA spoke to patient to inform new Rx has been sent.   Patient understood.  CMA also inform patient to that Medicare may not pay for the insulin that was given on his visit and he may have a bill for $12.00. Patient understood and will come to the OV to sign a form that he have acknowledge that.

## 2018-04-21 ENCOUNTER — Emergency Department (HOSPITAL_COMMUNITY): Payer: Medicare Other

## 2018-04-21 ENCOUNTER — Observation Stay (HOSPITAL_COMMUNITY)
Admission: EM | Admit: 2018-04-21 | Discharge: 2018-04-23 | Disposition: A | Discharge status: Home / Self Care | Payer: Medicare Other | Attending: Family Medicine | Admitting: Family Medicine

## 2018-04-21 ENCOUNTER — Encounter (HOSPITAL_COMMUNITY): Payer: Self-pay | Admitting: Emergency Medicine

## 2018-04-21 DIAGNOSIS — Z794 Long term (current) use of insulin: Secondary | ICD-10-CM | POA: Insufficient documentation

## 2018-04-21 DIAGNOSIS — D509 Iron deficiency anemia, unspecified: Secondary | ICD-10-CM | POA: Diagnosis not present

## 2018-04-21 DIAGNOSIS — E1165 Type 2 diabetes mellitus with hyperglycemia: Secondary | ICD-10-CM | POA: Diagnosis not present

## 2018-04-21 DIAGNOSIS — S12031A Nondisplaced posterior arch fracture of first cervical vertebra, initial encounter for closed fracture: Secondary | ICD-10-CM | POA: Diagnosis not present

## 2018-04-21 DIAGNOSIS — R42 Dizziness and giddiness: Secondary | ICD-10-CM | POA: Diagnosis not present

## 2018-04-21 DIAGNOSIS — W1830XA Fall on same level, unspecified, initial encounter: Secondary | ICD-10-CM | POA: Insufficient documentation

## 2018-04-21 DIAGNOSIS — S0993XA Unspecified injury of face, initial encounter: Secondary | ICD-10-CM | POA: Diagnosis not present

## 2018-04-21 DIAGNOSIS — S299XXA Unspecified injury of thorax, initial encounter: Secondary | ICD-10-CM | POA: Diagnosis not present

## 2018-04-21 DIAGNOSIS — Z7983 Long term (current) use of bisphosphonates: Secondary | ICD-10-CM | POA: Insufficient documentation

## 2018-04-21 DIAGNOSIS — R3911 Hesitancy of micturition: Secondary | ICD-10-CM

## 2018-04-21 DIAGNOSIS — Z950 Presence of cardiac pacemaker: Secondary | ICD-10-CM | POA: Diagnosis not present

## 2018-04-21 DIAGNOSIS — D7581 Myelofibrosis: Secondary | ICD-10-CM

## 2018-04-21 DIAGNOSIS — S12001A Unspecified nondisplaced fracture of first cervical vertebra, initial encounter for closed fracture: Secondary | ICD-10-CM | POA: Diagnosis not present

## 2018-04-21 DIAGNOSIS — M542 Cervicalgia: Secondary | ICD-10-CM | POA: Diagnosis not present

## 2018-04-21 DIAGNOSIS — E860 Dehydration: Secondary | ICD-10-CM | POA: Diagnosis not present

## 2018-04-21 DIAGNOSIS — Z7982 Long term (current) use of aspirin: Secondary | ICD-10-CM | POA: Diagnosis not present

## 2018-04-21 DIAGNOSIS — N401 Enlarged prostate with lower urinary tract symptoms: Secondary | ICD-10-CM | POA: Diagnosis present

## 2018-04-21 DIAGNOSIS — D471 Chronic myeloproliferative disease: Secondary | ICD-10-CM | POA: Diagnosis not present

## 2018-04-21 DIAGNOSIS — Z79899 Other long term (current) drug therapy: Secondary | ICD-10-CM | POA: Insufficient documentation

## 2018-04-21 DIAGNOSIS — R55 Syncope and collapse: Secondary | ICD-10-CM | POA: Diagnosis not present

## 2018-04-21 DIAGNOSIS — S022XXA Fracture of nasal bones, initial encounter for closed fracture: Secondary | ICD-10-CM | POA: Insufficient documentation

## 2018-04-21 DIAGNOSIS — D696 Thrombocytopenia, unspecified: Secondary | ICD-10-CM | POA: Diagnosis not present

## 2018-04-21 DIAGNOSIS — I455 Other specified heart block: Secondary | ICD-10-CM | POA: Diagnosis present

## 2018-04-21 DIAGNOSIS — I495 Sick sinus syndrome: Secondary | ICD-10-CM | POA: Diagnosis not present

## 2018-04-21 DIAGNOSIS — S4992XA Unspecified injury of left shoulder and upper arm, initial encounter: Secondary | ICD-10-CM | POA: Diagnosis not present

## 2018-04-21 DIAGNOSIS — R0602 Shortness of breath: Secondary | ICD-10-CM | POA: Diagnosis not present

## 2018-04-21 DIAGNOSIS — N400 Enlarged prostate without lower urinary tract symptoms: Secondary | ICD-10-CM | POA: Insufficient documentation

## 2018-04-21 DIAGNOSIS — S199XXA Unspecified injury of neck, initial encounter: Secondary | ICD-10-CM | POA: Diagnosis not present

## 2018-04-21 DIAGNOSIS — I10 Essential (primary) hypertension: Secondary | ICD-10-CM | POA: Diagnosis not present

## 2018-04-21 DIAGNOSIS — IMO0001 Reserved for inherently not codable concepts without codable children: Secondary | ICD-10-CM

## 2018-04-21 DIAGNOSIS — S0990XA Unspecified injury of head, initial encounter: Secondary | ICD-10-CM | POA: Diagnosis not present

## 2018-04-21 LAB — COMPREHENSIVE METABOLIC PANEL
ALT: 23 U/L (ref 17–63)
AST: 45 U/L — ABNORMAL HIGH (ref 15–41)
Albumin: 4 g/dL (ref 3.5–5.0)
Alkaline Phosphatase: 121 U/L (ref 38–126)
Anion gap: 9 (ref 5–15)
BUN: 25 mg/dL — ABNORMAL HIGH (ref 6–20)
CO2: 21 mmol/L — ABNORMAL LOW (ref 22–32)
Calcium: 9.4 mg/dL (ref 8.9–10.3)
Chloride: 106 mmol/L (ref 101–111)
Creatinine, Ser: 1.32 mg/dL — ABNORMAL HIGH (ref 0.61–1.24)
GFR calc Af Amer: 55 mL/min — ABNORMAL LOW (ref >60)
GFR calc non Af Amer: 47 mL/min — ABNORMAL LOW (ref >60)
Glucose, Bld: 177 mg/dL — ABNORMAL HIGH (ref 65–99)
Potassium: 4.4 mmol/L (ref 3.5–5.1)
Sodium: 136 mmol/L (ref 135–145)
Total Bilirubin: 1.3 mg/dL — ABNORMAL HIGH (ref 0.3–1.2)
Total Protein: 8.5 g/dL — ABNORMAL HIGH (ref 6.5–8.1)

## 2018-04-21 LAB — URINALYSIS, ROUTINE W REFLEX MICROSCOPIC
BACTERIA UA: NONE SEEN
BILIRUBIN URINE: NEGATIVE
Glucose, UA: >=500 mg/dL — AB
KETONES UR: NEGATIVE mg/dL
Leukocytes, UA: NEGATIVE
Nitrite: NEGATIVE
PH: 5 (ref 5.0–8.0)
PROTEIN: NEGATIVE mg/dL
Specific Gravity, Urine: 1.011 (ref 1.005–1.030)

## 2018-04-21 LAB — TROPONIN I: Troponin I: <0.03 ng/mL (ref <0.03)

## 2018-04-21 MED ORDER — SODIUM CHLORIDE 0.9 % IV BOLUS
500.0000 mL | Freq: Once | INTRAVENOUS | Status: AC
  Administered 2018-04-22: 500 mL via INTRAVENOUS

## 2018-04-21 NOTE — ED Provider Notes (Signed)
Patient placed in Quick Look pathway, seen and evaluated   Chief Complaint: fall  HPI:   84 y.o. male arrive to the ED via EMS s/p fall at home. Fall was witnessed by family members who report that the patient lost his balance and fell. Patient reports that he was feeling dizzy at the time of the fall. Patient c/o pain to his face, neck and left wrist. Family member here with patient.   ROS: HEENT: facial pain  Neck: neck pain  Physical Exam:  BP 127/72 (BP Location: Left Arm)   Pulse 77   Temp 97.9 F (36.6 C) (Oral)   Resp 18   SpO2 100%    Gen: No distress  Neuro: Awake and Alert  Skin: facial wounds  M/S: left wrist swelling and tenderness, cervical spine tenderness.     Initiation of care has begun. The patient has been counseled on the process, plan, and necessity for staying for the completion/evaluation, and the remainder of the medical screening examination    Ashley Murrain, NP 04/21/18 1553    Charlesetta Shanks, MD 04/24/18 1428

## 2018-04-21 NOTE — H&P (Signed)
History and Physical    Aaron Nelson HBZ:[id number] DOB: 12-14-1930 DOA: 04/21/2018  PCP: Charlott Rakes, MD   Patient coming from: Home  Chief Complaint: Fall  HPI: Aaron Nelson is a 84 y.o. male with medical history significant for HTN, DM, myelofibrosis with KAK2 mutation, BPH, sinus arrest history status post cardiac pacemaker placement.  Patient was brought to the ED via EMS after he sustained a fall.  Fall was witnessed by his son.  Patient reports history of dizziness when he stands up, he sits for a while before getting up. Today while he was sitting he was dizzy, he sat for a couple of minutes, got up walked from the bedroom to the kitchen and fell face down. Patients son who was present denies patient feet slipping or patient loosing balance. Denies chest pain.  He has chronic shortness of breath for several months, which is unchanged and has being workup by his primary care provider.  Denies focal weakness of his extremities. Son notes gradual decline in strenght of the past few months.  Patient has maintained good p.o. Intake, vomiting, no loose stools, no abdominal pain.  He reports chronic difficulty with urination.  No jerking of extremities during or after the fall.  After fall, per Son, patient was awake, able to talk, but weak and had significant bleeding from nose.  ED Course: Stable vitals, with o2 sats >99% on room air.  EKG Q waves 2 3 aVF V4-V6.  BMP unremarkable.  WBC- 13, hemoglobin 10 at baseline, platelets 114 consistent with prior. chest x-ray negative for acute abnormality.  Cervical CT-linear lucency posterior aspect of C1 which is concerning for nondisplaced fracture. Trop - <0.03.  Patient was given 575m normal saline bolus.  Neurosurgery was consulted, EDP talked with Dr. SDelice Lesch who recommended c-collar , patient does not need intervention at this time , to follow-up as outpatient , does not suspect vascular injury based on fracture.  MAxillofacial CT-mildly displaced  nasal bone fracture, with overlying frontal scalp Nasal hematoma. Hospitalist was called to admit for syncope.  Review of Systems: As per HPI otherwise 10 point review of systems negative.   Past Medical History:  Diagnosis Date  . Diabetes mellitus without complication ([facility]   . Hypertension   . My[place]odysplasia ([place]odysplastic syndrome) ([place]   . S/P placement of cardiac pacemaker 08/30/2017  . Sinus arrest   . Syncope   . Syncope and collapse 08/30/2017    Past Surgical History:  Procedure Laterality Date  . BONE MARROW BIOPSY  2017  . LOOP RECORDER REMOVAL N/A 08/29/2017   Procedure: LOOP RECORDER REMOVAL;  Surgeon: KDeboraha Sprang MD;  [facility];  Service: Cardiovascular;  Laterality: N/A;  . PACEMAKER IMPLANT Left 08/29/2017   St Jude generator  . PACEMAKER IMPLANT N/A 08/29/2017   Procedure: Pacemaker Implant;  Surgeon: KDeboraha Sprang MD;  [facility];  Service: Cardiovascular;  Laterality: N/A;  . PORTACATH PLACEMENT  2017     reports that he has never smoked. He has never used smokeless tobacco. He reports that he does not drink alcohol or use drugs.  No Known Allergies  Family History  Problem Relation Age of Onset  . Hypertension Mother     Prior to Admission medications   Medication Sig Start Date End Date Taking? Authorizing Provider  acetaminophen (TYLENOL) 325 MG tablet Take 2 tablets (650 mg total) by mouth every 4 (four) hours as needed for mild pain. 08/30/17  Yes IDorene Ar  Otilio Carpen, NP  amLODipine (NORVASC) 10 MG tablet TAKE 1 TABLET BY MOUTH DAILY 12/11/17  Yes Deboraha Sprang, MD  aspirin EC 81 MG tablet Take 81 mg by mouth daily.   Yes [provider]  cephALEXin (KEFLEX) 500 MG capsule Take 1 capsule (500 mg total) by mouth 3 (three) times daily. 04/12/18  Yes McClung, [name] M, PA-C  Insulin Glargine (LANTUS SOLOSTAR) 100 UNIT/ML Solostar Pen Inject 50 Units into the skin every morning. 12/10/17  Yes Jegede, Marlena Clipper, MD  insulin regular (HUMULIN R) 100 units/mL injection Inject 0.1 mLs (10 Units total) into the skin 3 (three) times daily before meals. For Blood Sugar greater than 250 12/10/17  Yes Jegede, Olugbemiga E, MD  nitrofurantoin, macrocrystal-monohydrate, (MACROBID) 100 MG capsule Take 1 capsule (100 mg total) by mouth 2 (two) times daily. 04/08/18  Yes McClung, [name] M, PA-C  NITROSTAT 0.4 MG SL tablet Place 1 tablet (0.4 mg total) under the tongue every 5 (five) minutes as needed for chest pain. 09/16/17 09/16/18 Yes Belva Crome, MD  ruxolitinib phosphate (JAKAFI) 10 MG tablet Take 1 tablet (10 mg total) by mouth 2 (two) times daily. 11/20/17  Yes Ardath Sax, MD  tamsulosin (FLOMAX) 0.4 MG CAPS capsule Take 1 capsule (0.4 mg total) by mouth daily. 04/08/18  Yes McClung, Dionne Bucy, PA-C  ACCU-CHEK FASTCLIX LANCETS MISC Use as directed 3 times daily. E11.9 11/20/17   Tresa Garter, MD  Blood Glucose Monitoring Suppl (ACCU-CHEK AVIVA PLUS) w/Device KIT Use as directed 3 times daily. E11.9 11/20/17   Angelica Chessman E, MD  glucose blood (ACCU-CHEK AVIVA PLUS) test strip Use as directed 3 times daily. E11.9 04/08/18   Argentina Donovan, PA-C  Insulin Pen Needle 31G X 5 MM MISC Use as directed 04/08/18   Argentina Donovan, PA-C  isosorbide mononitrate (IMDUR) 30 MG 24 hr tablet Take 1 tablet (30 mg total) by mouth daily. Patient not taking: Reported on 04/08/2018 12/10/17   Tresa Garter, MD  lidocaine (XYLOCAINE) 2 % solution Apply small amount to painful gums with a qtip as needed.  Avoid swallowing Patient not taking: Reported on 04/08/2018 08/13/17   Argentina Donovan, [place]    Physical Exam: Vitals:   04/21/18 2130 04/21/18 2137 04/21/18 2230 04/21/18 2300  BP: (!) 151/86 (!) 151/86 (!) 145/83 (!) 146/88  Pulse: 77 78 71   Resp: 16 18 (!) 22 (!) 22  Temp:      TempSrc:      SpO2: 100% 100% 100% 100%    Constitutional: calm, comfortable, rigid neck collar in place. Vitals:    04/21/18 2130 04/21/18 2137 04/21/18 2230 04/21/18 2300  BP: (!) 151/86 (!) 151/86 (!) 145/83 (!) 146/88  Pulse: 77 78 71   Resp: 16 18 (!) 22 (!) 22  Temp:      TempSrc:      SpO2: 100% 100% 100% 100%   Eyes: PERRL, lids and conjunctivae normal ENMT: Mucous membranes are moist. Posterior pharynx clear of any exudate or lesions. Horizontal bruising on bridge of nose.   Neck: normal, supple, no masses, no thyromegaly Respiratory: clear to auscultation bilaterally, no wheezing, no crackles. Normal respiratory effort. No accessory muscle use.  Cardiovascular: Regular rate and rhythm, no murmurs / rubs / gallops. No extremity edema. 2+ pedal pulses. No carotid bruits.  Abdomen: no tenderness, no masses palpated. No hepatosplenomegaly. Bowel sounds positive.  Musculoskeletal: no clubbing / cyanosis. No joint deformity upper and lower  extremities. Good ROM, no contractures. Normal muscle tone.  Skin: no rashes, lesions, ulcers. No induration Neurologic: CN 2-12 grossly intact. Strength 5/5 in all 4.  Psychiatric: Normal judgment and insight. Alert and oriented x 3. Normal mood.   Labs on Admission: I have personally reviewed following labs and imaging studies  CBC: Recent Labs  Lab 04/21/18 2129  WBC 13.6*  NEUTROABS 4.4  HGB 10.0*  HCT 32.1*  MCV 77.2*  PLT 536*   Basic Metabolic Panel: Recent Labs  Lab 04/21/18 2129  NA 136  K 4.4  CL 106  CO2 21*  GLUCOSE 177*  BUN 25*  CREATININE 1.32*  CALCIUM 9.4   Liver Function Tests: Recent Labs  Lab 04/21/18 2129  AST 45*  ALT 23  ALKPHOS 121  BILITOT 1.3*  PROT 8.5*  ALBUMIN 4.0   Cardiac Enzymes: Recent Labs  Lab 04/21/18 2129  TROPONINI <0.03   Urine analysis:    Component Value Date/Time   COLORURINE YELLOW 04/21/2018 2128   APPEARANCEUR CLEAR 04/21/2018 2128   APPEARANCEUR Clear 06/11/2017 1232   LABSPEC 1.011 04/21/2018 2128   PHURINE 5.0 04/21/2018 2128   GLUCOSEU >=500 (A) 04/21/2018 2128   HGBUR  SMALL (A) 04/21/2018 2128   BILIRUBINUR NEGATIVE 04/21/2018 2128   BILIRUBINUR Negative 04/08/2018 0933   BILIRUBINUR Negative 06/11/2017 1232   KETONESUR NEGATIVE 04/21/2018 2128   PROTEINUR NEGATIVE 04/21/2018 2128   UROBILINOGEN 1.0 04/08/2018 0933   NITRITE NEGATIVE 04/21/2018 2128   LEUKOCYTESUR NEGATIVE 04/21/2018 2128   LEUKOCYTESUR Negative 06/11/2017 1232    Radiological Exams on Admission: Dg Chest 2 View  Result Date: 04/21/2018 CLINICAL DATA:  Syncopal episode with fall today. Diabetes and hypertension. Nonsmoker. EXAM: CHEST - 2 VIEW COMPARISON:  12/05/2017 FINDINGS: Cardiac pacemaker. Normal heart size and pulmonary vascularity. No focal airspace disease or consolidation in the lungs. No blunting of costophrenic angles. No pneumothorax. Mediastinal contours appear intact. Degenerative changes in the spine and shoulders. IMPRESSION: No active cardiopulmonary disease. Electronically Signed   By: Lucienne Capers M.D.   On: 04/21/2018 21:13   Dg Wrist Complete Left  Result Date: 04/21/2018 CLINICAL DATA:  [place] today landing on wrist. EXAM: LEFT WRIST - COMPLETE 3+ VIEW COMPARISON:  None. FINDINGS: Linear sclerosis distal radius without fracture deformity or dislocation. Severe radiocarpal joint space narrowing with periarticular sclerosis and chronic fragmentation consistent with osteoarthrosis. Severe lunocapitate osteoarthrosis. Osteopenia without destructive bony lesions. Soft tissue planes are nonsuspicious. IMPRESSION: Physeal scar distal radius, less likely nondisplaced fracture. Recommend correlation with point tenderness. Severe radiocarpal and lunocapitate osteoarthrosis. Electronically Signed   By: Elon Alas M.D.   On: 04/21/2018 16:29   Ct Head Wo Contrast  Result Date: 04/21/2018 CLINICAL DATA:  Neck and facial pain after witnessed fall. Dizziness. EXAM: CT HEAD WITHOUT CONTRAST CT MAXILLOFACIAL WITHOUT CONTRAST CT CERVICAL SPINE WITHOUT CONTRAST TECHNIQUE:  Multidetector CT imaging of the head, cervical spine, and maxillofacial structures were performed using the standard protocol without intravenous contrast. Multiplanar CT image reconstructions of the cervical spine and maxillofacial structures were also generated. COMPARISON:  CT scan of December 06, 2017. FINDINGS: CT HEAD FINDINGS Brain: Mild chronic ischemic white matter disease is noted. Old right frontal lobe infarction is noted. No mass effect or midline shift is noted. Ventricular size is within normal limits. There is no evidence of mass lesion, hemorrhage or acute infarction. Vascular: No hyperdense vessel or unexpected calcification. Skull: Normal. Negative for fracture or focal lesion. Other: None. CT MAXILLOFACIAL FINDINGS Osseous: Mildly displaced  nasal bone fracture is noted. Orbits: Negative. No traumatic or inflammatory finding. Sinuses: Clear. Soft tissues: Moderate frontal scalp hematoma is seen extending over superior portion of nose. CT CERVICAL SPINE FINDINGS Alignment: Reversal of normal lordosis is noted due to degenerative change. Grade 1 anterolisthesis of C3-4 is noted secondary to posterior facet joint hypertrophy. Skull base and vertebrae: Linear lucency seen in posterior arch of C1 which may represent nondisplaced fracture or possibly nonunion. No other fracture is noted. Soft tissues and spinal canal: No prevertebral fluid or swelling. No visible canal hematoma. Disc levels: Severe degenerative disc disease is noted at C4-5, C5-6, C6-7 and C7-T1 with anterior osteophyte formation. Upper chest: Negative. Other: Degenerative changes are seen involving posterior facet joints bilaterally. IMPRESSION: Linear lucency seen in posterior arch of C1 which is concerning for nondisplaced fracture, although congenital nonunion cannot be excluded. Mild chronic ischemic white matter disease. No acute intracranial abnormality seen. Mildly displaced nasal bone fracture is noted with overlying frontal  scalp and nasal hematoma. Severe multilevel degenerative disc disease is noted in the cervical spine. Electronically Signed   By: Marijo Conception, M.D.   On: 04/21/2018 20:04   Ct Cervical Spine Wo Contrast  Result Date: 04/21/2018 CLINICAL DATA:  Neck and facial pain after witnessed fall. Dizziness. EXAM: CT HEAD WITHOUT CONTRAST CT MAXILLOFACIAL WITHOUT CONTRAST CT CERVICAL SPINE WITHOUT CONTRAST TECHNIQUE: Multidetector CT imaging of the head, cervical spine, and maxillofacial structures were performed using the standard protocol without intravenous contrast. Multiplanar CT image reconstructions of the cervical spine and maxillofacial structures were also generated. COMPARISON:  CT scan of December 06, 2017. FINDINGS: CT HEAD FINDINGS Brain: Mild chronic ischemic white matter disease is noted. Old right frontal lobe infarction is noted. No mass effect or midline shift is noted. Ventricular size is within normal limits. There is no evidence of mass lesion, hemorrhage or acute infarction. Vascular: No hyperdense vessel or unexpected calcification. Skull: Normal. Negative for fracture or focal lesion. Other: None. CT MAXILLOFACIAL FINDINGS Osseous: Mildly displaced nasal bone fracture is noted. Orbits: Negative. No traumatic or inflammatory finding. Sinuses: Clear. Soft tissues: Moderate frontal scalp hematoma is seen extending over superior portion of nose. CT CERVICAL SPINE FINDINGS Alignment: Reversal of normal lordosis is noted due to degenerative change. Grade 1 anterolisthesis of C3-4 is noted secondary to posterior facet joint hypertrophy. Skull base and vertebrae: Linear lucency seen in posterior arch of C1 which may represent nondisplaced fracture or possibly nonunion. No other fracture is noted. Soft tissues and spinal canal: No prevertebral fluid or swelling. No visible canal hematoma. Disc levels: Severe degenerative disc disease is noted at C4-5, C5-6, C6-7 and C7-T1 with anterior osteophyte  formation. Upper chest: Negative. Other: Degenerative changes are seen involving posterior facet joints bilaterally. IMPRESSION: Linear lucency seen in posterior arch of C1 which is concerning for nondisplaced fracture, although congenital nonunion cannot be excluded. Mild chronic ischemic white matter disease. No acute intracranial abnormality seen. Mildly displaced nasal bone fracture is noted with overlying frontal scalp and nasal hematoma. Severe multilevel degenerative disc disease is noted in the cervical spine. Electronically Signed   By: Marijo Conception, M.D.   On: 04/21/2018 20:04   Ct Maxillofacial Wo Contrast  Result Date: 04/21/2018 CLINICAL DATA:  Neck and facial pain after witnessed fall. Dizziness. EXAM: CT HEAD WITHOUT CONTRAST CT MAXILLOFACIAL WITHOUT CONTRAST CT CERVICAL SPINE WITHOUT CONTRAST TECHNIQUE: Multidetector CT imaging of the head, cervical spine, and maxillofacial structures were performed using the standard protocol without intravenous  contrast. Multiplanar CT image reconstructions of the cervical spine and maxillofacial structures were also generated. COMPARISON:  CT scan of December 06, 2017. FINDINGS: CT HEAD FINDINGS Brain: Mild chronic ischemic white matter disease is noted. Old right frontal lobe infarction is noted. No mass effect or midline shift is noted. Ventricular size is within normal limits. There is no evidence of mass lesion, hemorrhage or acute infarction. Vascular: No hyperdense vessel or unexpected calcification. Skull: Normal. Negative for fracture or focal lesion. Other: None. CT MAXILLOFACIAL FINDINGS Osseous: Mildly displaced nasal bone fracture is noted. Orbits: Negative. No traumatic or inflammatory finding. Sinuses: Clear. Soft tissues: Moderate frontal scalp hematoma is seen extending over superior portion of nose. CT CERVICAL SPINE FINDINGS Alignment: Reversal of normal lordosis is noted due to degenerative change. Grade 1 anterolisthesis of C3-4 is noted  secondary to posterior facet joint hypertrophy. Skull base and vertebrae: Linear lucency seen in posterior arch of C1 which may represent nondisplaced fracture or possibly nonunion. No other fracture is noted. Soft tissues and spinal canal: No prevertebral fluid or swelling. No visible canal hematoma. Disc levels: Severe degenerative disc disease is noted at C4-5, C5-6, C6-7 and C7-T1 with anterior osteophyte formation. Upper chest: Negative. Other: Degenerative changes are seen involving posterior facet joints bilaterally. IMPRESSION: Linear lucency seen in posterior arch of C1 which is concerning for nondisplaced fracture, although congenital nonunion cannot be excluded. Mild chronic ischemic white matter disease. No acute intracranial abnormality seen. Mildly displaced nasal bone fracture is noted with overlying frontal scalp and nasal hematoma. Severe multilevel degenerative disc disease is noted in the cervical spine. Electronically Signed   By: Marijo Conception, M.D.   On: 04/21/2018 20:04    EKG: Independently reviewed.  Sinus rhythm, Q waves 2 3 aVF , V4- V6, unchanged from prior  Assessment/Plan Principal Problem:   Syncope Active Problems:   Essential hypertension   Myelofibrosis (HCC)   Sinus node dysfunction (HCC)   Sinus arrest 20 sec with syncope   S/P placement of cardiac pacemaker   Benign prostatic hyperplasia with hesitancy   Syncope- hx of orthostatic dizziness, likely etiology. Orthostatics not checked in ED. 513ms bolus given. Trop X 1 neg. EKG- unchanged. No chest pain. Chronic unchanged SOB.- with workup in the past including CTA 08/2017, stress test - low risk- 09/2017, Echo- 10/18- EF- 55-60%, G1DD.  History of sinus node dysfunction but patient has a pacemaker. - Trend trops - Will repeat echo - Carotid dopplers- as this rarely will cause syncope.  - Orthostatic vitals. -Interrogate pacemaker- care order instruction placed - Electrolytes unremarkable, will Check  Magnesium -Hold home Imdur and Norvasc for now - PT eval  Displaced cervical ( C1) and Nasal bone fracture- s/p fall.  Seen on cervical and maxillofacial CT - PT eval -Follow-up with maxillofacial surgery and neurosurgery on discharge  Sinus node dysfunction s/p placement of cardiac pacemaker-EKG sinus rhythm - Interrogate pacemaker  BPH-chronic straining and hesitancy -Continue home tamsulosin  Myelofibrosis-follows with Dr. PVirgel Manifold -Continue home ruxolitinib  DM- Hgba1c- 03/2018- 11.4. Glucose- 177. - Cont home lantus at reduced dose 20 u daily - SSi- S   DVT prophylaxis: Scds for now- nose bleeding Code Status: Full Family Communication: Son at bedside Disposition Plan: Per rounding team Consults called: None  Admission status: Obs, tele   EBethena RoysMD [facility] Pager [phone] 6PM-2AM.  Otherwise please contact night-coverage [email or web address] Password [email or web address] 04/21/2018, 11:52 PM

## 2018-04-21 NOTE — ED Triage Notes (Signed)
Per EMS, patient from home, family reports witnessed fall after loosing balance and falling forward hitting face to ground. Laceration to nose. Denies LOC and denies blood thinners. C/o bilateral shoulder pain and back pain.

## 2018-04-21 NOTE — ED Provider Notes (Signed)
[facility] DEPT Provider Note   CSN: [id number] Arrival date & time: 04/21/18  1503     History   Chief Complaint Chief Complaint  Patient presents with  . Fall    HPI Aaron Nelson is a 84 y.o. male.  HPI  Patient is an 84 year old male with a history of type of diverticulitis, hypertension, myelodysplastic syndrome, and status post pacemaker placement presenting status post fall.  History obtained with the assistance of patient's son, who witnessed the incident.  Patient reports he has had a headache for approximately 2 weeks,  was lying in bed around 2:30 PM today, when he got up to ambulate.  Patient reports he felt a little bit dizzy upon ambulation, but was able to walk down the hall.  Patient was in the kitchen where his son was present when per his son, he appeared to have a syncopal episode.  [name] denies any seizure-like activity.  No incontinence of urine or postictal state.  Patient fell face down, and subsequently had significant bleeding from the nose.  Patient is denying any visual changes, numbness or weakness of extremities, difficulty with speech, or memory difficulties at this time.  Patient is complaining of left shoulder pain, left wrist pain, and posterior neck pain.  Patient denies any chest pain or shortness of breath prior to this incident.  Patient has any chest pain or shortness breath at present.  Patient reports that he has shortness of breath at baseline, and he has been worked up significantly for several months regarding this symptom.  Patient does not take blood thinning medications.  Past Medical History:  Diagnosis Date  . Diabetes mellitus without complication ([place])   . Hypertension   . Myelodysplasia (myelodysplastic syndrome) ([place])   . S/P placement of cardiac pacemaker 08/30/2017  . Sinus arrest   . Syncope   . Syncope and collapse 08/30/2017    Patient Active Problem List   Diagnosis Date Noted  . Benign  prostatic hyperplasia with hesitancy 12/10/2017  . Hyperglycemia 12/10/2017  . Abnormal CT scan, kidney 10/15/2017  . Syncope and collapse 08/30/2017  . Sinus arrest 20 sec with syncope 08/30/2017  . S/P placement of cardiac pacemaker 08/30/2017  . Sinus node dysfunction ([place]) 08/29/2017  . Myelofibrosis ([place]) 07/24/2017  . JAK2 V617F mutation 07/24/2017  . Essential hypertension 07/15/2017  . Gait disturbance 07/15/2017  . Uncontrolled type 2 diabetes mellitus without complication, with long-term current use of insulin ([place]) 07/15/2017  . Leukopenia 07/03/2017  . Anemia 07/03/2017  . Thrombocytosis ([place]) 07/03/2017    Past Surgical History:  Procedure Laterality Date  . BONE MARROW BIOPSY  2017  . LOOP RECORDER REMOVAL N/A 08/29/2017   Procedure: LOOP RECORDER REMOVAL;  Surgeon: Deboraha Sprang, MD;  Location: [facility];  Service: Cardiovascular;  Laterality: N/A;  . PACEMAKER IMPLANT Left 08/29/2017   St Jude generator  . PACEMAKER IMPLANT N/A 08/29/2017   Procedure: Pacemaker Implant;  Surgeon: Deboraha Sprang, MD;  Location: [facility];  Service: Cardiovascular;  Laterality: N/A;  . PORTACATH PLACEMENT  2017        Home Medications    Prior to Admission medications   Medication Sig Start Date End Date Taking? Authorizing Provider  acetaminophen (TYLENOL) 325 MG tablet Take 2 tablets (650 mg total) by mouth every 4 (four) hours as needed for mild pain. 08/30/17  Yes Isaiah Serge, NP  amLODipine (NORVASC) 10 MG tablet TAKE 1 TABLET BY MOUTH  DAILY 12/11/17  Yes Deboraha Sprang, MD  aspirin EC 81 MG tablet Take 81 mg by mouth daily.   Yes [provider]  cephALEXin (KEFLEX) 500 MG capsule Take 1 capsule (500 mg total) by mouth 3 (three) times daily. 04/12/18  Yes McClung, [name] M, PA-C  Insulin Glargine (LANTUS SOLOSTAR) 100 UNIT/ML Solostar Pen Inject 50 Units into the skin every morning. 12/10/17  Yes Jegede, Marlena Clipper, MD  insulin regular  (HUMULIN R) 100 units/mL injection Inject 0.1 mLs (10 Units total) into the skin 3 (three) times daily before meals. For Blood Sugar greater than 250 12/10/17  Yes Jegede, Olugbemiga E, MD  nitrofurantoin, macrocrystal-monohydrate, (MACROBID) 100 MG capsule Take 1 capsule (100 mg total) by mouth 2 (two) times daily. 04/08/18  Yes McClung, [name] M, PA-C  NITROSTAT 0.4 MG SL tablet Place 1 tablet (0.4 mg total) under the tongue every 5 (five) minutes as needed for chest pain. 09/16/17 09/16/18 Yes Belva Crome, MD  ruxolitinib phosphate (JAKAFI) 10 MG tablet Take 1 tablet (10 mg total) by mouth 2 (two) times daily. 11/20/17  Yes Ardath Sax, MD  tamsulosin (FLOMAX) 0.4 MG CAPS capsule Take 1 capsule (0.4 mg total) by mouth daily. 04/08/18  Yes McClung, Dionne Bucy, PA-C  ACCU-CHEK FASTCLIX LANCETS MISC Use as directed 3 times daily. E11.9 11/20/17   Tresa Garter, MD  Blood Glucose Monitoring Suppl (ACCU-CHEK AVIVA PLUS) w/Device KIT Use as directed 3 times daily. E11.9 11/20/17   Angelica Chessman E, MD  glucose blood (ACCU-CHEK AVIVA PLUS) test strip Use as directed 3 times daily. E11.9 04/08/18   Argentina Donovan, PA-C  Insulin Pen Needle 31G X 5 MM MISC Use as directed 04/08/18   Argentina Donovan, PA-C  isosorbide mononitrate (IMDUR) 30 MG 24 hr tablet Take 1 tablet (30 mg total) by mouth daily. Patient not taking: Reported on 04/08/2018 12/10/17   Tresa Garter, MD  lidocaine (XYLOCAINE) 2 % solution Apply small amount to painful gums with a qtip as needed.  Avoid swallowing Patient not taking: Reported on 04/08/2018 08/13/17   Argentina Donovan, PA-C    Family History Family History  Problem Relation Age of Onset  . Hypertension Mother     Social History Social History   Tobacco Use  . Smoking status: Never Smoker  . Smokeless tobacco: Never Used  Substance Use Topics  . Alcohol use: No  . Drug use: No     Allergies   Patient has no known allergies.   Review of  Systems Review of Systems  Constitutional: Negative for chills and fever.  HENT: Negative for congestion and rhinorrhea.        +Nasal pain  Eyes: Negative for visual disturbance.  Respiratory: Negative for shortness of breath.   Cardiovascular: Negative for chest pain and palpitations.  Gastrointestinal: Negative for abdominal pain, nausea and vomiting.  Genitourinary: Negative for dysuria.  Musculoskeletal: Positive for arthralgias. Negative for back pain.  Skin: Negative for rash.  Neurological: Positive for dizziness, syncope, light-headedness and headaches. Negative for seizures, speech difficulty, weakness and numbness.     Physical Exam Updated Vital Signs BP 116/71   Pulse 71   Temp 97.9 F (36.6 C) (Oral)   Resp 17   SpO2 99%   Physical Exam  Constitutional: He appears well-developed and well-nourished. No distress.  HENT:  Head: Normocephalic and atraumatic.  Mouth/Throat: Oropharynx is clear and moist.  Ecchymosis over nasal bridge. Deformity noted over nasal bridge.  No hemotympanum. No battle sign.  Eyes: Pupils are equal, round, and reactive to light. Conjunctivae and EOM are normal.  Neck: Normal range of motion. Neck supple.  Cardiovascular: Normal rate, regular rhythm, S1 normal and S2 normal.  No murmur heard. Pulmonary/Chest: Effort normal and breath sounds normal. He has no wheezes. He has no rales.  Abdominal: Soft. He exhibits no distension. There is no tenderness. There is no guarding.  Musculoskeletal: Normal range of motion.  PALPATION: Patient exhibits midline spine tenderness of the cervical spine and upper cervical spine.  Appears to be also loss of lordosis. ROM of cervical spine not assessed due to fracture concern.  C-collar in place. MOTOR: Increased tone.  5/5 strength b/l with resisted shoulder abduction/adduction, biceps flexion (C5/6), biceps extension (C6-C8), wrist flexion, wrist extension (C6-C8), and grip strength (C7-T1) DTRs difficult  to elicit, but no evidence of hyperreflexia bilaterally. SENSORY: Sensation is intact to light touch in:  Superficial radial nerve distribution (dorsal first web space) Median nerve distribution (tip of index finger)   Ulnar nerve distribution (tip of small finger)   Lymphadenopathy:    He has no cervical adenopathy.  Neurological: He is alert.  Mental Status:  Alert, oriented, thought content appropriate, able to give a coherent history. Speech fluent without evidence of aphasia. Able to follow 2 step commands without difficulty.  Cranial Nerves:  II:  Peripheral visual fields grossly normal, pupils equal, round, reactive to light III,IV, VI: ptosis not present, extra-ocular motions intact bilaterally  V,VII: smile symmetric, facial light touch sensation equal VIII: hearing grossly normal to voice  X: uvula elevates symmetrically  XI: bilateral shoulder shrug symmetric and strong XII: midline tongue extension without fassiculations Motor:  Slightly increased tone. 5/5 in upper and lower extremities bilaterally including strong and equal grip strength and dorsiflexion/plantar flexion Sensory: Pinprick and light touch normal in all extremities.  Deep Tendon Reflexes: 2+ and symmetric in the patella. Unable to elicit in biceps. Cerebellar: normal finger-to-nose with bilateral upper extremities Gait: Deferred at this time.  Stance: No pronator drift and good coordination, strength, and position sense with tapping of bilateral arms (performed in sitting position). CV: distal pulses palpable throughout   Skin: Skin is warm and dry. No rash noted. No erythema.  Psychiatric: He has a normal mood and affect. His behavior is normal. Judgment and thought content normal.  Nursing note and vitals reviewed.    ED Treatments / Results  Labs (all labs ordered are listed, but only abnormal results are displayed) Labs Reviewed  URINE CULTURE  COMPREHENSIVE METABOLIC PANEL  CBC WITH  DIFFERENTIAL/PLATELET  TROPONIN I  URINALYSIS, ROUTINE W REFLEX MICROSCOPIC    EKG EKG Interpretation  Date/Time:  Tuesday April 21 2018 20:45:42 EDT Ventricular Rate:  70 PR Interval:    QRS Duration: 91 QT Interval:  412 QTC Calculation: 445 R Axis:   54 Text Interpretation:  Sinus rhythm LVH with secondary repolarization abnormality Baseline wander in lead(s) V4 similar pattern to prior 12/18 Confirmed by Aletta Edouard [phone]) on 04/21/2018 9:00:41 PM   Radiology Dg Wrist Complete Left  Result Date: 04/21/2018 CLINICAL DATA:  [place] today landing on wrist. EXAM: LEFT WRIST - COMPLETE 3+ VIEW COMPARISON:  None. FINDINGS: Linear sclerosis distal radius without fracture deformity or dislocation. Severe radiocarpal joint space narrowing with periarticular sclerosis and chronic fragmentation consistent with osteoarthrosis. Severe lunocapitate osteoarthrosis. Osteopenia without destructive bony lesions. Soft tissue planes are nonsuspicious. IMPRESSION: Physeal scar distal radius, less likely nondisplaced fracture. Recommend correlation with  point tenderness. Severe radiocarpal and lunocapitate osteoarthrosis. Electronically Signed   By: Elon Alas M.D.   On: 04/21/2018 16:29   Ct Head Wo Contrast  Result Date: 04/21/2018 CLINICAL DATA:  Neck and facial pain after witnessed fall. Dizziness. EXAM: CT HEAD WITHOUT CONTRAST CT MAXILLOFACIAL WITHOUT CONTRAST CT CERVICAL SPINE WITHOUT CONTRAST TECHNIQUE: Multidetector CT imaging of the head, cervical spine, and maxillofacial structures were performed using the standard protocol without intravenous contrast. Multiplanar CT image reconstructions of the cervical spine and maxillofacial structures were also generated. COMPARISON:  CT scan of December 06, 2017. FINDINGS: CT HEAD FINDINGS Brain: Mild chronic ischemic white matter disease is noted. Old right frontal lobe infarction is noted. No mass effect or midline shift is noted. Ventricular size  is within normal limits. There is no evidence of mass lesion, hemorrhage or acute infarction. Vascular: No hyperdense vessel or unexpected calcification. Skull: Normal. Negative for fracture or focal lesion. Other: None. CT MAXILLOFACIAL FINDINGS Osseous: Mildly displaced nasal bone fracture is noted. Orbits: Negative. No traumatic or inflammatory finding. Sinuses: Clear. Soft tissues: Moderate frontal scalp hematoma is seen extending over superior portion of nose. CT CERVICAL SPINE FINDINGS Alignment: Reversal of normal lordosis is noted due to degenerative change. Grade 1 anterolisthesis of C3-4 is noted secondary to posterior facet joint hypertrophy. Skull base and vertebrae: Linear lucency seen in posterior arch of C1 which may represent nondisplaced fracture or possibly nonunion. No other fracture is noted. Soft tissues and spinal canal: No prevertebral fluid or swelling. No visible canal hematoma. Disc levels: Severe degenerative disc disease is noted at C4-5, C5-6, C6-7 and C7-T1 with anterior osteophyte formation. Upper chest: Negative. Other: Degenerative changes are seen involving posterior facet joints bilaterally. IMPRESSION: Linear lucency seen in posterior arch of C1 which is concerning for nondisplaced fracture, although congenital nonunion cannot be excluded. Mild chronic ischemic white matter disease. No acute intracranial abnormality seen. Mildly displaced nasal bone fracture is noted with overlying frontal scalp and nasal hematoma. Severe multilevel degenerative disc disease is noted in the cervical spine. Electronically Signed   By: Marijo Conception, M.D.   On: 04/21/2018 20:04   Ct Cervical Spine Wo Contrast  Result Date: 04/21/2018 CLINICAL DATA:  Neck and facial pain after witnessed fall. Dizziness. EXAM: CT HEAD WITHOUT CONTRAST CT MAXILLOFACIAL WITHOUT CONTRAST CT CERVICAL SPINE WITHOUT CONTRAST TECHNIQUE: Multidetector CT imaging of the head, cervical spine, and maxillofacial structures  were performed using the standard protocol without intravenous contrast. Multiplanar CT image reconstructions of the cervical spine and maxillofacial structures were also generated. COMPARISON:  CT scan of December 06, 2017. FINDINGS: CT HEAD FINDINGS Brain: Mild chronic ischemic white matter disease is noted. Old right frontal lobe infarction is noted. No mass effect or midline shift is noted. Ventricular size is within normal limits. There is no evidence of mass lesion, hemorrhage or acute infarction. Vascular: No hyperdense vessel or unexpected calcification. Skull: Normal. Negative for fracture or focal lesion. Other: None. CT MAXILLOFACIAL FINDINGS Osseous: Mildly displaced nasal bone fracture is noted. Orbits: Negative. No traumatic or inflammatory finding. Sinuses: Clear. Soft tissues: Moderate frontal scalp hematoma is seen extending over superior portion of nose. CT CERVICAL SPINE FINDINGS Alignment: Reversal of normal lordosis is noted due to degenerative change. Grade 1 anterolisthesis of C3-4 is noted secondary to posterior facet joint hypertrophy. Skull base and vertebrae: Linear lucency seen in posterior arch of C1 which may represent nondisplaced fracture or possibly nonunion. No other fracture is noted. Soft tissues and spinal canal:  No prevertebral fluid or swelling. No visible canal hematoma. Disc levels: Severe degenerative disc disease is noted at C4-5, C5-6, C6-7 and C7-T1 with anterior osteophyte formation. Upper chest: Negative. Other: Degenerative changes are seen involving posterior facet joints bilaterally. IMPRESSION: Linear lucency seen in posterior arch of C1 which is concerning for nondisplaced fracture, although congenital nonunion cannot be excluded. Mild chronic ischemic white matter disease. No acute intracranial abnormality seen. Mildly displaced nasal bone fracture is noted with overlying frontal scalp and nasal hematoma. Severe multilevel degenerative disc disease is noted in the  cervical spine. Electronically Signed   By: Marijo Conception, M.D.   On: 04/21/2018 20:04   Ct Maxillofacial Wo Contrast  Result Date: 04/21/2018 CLINICAL DATA:  Neck and facial pain after witnessed fall. Dizziness. EXAM: CT HEAD WITHOUT CONTRAST CT MAXILLOFACIAL WITHOUT CONTRAST CT CERVICAL SPINE WITHOUT CONTRAST TECHNIQUE: Multidetector CT imaging of the head, cervical spine, and maxillofacial structures were performed using the standard protocol without intravenous contrast. Multiplanar CT image reconstructions of the cervical spine and maxillofacial structures were also generated. COMPARISON:  CT scan of December 06, 2017. FINDINGS: CT HEAD FINDINGS Brain: Mild chronic ischemic white matter disease is noted. Old right frontal lobe infarction is noted. No mass effect or midline shift is noted. Ventricular size is within normal limits. There is no evidence of mass lesion, hemorrhage or acute infarction. Vascular: No hyperdense vessel or unexpected calcification. Skull: Normal. Negative for fracture or focal lesion. Other: None. CT MAXILLOFACIAL FINDINGS Osseous: Mildly displaced nasal bone fracture is noted. Orbits: Negative. No traumatic or inflammatory finding. Sinuses: Clear. Soft tissues: Moderate frontal scalp hematoma is seen extending over superior portion of nose. CT CERVICAL SPINE FINDINGS Alignment: Reversal of normal lordosis is noted due to degenerative change. Grade 1 anterolisthesis of C3-4 is noted secondary to posterior facet joint hypertrophy. Skull base and vertebrae: Linear lucency seen in posterior arch of C1 which may represent nondisplaced fracture or possibly nonunion. No other fracture is noted. Soft tissues and spinal canal: No prevertebral fluid or swelling. No visible canal hematoma. Disc levels: Severe degenerative disc disease is noted at C4-5, C5-6, C6-7 and C7-T1 with anterior osteophyte formation. Upper chest: Negative. Other: Degenerative changes are seen involving posterior  facet joints bilaterally. IMPRESSION: Linear lucency seen in posterior arch of C1 which is concerning for nondisplaced fracture, although congenital nonunion cannot be excluded. Mild chronic ischemic white matter disease. No acute intracranial abnormality seen. Mildly displaced nasal bone fracture is noted with overlying frontal scalp and nasal hematoma. Severe multilevel degenerative disc disease is noted in the cervical spine. Electronically Signed   By: Marijo Conception, M.D.   On: 04/21/2018 20:04    Procedures Procedures (including critical care time)  Medications Ordered in ED Medications - No data to display   Initial Impression / Assessment and Plan / ED Course  I have reviewed the triage vital signs and the nursing notes.  Pertinent labs & imaging results that were available during my care of the patient were reviewed by me and considered in my medical decision making (see chart for details).  Clinical Course as of Apr 23 155  Tue Apr 21, 4828  [id number] 84 year old male who had a witnessed fall/syncopal event at home.  He struck his face.  He is well-appearing and not complaining of any chest pain or abdominal pain.  He is got a small laceration over the bridge of his nose.  By CT there is some concern of a possible C1 fracture  versus nonunion.  He is getting some labs EKG chest x-ray and will likely need to be admitted to the hospital for syncope work-up.  Neurosurgery will also need to be contacted regarding the C-spine.  Is placed in a hard collar.   [MB]  2141 Spoke with Dr. Vertell Limber of neurosurgery at [facility] who recommends that patient be placed in C-collar and does not need further intervention at this time besides c-collar.  This will be an outpatient follow-up.  Also does not suspect vascular injury based on this fracture.   [AM]    Clinical Course User Index [AM] Albesa Seen, PA-C [MB] Hayden Rasmussen, MD    Patient well-appearing and  neurologically intact.  Patient with likely syncopal episode by history.  Patient is pacemaker dependent.  Initiate syncope work-up including troponin, EKG, chest x-ray, CBC, BMP.  Additionally, CT scan of the cervical spine demonstrates linear lucency of posterior arch of C1 concerning for nondisplaced fracture.  Will consult neurosurgery.  Clinically correlates.  Will discuss concern for vascular injury when speaking with neurosurgery.  No evidence of intracranial hemorrhage.  Creatinine stable.  See baseline.  BUN slightly elevated at 25.  Will provide soft fluid hydration with 500 mL normal saline.  Hemoglobin is stable and slightly elevated per historical baseline. Patient does have a low platelet count of 114, and it was 214 in February 2019.  Urinalysis without evidence of infection.  EKG demonstrating left ventricular hypertrophy, but no signs of new ischemia, arrhythmia, or infarction.  Lab Results  Component Value Date   CREATININE 1.32 (H) 04/21/2018   CREATININE 1.78 (H) 03/02/2018   CREATININE 1.62 (H) 01/29/2018   We will admit for syncope work-up.  Pacemaker not interrogated in ED.    Patient follow-up with Dr.Stern of neurosurgery on discharge, with Aspen collar in place.  Patient to follow-up with Dr. Janace Hoard of ENT regarding mildly displaced nasal bone fracture.  Emergency department nasal care performed with rinsing small amounts of saline through the nose, as patient did not blow nose for 2 to 3 weeks, and avoid any activities with possible contact to the face for 2 to 3 weeks.  Case discussed with Dr. Denton Brick of [facility] who will admit the patient for syncope workup.  This is a shared visit with Dr. Aletta Edouard. Patient was independently evaluated by this attending physician. Attending physician consulted in evaluation and admission.  Final Clinical Impressions(s) / ED Diagnoses   Final diagnoses:  Syncope, unspecified syncope type  Closed nondisplaced fracture of  first cervical vertebra, unspecified fracture morphology, initial encounter ([place])  Closed fracture of nasal bone, initial encounter  Thrombocytopenia [facility])  Microcytic anemia    ED Discharge Orders    None       Tamala Julian 04/22/18 0159    Hayden Rasmussen, MD 04/22/18 1754

## 2018-04-22 ENCOUNTER — Other Ambulatory Visit: Payer: Self-pay

## 2018-04-22 ENCOUNTER — Observation Stay (HOSPITAL_BASED_OUTPATIENT_CLINIC_OR_DEPARTMENT_OTHER): Payer: Medicare Other

## 2018-04-22 DIAGNOSIS — I10 Essential (primary) hypertension: Secondary | ICD-10-CM

## 2018-04-22 DIAGNOSIS — D7581 Myelofibrosis: Secondary | ICD-10-CM

## 2018-04-22 DIAGNOSIS — R55 Syncope and collapse: Secondary | ICD-10-CM | POA: Diagnosis not present

## 2018-04-22 DIAGNOSIS — Z950 Presence of cardiac pacemaker: Secondary | ICD-10-CM

## 2018-04-22 DIAGNOSIS — S022XXA Fracture of nasal bones, initial encounter for closed fracture: Secondary | ICD-10-CM | POA: Diagnosis not present

## 2018-04-22 DIAGNOSIS — R42 Dizziness and giddiness: Secondary | ICD-10-CM

## 2018-04-22 DIAGNOSIS — I455 Other specified heart block: Secondary | ICD-10-CM | POA: Diagnosis not present

## 2018-04-22 LAB — MAGNESIUM: MAGNESIUM: 1.9 mg/dL (ref 1.7–2.4)

## 2018-04-22 LAB — CBC
HCT: 30.6 % — ABNORMAL LOW (ref 39.0–52.0)
Hemoglobin: 9.4 g/dL — ABNORMAL LOW (ref 13.0–17.0)
MCH: 24.2 pg — AB (ref 26.0–34.0)
MCHC: 30.7 g/dL (ref 30.0–36.0)
MCV: 78.9 fL (ref 78.0–100.0)
PLATELETS: 96 10*3/uL — AB (ref 150–400)
RBC: 3.88 MIL/uL — ABNORMAL LOW (ref 4.22–5.81)
RDW: 23.7 % — AB (ref 11.5–15.5)
WBC: 11.8 10*3/uL — AB (ref 4.0–10.5)

## 2018-04-22 LAB — BASIC METABOLIC PANEL
Anion gap: 9 (ref 5–15)
BUN: 20 mg/dL (ref 6–20)
CHLORIDE: 106 mmol/L (ref 101–111)
CO2: 22 mmol/L (ref 22–32)
CREATININE: 1.2 mg/dL (ref 0.61–1.24)
Calcium: 8.7 mg/dL — ABNORMAL LOW (ref 8.9–10.3)
GFR calc Af Amer: >60 mL/min (ref >60)
GFR calc non Af Amer: 53 mL/min — ABNORMAL LOW (ref >60)
GLUCOSE: 158 mg/dL — AB (ref 65–99)
Potassium: 4.1 mmol/L (ref 3.5–5.1)
SODIUM: 137 mmol/L (ref 135–145)

## 2018-04-22 LAB — GLUCOSE, CAPILLARY
GLUCOSE-CAPILLARY: 86 mg/dL (ref 65–99)
GLUCOSE-CAPILLARY: 159 mg/dL — AB (ref 65–99)
Glucose-Capillary: 76 mg/dL (ref 65–99)
Glucose-Capillary: 154 mg/dL — ABNORMAL HIGH (ref 65–99)
Glucose-Capillary: 156 mg/dL — ABNORMAL HIGH (ref 65–99)

## 2018-04-22 LAB — TROPONIN I: Troponin I: <0.03 ng/mL (ref <0.03)

## 2018-04-22 MED ORDER — INSULIN ASPART 100 UNIT/ML ~~LOC~~ SOLN
0.0000 [IU] | Freq: Three times a day (TID) | SUBCUTANEOUS | Status: DC
  Administered 2018-04-22 (×2): 2 [IU] via SUBCUTANEOUS
  Administered 2018-04-23: 5 [IU] via SUBCUTANEOUS
  Administered 2018-04-23: 2 [IU] via SUBCUTANEOUS

## 2018-04-22 MED ORDER — TAMSULOSIN HCL 0.4 MG PO CAPS
0.4000 mg | ORAL_CAPSULE | Freq: Every day | ORAL | Status: DC
  Administered 2018-04-22 – 2018-04-23 (×2): 0.4 mg via ORAL
  Filled 2018-04-22 (×2): qty 1

## 2018-04-22 MED ORDER — ASPIRIN EC 81 MG PO TBEC
81.0000 mg | DELAYED_RELEASE_TABLET | Freq: Every day | ORAL | Status: DC
  Administered 2018-04-22 – 2018-04-23 (×2): 81 mg via ORAL
  Filled 2018-04-22 (×2): qty 1

## 2018-04-22 MED ORDER — INSULIN GLARGINE 100 UNIT/ML ~~LOC~~ SOLN
20.0000 [IU] | Freq: Every day | SUBCUTANEOUS | Status: DC
  Administered 2018-04-22 – 2018-04-23 (×2): 20 [IU] via SUBCUTANEOUS
  Filled 2018-04-22 (×2): qty 0.2

## 2018-04-22 MED ORDER — PHENYLEPHRINE HCL 1 % NA SOLN
2.0000 [drp] | Freq: Once | NASAL | Status: AC
  Administered 2018-04-22: 2 [drp] via NASAL
  Filled 2018-04-22: qty 15

## 2018-04-22 MED ORDER — RUXOLITINIB PHOSPHATE 10 MG PO TABS: 10.0000 mg | ORAL_TABLET | Freq: Two times a day (BID) | ORAL | Status: DC

## 2018-04-22 MED ORDER — TRAMADOL HCL 50 MG PO TABS
50.0000 mg | ORAL_TABLET | Freq: Four times a day (QID) | ORAL | Status: DC | PRN
  Administered 2018-04-22 (×2): 50 mg via ORAL
  Filled 2018-04-22 (×2): qty 1

## 2018-04-22 NOTE — ED Notes (Signed)
This nurse and charge could not locate the device to irrigate pts specific pacemaker

## 2018-04-22 NOTE — ED Notes (Signed)
ED TO INPATIENT HANDOFF REPORT  Name/Age/Gender Aaron Nelson 84 y.o. male  Code Status Code Status History    Date Active Date Inactive Code Status Order ID Comments User Context   08/29/2017 1542 08/30/2017 1716 Full Code [id number]  Deboraha Sprang, MD Inpatient      Home/SNF/Other Home  Chief Complaint Fall   Level of Care/Admitting Diagnosis ED Disposition    ED Disposition Condition [place]: [facility] [[id number]]  Level of Care: Telemetry [5]  Admit to tele based on following criteria: Other see comments  Comments: Syncope  Diagnosis: Syncope [206001]  Admitting Physician: Bethena Roys [[id number]]  Attending Physician: Bethena Roys Nessa.Cuff  PT Class (Do Not Modify): Observation [104]  PT Acc Code (Do Not Modify): Observation [10022]       Medical History Past Medical History:  Diagnosis Date  . Diabetes mellitus without complication ([place])   . Hypertension   . Myelodysplasia (myelodysplastic syndrome) ([place])   . S/P placement of cardiac pacemaker 08/30/2017  . Sinus arrest   . Syncope   . Syncope and collapse 08/30/2017    Allergies No Known Allergies  IV Location/Drains/Wounds Patient Lines/Drains/Airways Status   Active Line/Drains/Airways    None          Labs/Imaging Results for orders placed or performed during the hospital encounter of 04/21/18 (from the past 48 hour(s))  Urinalysis, Routine w reflex microscopic     Status: Abnormal   Collection Time: 04/21/18  9:28 PM  Result Value Ref Range   Color, Urine YELLOW YELLOW   APPearance CLEAR CLEAR   Specific Gravity, Urine 1.011 1.005 - 1.030   pH 5.0 5.0 - 8.0   Glucose, UA >=500 (A) NEGATIVE mg/dL   Hgb urine dipstick SMALL (A) NEGATIVE   Bilirubin Urine NEGATIVE NEGATIVE   Ketones, ur NEGATIVE NEGATIVE mg/dL   Protein, ur NEGATIVE NEGATIVE mg/dL   Nitrite NEGATIVE NEGATIVE   Leukocytes, UA NEGATIVE NEGATIVE   RBC / HPF 0-5 0 - 5 RBC/hpf    WBC, UA 0-5 0 - 5 WBC/hpf   Bacteria, UA NONE SEEN NONE SEEN   Squamous Epithelial / LPF 6-10 0 - 5    Comment: Please note change in reference range.   Mucus PRESENT    Hyaline Casts, UA PRESENT     Comment: Performed at [facility], [place] [address], [place], [place] [address]  Comprehensive metabolic panel     Status: Abnormal   Collection Time: 04/21/18  9:29 PM  Result Value Ref Range   Sodium 136 135 - 145 mmol/L   Potassium 4.4 3.5 - 5.1 mmol/L   Chloride 106 101 - 111 mmol/L   CO2 21 (L) 22 - 32 mmol/L   Glucose, Bld 177 (H) 65 - 99 mg/dL   BUN 25 (H) 6 - 20 mg/dL   Creatinine, Ser 1.32 (H) 0.61 - 1.24 mg/dL   Calcium 9.4 8.9 - 10.3 mg/dL   Total Protein 8.5 (H) 6.5 - 8.1 g/dL   Albumin 4.0 3.5 - 5.0 g/dL   AST 45 (H) 15 - 41 U/L   ALT 23 17 - 63 U/L   Alkaline Phosphatase 121 38 - 126 U/L   Total Bilirubin 1.3 (H) 0.3 - 1.2 mg/dL   GFR calc non Af Amer 47 (L) >60 mL/min   GFR calc Af Amer 55 (L) >60 mL/min    Comment: (NOTE) The eGFR has been calculated using the CKD  EPI equation. This calculation has not been validated in all clinical situations. eGFR's persistently <60 mL/min signify possible Chronic Kidney Disease.    Anion gap 9 5 - 15    Comment: Performed at [facility], [place] [address] [place], [place] [address]  CBC with Differential     Status: Abnormal   Collection Time: 04/21/18  9:29 PM  Result Value Ref Range   WBC 13.6 (H) 4.0 - 10.5 K/uL    Comment: WHITE COUNT CONFIRMED ON SMEAR ADJUSTED FOR NUCLEATED RBC'S    RBC 4.16 (L) 4.22 - 5.81 MIL/uL   Hemoglobin 10.0 (L) 13.0 - 17.0 g/dL   HCT 32.1 (L) 39.0 - 52.0 %   MCV 77.2 (L) 78.0 - 100.0 fL   MCH 24.0 (L) 26.0 - 34.0 pg   MCHC 31.2 30.0 - 36.0 g/dL   RDW 23.8 (H) 11.5 - 15.5 %   Platelets 114 (L) 150 - 400 K/uL    Comment: REPEATED TO VERIFY SPECIMEN CHECKED FOR CLOTS PLATELET COUNT CONFIRMED BY SMEAR    Neutrophils Relative % 32 %   Lymphocytes  Relative 28 %   Monocytes Relative 25 %   Eosinophils Relative 1 %   Basophils Relative 14 %   Band Neutrophils 0 %   Metamyelocytes Relative 0 %   Myelocytes 0 %   Promyelocytes Relative 0 %   Blasts 0 %   nRBC 0 0 /100 WBC   Other 0 %   Neutro Abs 4.4 1.7 - 7.7 K/uL   Lymphs Abs 3.8 0.7 - 4.0 K/uL   Monocytes Absolute 3.4 (H) 0.1 - 1.0 K/uL   Eosinophils Absolute 0.1 0.0 - 0.7 K/uL   Basophils Absolute 1.9 (H) 0.0 - 0.1 K/uL    Comment: Performed at [facility] [place], [place] [address] [place], [place] [address]  Troponin I     Status: None   Collection Time: 04/21/18  9:29 PM  Result Value Ref Range   Troponin I <0.03 <0.03 ng/mL    Comment: Performed at [facility], [place] [address], [place], [place] [address]   Dg Chest 2 View  Result Date: 04/21/2018 CLINICAL DATA:  Syncopal episode with fall today. Diabetes and hypertension. Nonsmoker. EXAM: CHEST - 2 VIEW COMPARISON:  12/05/2017 FINDINGS: Cardiac pacemaker. Normal heart size and pulmonary vascularity. No focal airspace disease or consolidation in the lungs. No blunting of costophrenic angles. No pneumothorax. Mediastinal contours appear intact. Degenerative changes in the spine and shoulders. IMPRESSION: No active cardiopulmonary disease. Electronically Signed   By: Lucienne Capers M.D.   On: 04/21/2018 21:13   Dg Wrist Complete Left  Result Date: 04/21/2018 CLINICAL DATA:  [place] today landing on wrist. EXAM: LEFT WRIST - COMPLETE 3+ VIEW COMPARISON:  None. FINDINGS: Linear sclerosis distal radius without fracture deformity or dislocation. Severe radiocarpal joint space narrowing with periarticular sclerosis and chronic fragmentation consistent with osteoarthrosis. Severe lunocapitate osteoarthrosis. Osteopenia without destructive bony lesions. Soft tissue planes are nonsuspicious. IMPRESSION: Physeal scar distal radius, less likely nondisplaced fracture. Recommend correlation with point tenderness.  Severe radiocarpal and lunocapitate osteoarthrosis. Electronically Signed   By: Elon Alas M.D.   On: 04/21/2018 16:29   Ct Head Wo Contrast  Result Date: 04/21/2018 CLINICAL DATA:  Neck and facial pain after witnessed fall. Dizziness. EXAM: CT HEAD WITHOUT CONTRAST CT MAXILLOFACIAL WITHOUT CONTRAST CT CERVICAL SPINE WITHOUT CONTRAST TECHNIQUE: Multidetector CT imaging of the head, cervical spine, and maxillofacial structures were performed using the standard protocol  without intravenous contrast. Multiplanar CT image reconstructions of the cervical spine and maxillofacial structures were also generated. COMPARISON:  CT scan of December 06, 2017. FINDINGS: CT HEAD FINDINGS Brain: Mild chronic ischemic white matter disease is noted. Old right frontal lobe infarction is noted. No mass effect or midline shift is noted. Ventricular size is within normal limits. There is no evidence of mass lesion, hemorrhage or acute infarction. Vascular: No hyperdense vessel or unexpected calcification. Skull: Normal. Negative for fracture or focal lesion. Other: None. CT MAXILLOFACIAL FINDINGS Osseous: Mildly displaced nasal bone fracture is noted. Orbits: Negative. No traumatic or inflammatory finding. Sinuses: Clear. Soft tissues: Moderate frontal scalp hematoma is seen extending over superior portion of nose. CT CERVICAL SPINE FINDINGS Alignment: Reversal of normal lordosis is noted due to degenerative change. Grade 1 anterolisthesis of C3-4 is noted secondary to posterior facet joint hypertrophy. Skull base and vertebrae: Linear lucency seen in posterior arch of C1 which may represent nondisplaced fracture or possibly nonunion. No other fracture is noted. Soft tissues and spinal canal: No prevertebral fluid or swelling. No visible canal hematoma. Disc levels: Severe degenerative disc disease is noted at C4-5, C5-6, C6-7 and C7-T1 with anterior osteophyte formation. Upper chest: Negative. Other: Degenerative changes are  seen involving posterior facet joints bilaterally. IMPRESSION: Linear lucency seen in posterior arch of C1 which is concerning for nondisplaced fracture, although congenital nonunion cannot be excluded. Mild chronic ischemic white matter disease. No acute intracranial abnormality seen. Mildly displaced nasal bone fracture is noted with overlying frontal scalp and nasal hematoma. Severe multilevel degenerative disc disease is noted in the cervical spine. Electronically Signed   By: Marijo Conception, M.D.   On: 04/21/2018 20:04   Ct Cervical Spine Wo Contrast  Result Date: 04/21/2018 CLINICAL DATA:  Neck and facial pain after witnessed fall. Dizziness. EXAM: CT HEAD WITHOUT CONTRAST CT MAXILLOFACIAL WITHOUT CONTRAST CT CERVICAL SPINE WITHOUT CONTRAST TECHNIQUE: Multidetector CT imaging of the head, cervical spine, and maxillofacial structures were performed using the standard protocol without intravenous contrast. Multiplanar CT image reconstructions of the cervical spine and maxillofacial structures were also generated. COMPARISON:  CT scan of December 06, 2017. FINDINGS: CT HEAD FINDINGS Brain: Mild chronic ischemic white matter disease is noted. Old right frontal lobe infarction is noted. No mass effect or midline shift is noted. Ventricular size is within normal limits. There is no evidence of mass lesion, hemorrhage or acute infarction. Vascular: No hyperdense vessel or unexpected calcification. Skull: Normal. Negative for fracture or focal lesion. Other: None. CT MAXILLOFACIAL FINDINGS Osseous: Mildly displaced nasal bone fracture is noted. Orbits: Negative. No traumatic or inflammatory finding. Sinuses: Clear. Soft tissues: Moderate frontal scalp hematoma is seen extending over superior portion of nose. CT CERVICAL SPINE FINDINGS Alignment: Reversal of normal lordosis is noted due to degenerative change. Grade 1 anterolisthesis of C3-4 is noted secondary to posterior facet joint hypertrophy. Skull base and  vertebrae: Linear lucency seen in posterior arch of C1 which may represent nondisplaced fracture or possibly nonunion. No other fracture is noted. Soft tissues and spinal canal: No prevertebral fluid or swelling. No visible canal hematoma. Disc levels: Severe degenerative disc disease is noted at C4-5, C5-6, C6-7 and C7-T1 with anterior osteophyte formation. Upper chest: Negative. Other: Degenerative changes are seen involving posterior facet joints bilaterally. IMPRESSION: Linear lucency seen in posterior arch of C1 which is concerning for nondisplaced fracture, although congenital nonunion cannot be excluded. Mild chronic ischemic white matter disease. No acute intracranial abnormality seen. Mildly displaced  nasal bone fracture is noted with overlying frontal scalp and nasal hematoma. Severe multilevel degenerative disc disease is noted in the cervical spine. Electronically Signed   By: Marijo Conception, M.D.   On: 04/21/2018 20:04   Ct Maxillofacial Wo Contrast  Result Date: 04/21/2018 CLINICAL DATA:  Neck and facial pain after witnessed fall. Dizziness. EXAM: CT HEAD WITHOUT CONTRAST CT MAXILLOFACIAL WITHOUT CONTRAST CT CERVICAL SPINE WITHOUT CONTRAST TECHNIQUE: Multidetector CT imaging of the head, cervical spine, and maxillofacial structures were performed using the standard protocol without intravenous contrast. Multiplanar CT image reconstructions of the cervical spine and maxillofacial structures were also generated. COMPARISON:  CT scan of December 06, 2017. FINDINGS: CT HEAD FINDINGS Brain: Mild chronic ischemic white matter disease is noted. Old right frontal lobe infarction is noted. No mass effect or midline shift is noted. Ventricular size is within normal limits. There is no evidence of mass lesion, hemorrhage or acute infarction. Vascular: No hyperdense vessel or unexpected calcification. Skull: Normal. Negative for fracture or focal lesion. Other: None. CT MAXILLOFACIAL FINDINGS Osseous: Mildly  displaced nasal bone fracture is noted. Orbits: Negative. No traumatic or inflammatory finding. Sinuses: Clear. Soft tissues: Moderate frontal scalp hematoma is seen extending over superior portion of nose. CT CERVICAL SPINE FINDINGS Alignment: Reversal of normal lordosis is noted due to degenerative change. Grade 1 anterolisthesis of C3-4 is noted secondary to posterior facet joint hypertrophy. Skull base and vertebrae: Linear lucency seen in posterior arch of C1 which may represent nondisplaced fracture or possibly nonunion. No other fracture is noted. Soft tissues and spinal canal: No prevertebral fluid or swelling. No visible canal hematoma. Disc levels: Severe degenerative disc disease is noted at C4-5, C5-6, C6-7 and C7-T1 with anterior osteophyte formation. Upper chest: Negative. Other: Degenerative changes are seen involving posterior facet joints bilaterally. IMPRESSION: Linear lucency seen in posterior arch of C1 which is concerning for nondisplaced fracture, although congenital nonunion cannot be excluded. Mild chronic ischemic white matter disease. No acute intracranial abnormality seen. Mildly displaced nasal bone fracture is noted with overlying frontal scalp and nasal hematoma. Severe multilevel degenerative disc disease is noted in the cervical spine. Electronically Signed   By: Marijo Conception, M.D.   On: 04/21/2018 20:04    Pending Labs Unresulted Labs (From admission, onward)   Start     Ordered   04/22/18 0300  Troponin I (q 6hr x 3)  Now then every 6 hours,   R     04/21/18 2358   04/21/18 2212  Urinalysis, Routine w reflex microscopic  STAT,   STAT     04/21/18 2211   04/21/18 2034  Urine culture  STAT,   STAT     04/21/18 2037      Vitals/Pain Today's Vitals   04/21/18 2130 04/21/18 2137 04/21/18 2230 04/21/18 2300  BP: (!) 151/86 (!) 151/86 (!) 145/83 (!) 146/88  Pulse: 77 78 71   Resp: 16 18 (!) 22 (!) 22  Temp:      TempSrc:      SpO2: 100% 100% 100% 100%  PainSc:         Isolation Precautions No active isolations  Medications Medications  sodium chloride 0.9 % bolus 500 mL (has no administration in time range)    Mobility Walks with device

## 2018-04-22 NOTE — Progress Notes (Signed)
Outreached [facility] [employer]  [employer] to find rep on call. [facility] will send for pt in the morning with a more definite time. [employer] [facility] phone # is [phone]. [facility] # is [id number].

## 2018-04-22 NOTE — Progress Notes (Signed)
Carotid artery duplex has been completed. 1-39% ICA stenosis bilaterally.  04/22/18 9:41 AM Aaron Nelson RVT

## 2018-04-22 NOTE — Evaluation (Signed)
Physical Therapy Evaluation Patient Details Name: Aaron Nelson MRN: [id number] DOB: 1930/02/11 Today's Date: 04/22/2018   History of Present Illness  84 yo male admitted with syncope, fall at home. Pt sustained a possible nondisplaced C1 fx (in C collar), mildly displaced nasal bone fx, scalp/nasal hematoma, L shoulder pain.  Hx of MDS, pacemaker, DM, syncope    Clinical Impression  On eval, pt required Min assist for mobility. He walked ~75 feet with a RW. Pt denied neck pain but he did c/o L shoulder pain since fall at home. Pt presents with general weakness, decreased activity tolerance, and impaired gait and balance. He is unsteady with mobility and remains at risk for falls. See vitals section for orthostatic BP readings during session. He c/o moderate dizziness during session but he was able to participate. At end of session, BP was 126/71. Discussed d/c plan-pt stated he plans to return home. He feels he will be able to manage at home. He stated he lives with his son who is home most of the time. He is agreeable to HHPT f/u but not agreeable to placement. Will continue to follow and progress activity as tolerated.     Follow Up Recommendations Home health PT;Supervision/Assistance - 24 hour    Equipment Recommendations  None recommended by PT    Recommendations for Other Services       Precautions / Restrictions Precautions Precautions: Fall Required Braces or Orthoses: Cervical Brace Cervical Brace: Hard collar Restrictions Weight Bearing Restrictions: No      Mobility  Bed Mobility Overal bed mobility: Needs Assistance Bed Mobility: Supine to Sit;Sit to Supine     Supine to sit: Supervision Sit to supine: Supervision   General bed mobility comments: for safety. Increased time.   Transfers Overall transfer level: Needs assistance Equipment used: Rolling walker (2 wheeled) Transfers: Sit to/from Stand Sit to Stand: Min assist         General transfer comment:  x3. Increased time. VCs safety, hand placement. Assist to rise and stabilize. Mild posterior bias  Ambulation/Gait Ambulation/Gait assistance: Min assist Ambulation Distance (Feet): 75 Feet Assistive device: Rolling walker (2 wheeled) Gait Pattern/deviations: Step-through pattern;Decreased stride length     General Gait Details: Unsteady. Assist to stabilize throughout distance. Pt c/o some dizziness but he was able to ambulate.   Stairs            Wheelchair Mobility    Modified Rankin (Stroke Patients Only)       Balance Overall balance assessment: Needs assistance;History of Falls         Standing balance support: Bilateral upper extremity supported Standing balance-Leahy Scale: Poor                               Pertinent Vitals/Pain Pain Assessment: Faces Faces Pain Scale: Hurts little more Pain Location: L shoulder Pain Descriptors / Indicators: Sore;Discomfort;Aching Pain Intervention(s): Monitored during session;Repositioned    Home Living Family/patient expects to be discharged to:: Private residence Living Arrangements: Children Available Help at Discharge: Family Type of Home: House Home Access: Stairs to enter   Technical brewer of Steps: 2 Home Layout: One level Home Equipment: Environmental consultant - 2 wheels;Cane - single point;Wheelchair - manual      Prior Function Level of Independence: Independent with assistive device(s)         Comments: ambulatory with a cane     Hand Dominance        Extremity/Trunk  Assessment   Upper Extremity Assessment Upper Extremity Assessment: Generalized weakness    Lower Extremity Assessment Lower Extremity Assessment: Generalized weakness    Cervical / Trunk Assessment Cervical / Trunk Assessment: Kyphotic  Communication   Communication: No difficulties  Cognition Arousal/Alertness: Awake/alert Behavior During Therapy: WFL for tasks assessed/performed                                    General Comments: mostly WFL. Some mild word finding and memory issues      General Comments      Exercises     Assessment/Plan    PT Assessment Patient needs continued PT services  PT Problem List Decreased strength;Decreased balance;Decreased mobility;Decreased activity tolerance;Decreased knowledge of use of DME       PT Treatment Interventions DME instruction;Gait training;Functional mobility training;Therapeutic activities;Balance training;Therapeutic exercise;Stair training;Patient/family education    PT Goals (Current goals can be found in the Care Plan section)  Acute Rehab PT Goals Patient Stated Goal: home today PT Goal Formulation: With patient Time For Goal Achievement: 05/06/18 Potential to Achieve Goals: Good    Frequency Min 3X/week   Barriers to discharge        Co-evaluation               AM-PAC PT "6 Clicks" Daily Activity  Outcome Measure Difficulty turning over in bed (including adjusting bedclothes, sheets and blankets)?: A Little Difficulty moving from lying on back to sitting on the side of the bed? : A Little Difficulty sitting down on and standing up from a chair with arms (e.g., wheelchair, bedside commode, etc,.)?: Unable Help needed moving to and from a bed to chair (including a wheelchair)?: A Little Help needed walking in hospital room?: A Little Help needed climbing 3-5 steps with a railing? : A Little 6 Click Score: 16    End of Session Equipment Utilized During Treatment: Gait belt;Cervical collar Activity Tolerance: Patient tolerated treatment well Patient left: in bed;with call bell/phone within reach;with bed alarm set   PT Visit Diagnosis: Difficulty in walking, not elsewhere classified (R26.2);Unsteadiness on feet (R26.81);Other abnormalities of gait and mobility (R26.89);Muscle weakness (generalized) (M62.81)    Time: 0109-3235 PT Time Calculation (min) (ACUTE ONLY): 35 min   Charges:   PT  Evaluation $PT Eval Moderate Complexity: 1 Mod PT Treatments $Gait Training: 8-22 mins   PT G Codes:          Weston Anna, MPT Pager: [phone]

## 2018-04-22 NOTE — Progress Notes (Signed)
PROGRESS NOTE    Aaron Nelson  CNO:[id number] DOB: Mar 29, 1930 DOA: 04/21/2018 PCP: Charlott Rakes, MD Outpatient Specialists:  Brief Narrative:  84 year old male with history of myelofibrosis with KAK2 mutation, HTN, DM, BPH, sinus arrest with pacemaker placement was brought to the ED via EMA after fall in home. Patient has a history of dizziness when sitting and standing up for last several months, takes his time to get up due to dizziness. He has been hospitalized for syncopal episodes in the past. Fall was witness by son who said patient did not slip or lose balance, but fell flat on face with significant bleeding from nose.  Patient complaines of chronic shortness of breath on exertion and chronic difficultly with urination for last several months which has been worked up by PCP. Denied Chest Pain. CXR showed no active cardiopulmonary disease. CT head, maxillofacial, cervical spine showed linear lucency seen in posterior arch of C1 which is concerning for nondisplaced fracture, although congenital nonunion cannot be excluded. Mild chronic ischemic white matter disease. No acute intracranial abnormality seen. Mildly displaced nasal bone fracture is noted with overlying frontal scalp and nasal hematoma. Severe multilevel degenerative disc disease is noted in the cervical spine. X-ray left wrist physeal scar of distal radius, less likely nondisplaced fracture. Recommend correlation with point tenderness. Severe radiocarpal and lunocapitate osteoarthrosis. Patient was admitted on working diagnosis of syncope with fall.   Assessment & Plan:   Syncope  History of orthostatic dizziness as well as multiple medications that could cause syncope, 500 mls bolus given in ED. History of previous admissions for syncopal episodes. Orthostatic vitals taken this morning showing significant decreases in BP lying to sitting, minimal BP change sitting to standing. History of sinus node dysfunction, but pacemaker  placed.  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 Virgel Manifold - continue home ruxolitinib  Diabetes Mellitus - 04/08/18 11.4 - A1C 11.4, Glucose 177 - continue lantus and SSI   DVT prophylaxis: SCD's Code Status: Full Family Communication: None at bedside Disposition Plan: Home when clinically stable   Consultants:   None  Procedures:   Echocardiogram  Carotid Doppler  Antimicrobials:   None   Subjective: Patient reports no shortness of breath or dizziness today, but has not been up from bed since moved to inpatient floor. Denies chest pain. Notes intermittent left shoulder and left wrist pain from fall.   Objective: Vitals:   04/22/18 0141 04/22/18 0142 04/22/18 0151 04/22/18 0516  BP: (!) 194/84   137/78  Pulse: 71 73  70  Resp: 18   14  Temp: 98.6 F (37 C)   98.6 F (37 C)  TempSrc: Oral     SpO2: (!) 51% 99% 100% 100%  Weight:  Height:        Intake/Output Summary (Last 24 hours) at 04/22/2018 1546 Last data filed at 04/22/2018 1300 Gross per 24 hour  Intake 360 ml  Output 995 ml  Net -635 ml   Filed Weights   04/22/18 0138  Weight: 65.7 kg (144 lb 13.5 oz)     Examination:  General exam: lying in bed, wearing c collar, appears calm and comfortable, in no acute distress.  Respiratory system: Clear to auscultation. Respiratory effort normal. Cardiovascular system: S1 & S2 heard, RRR. No JVD, murmurs, rubs, gallops or clicks. No peripheral extremitiy edema. Gastrointestinal system: Abdomen is nondistended, soft and nontender.  Central nervous system: Alert and oriented. No focal neurological deficits. Extremities: Symmetric 5 x 5 power. Tenderness to palpation of left shoulder and left wrist. Skin: No rashes, bruising on bridge of nose Psychiatry: Mood & affect appropriate.    Data Reviewed: I have personally reviewed following labs and imaging studies  CBC: Recent Labs  Lab 04/21/18 2129 04/22/18 0942  WBC 13.6* 11.8*  NEUTROABS 4.4  --   HGB 10.0* 9.4*  HCT 32.1* 30.6*  MCV 77.2* 78.9  PLT 114* 96*   Basic Metabolic Panel: Recent Labs  Lab 04/21/18 2129 04/22/18 0202 04/22/18 0942  NA 136  --  137  K 4.4  --  4.1  CL 106  --  106  CO2 21*  --  22  GLUCOSE 177*  --  158*  BUN 25*  --  20  CREATININE 1.32*  --  1.20  CALCIUM 9.4  --  8.7*  MG  --  1.9  --    GFR: Estimated Creatinine Clearance: 37.7 mL/min (by C-G formula based on SCr of 1.2 mg/dL). Liver Function Tests: Recent Labs  Lab 04/21/18 2129  AST 45*  ALT 23  ALKPHOS 121  BILITOT 1.3*  PROT 8.5*  ALBUMIN 4.0   No results for input(s): LIPASE, AMYLASE in the last 168 hours. No results for input(s): AMMONIA in the last 168 hours. Coagulation Profile: No results for input(s): INR, PROTIME in the last 168 hours. Cardiac Enzymes: Recent Labs  Lab 04/21/18 2129 04/22/18 0432 04/22/18 0942  TROPONINI <0.03 <0.03 <0.03   BNP (last 3 results) No results for input(s): PROBNP in the last 8760 hours. HbA1C: No results for input(s): HGBA1C in the last 72 hours. CBG: Recent Labs  Lab 04/22/18 0224 04/22/18 0756 04/22/18 1152  GLUCAP 86 76 154*    Lipid Profile: No results for input(s): CHOL, HDL, LDLCALC, TRIG, CHOLHDL, LDLDIRECT in the last 72 hours. Thyroid Function Tests: No results for input(s): TSH, T4TOTAL, FREET4, T3FREE, THYROIDAB in the last 72 hours. Anemia Panel: No results for input(s): VITAMINB12, FOLATE, FERRITIN, TIBC, IRON, RETICCTPCT in the last 72 hours. Urine analysis:    Component Value Date/Time   COLORURINE YELLOW 04/21/2018 2128   APPEARANCEUR CLEAR 04/21/2018 2128   APPEARANCEUR Clear 06/11/2017 1232   LABSPEC 1.011 04/21/2018 2128   PHURINE 5.0 04/21/2018 2128   GLUCOSEU >=500 (A) 04/21/2018 2128   HGBUR SMALL (A) 04/21/2018 2128   BILIRUBINUR NEGATIVE 04/21/2018 2128   BILIRUBINUR Negative 04/08/2018 0933   BILIRUBINUR Negative 06/11/2017 1232   KETONESUR NEGATIVE 04/21/2018 2128   PROTEINUR NEGATIVE 04/21/2018 2128   UROBILINOGEN 1.0 04/08/2018 0933   NITRITE NEGATIVE 04/21/2018 2128   LEUKOCYTESUR NEGATIVE 04/21/2018 2128   LEUKOCYTESUR Negative 06/11/2017 1232   Sepsis Labs: @LABRCNTIP (procalcitonin:4,lacticidven:4)  )No results found for this or any previous visit (from the past 240 hour(s)).  Radiology Studies: Dg Chest 2 View  Result Date: 04/21/2018 CLINICAL DATA:  Syncopal episode with fall today. Diabetes and hypertension. Nonsmoker. EXAM: CHEST - 2 VIEW COMPARISON:  12/05/2017 FINDINGS: Cardiac pacemaker. Normal heart size and pulmonary vascularity. No focal airspace disease or consolidation in the lungs. No blunting of costophrenic angles. No pneumothorax. Mediastinal contours appear intact. Degenerative changes in the spine and shoulders. IMPRESSION: No active cardiopulmonary disease. Electronically Signed   By: Lucienne Capers M.D.   On: 04/21/2018 21:13   Dg Wrist Complete Left  Result Date: 04/21/2018 CLINICAL DATA:  [place] today landing on wrist. EXAM: LEFT WRIST - COMPLETE 3+ VIEW COMPARISON:  None. FINDINGS: Linear sclerosis distal radius without fracture deformity  or dislocation. Severe radiocarpal joint space narrowing with periarticular sclerosis and chronic fragmentation consistent with osteoarthrosis. Severe lunocapitate osteoarthrosis. Osteopenia without destructive bony lesions. Soft tissue planes are nonsuspicious. IMPRESSION: Physeal scar distal radius, less likely nondisplaced fracture. Recommend correlation with point tenderness. Severe radiocarpal and lunocapitate osteoarthrosis. Electronically Signed   By: Elon Alas M.D.   On: 04/21/2018 16:29   Ct Head Wo Contrast  Result Date: 04/21/2018 CLINICAL DATA:  Neck and facial pain after witnessed fall. Dizziness. EXAM: CT HEAD WITHOUT CONTRAST CT MAXILLOFACIAL WITHOUT CONTRAST CT CERVICAL SPINE WITHOUT CONTRAST TECHNIQUE: Multidetector CT imaging of the head, cervical spine, and maxillofacial structures were performed using the standard protocol without intravenous contrast. Multiplanar CT image reconstructions of the cervical spine and maxillofacial structures were also generated. COMPARISON:  CT scan of December 06, 2017. FINDINGS: CT HEAD FINDINGS Brain: Mild chronic ischemic white matter disease is noted. Old right frontal lobe infarction is noted. No mass effect or midline shift is noted. Ventricular size is within normal limits. There is no evidence of mass lesion, hemorrhage or acute infarction. Vascular: No hyperdense vessel or unexpected calcification. Skull: Normal. Negative for fracture or focal lesion. Other: None. CT MAXILLOFACIAL FINDINGS Osseous: Mildly displaced nasal bone fracture is noted. Orbits: Negative. No traumatic or inflammatory finding. Sinuses: Clear. Soft tissues: Moderate frontal scalp hematoma is seen extending over superior portion of nose. CT CERVICAL SPINE FINDINGS Alignment: Reversal of normal lordosis is noted due to degenerative change. Grade 1 anterolisthesis of C3-4 is noted secondary to posterior facet joint hypertrophy. Skull base and vertebrae: Linear lucency seen in  posterior arch of C1 which may represent nondisplaced fracture or possibly nonunion. No other fracture is noted. Soft tissues and spinal canal: No prevertebral fluid or swelling. No visible canal hematoma. Disc levels: Severe degenerative disc disease is noted at C4-5, C5-6, C6-7 and C7-T1 with anterior osteophyte formation. Upper chest: Negative. Other: Degenerative changes are seen involving posterior facet joints bilaterally. IMPRESSION: Linear lucency seen in posterior arch of C1 which is concerning for nondisplaced fracture, although congenital nonunion cannot be excluded. Mild chronic ischemic white matter disease. No acute intracranial abnormality seen. Mildly displaced nasal bone fracture is noted with overlying frontal scalp and nasal hematoma. Severe multilevel degenerative disc disease is noted in the cervical spine. Electronically Signed   By: Marijo Conception, M.D.   On: 04/21/2018 20:04   Ct Cervical Spine Wo Contrast  Result Date: 04/21/2018 CLINICAL DATA:  Neck and facial pain after witnessed fall. Dizziness. EXAM: CT HEAD WITHOUT CONTRAST CT MAXILLOFACIAL WITHOUT CONTRAST CT CERVICAL SPINE WITHOUT CONTRAST TECHNIQUE: Multidetector CT imaging of the head, cervical spine, and maxillofacial structures were performed using the standard protocol without intravenous contrast. Multiplanar CT image reconstructions of the cervical spine and maxillofacial structures were also generated.  COMPARISON:  CT scan of December 06, 2017. FINDINGS: CT HEAD FINDINGS Brain: Mild chronic ischemic white matter disease is noted. Old right frontal lobe infarction is noted. No mass effect or midline shift is noted. Ventricular size is within normal limits. There is no evidence of mass lesion, hemorrhage or acute infarction. Vascular: No hyperdense vessel or unexpected calcification. Skull: Normal. Negative for fracture or focal lesion. Other: None. CT MAXILLOFACIAL FINDINGS Osseous: Mildly displaced nasal bone fracture is  noted. Orbits: Negative. No traumatic or inflammatory finding. Sinuses: Clear. Soft tissues: Moderate frontal scalp hematoma is seen extending over superior portion of nose. CT CERVICAL SPINE FINDINGS Alignment: Reversal of normal lordosis is noted due to degenerative change. Grade 1 anterolisthesis of C3-4 is noted secondary to posterior facet joint hypertrophy. Skull base and vertebrae: Linear lucency seen in posterior arch of C1 which may represent nondisplaced fracture or possibly nonunion. No other fracture is noted. Soft tissues and spinal canal: No prevertebral fluid or swelling. No visible canal hematoma. Disc levels: Severe degenerative disc disease is noted at C4-5, C5-6, C6-7 and C7-T1 with anterior osteophyte formation. Upper chest: Negative. Other: Degenerative changes are seen involving posterior facet joints bilaterally. IMPRESSION: Linear lucency seen in posterior arch of C1 which is concerning for nondisplaced fracture, although congenital nonunion cannot be excluded. Mild chronic ischemic white matter disease. No acute intracranial abnormality seen. Mildly displaced nasal bone fracture is noted with overlying frontal scalp and nasal hematoma. Severe multilevel degenerative disc disease is noted in the cervical spine. Electronically Signed   By: Marijo Conception, M.D.   On: 04/21/2018 20:04   Ct Maxillofacial Wo Contrast  Result Date: 04/21/2018 CLINICAL DATA:  Neck and facial pain after witnessed fall. Dizziness. EXAM: CT HEAD WITHOUT CONTRAST CT MAXILLOFACIAL WITHOUT CONTRAST CT CERVICAL SPINE WITHOUT CONTRAST TECHNIQUE: Multidetector CT imaging of the head, cervical spine, and maxillofacial structures were performed using the standard protocol without intravenous contrast. Multiplanar CT image reconstructions of the cervical spine and maxillofacial structures were also generated. COMPARISON:  CT scan of December 06, 2017. FINDINGS: CT HEAD FINDINGS Brain: Mild chronic ischemic white matter  disease is noted. Old right frontal lobe infarction is noted. No mass effect or midline shift is noted. Ventricular size is within normal limits. There is no evidence of mass lesion, hemorrhage or acute infarction. Vascular: No hyperdense vessel or unexpected calcification. Skull: Normal. Negative for fracture or focal lesion. Other: None. CT MAXILLOFACIAL FINDINGS Osseous: Mildly displaced nasal bone fracture is noted. Orbits: Negative. No traumatic or inflammatory finding. Sinuses: Clear. Soft tissues: Moderate frontal scalp hematoma is seen extending over superior portion of nose. CT CERVICAL SPINE FINDINGS Alignment: Reversal of normal lordosis is noted due to degenerative change. Grade 1 anterolisthesis of C3-4 is noted secondary to posterior facet joint hypertrophy. Skull base and vertebrae: Linear lucency seen in posterior arch of C1 which may represent nondisplaced fracture or possibly nonunion. No other fracture is noted. Soft tissues and spinal canal: No prevertebral fluid or swelling. No visible canal hematoma. Disc levels: Severe degenerative disc disease is noted at C4-5, C5-6, C6-7 and C7-T1 with anterior osteophyte formation. Upper chest: Negative. Other: Degenerative changes are seen involving posterior facet joints bilaterally. IMPRESSION: Linear lucency seen in posterior arch of C1 which is concerning for nondisplaced fracture, although congenital nonunion cannot be excluded. Mild chronic ischemic white matter disease. No acute intracranial abnormality seen. Mildly displaced nasal bone fracture is noted with overlying frontal scalp and nasal hematoma. Severe multilevel degenerative disc disease is  noted in the cervical spine. Electronically Signed   By: Marijo Conception, M.D.   On: 04/21/2018 20:04        Scheduled Meds: . aspirin EC  81 mg Oral Daily  . insulin aspart  0-9 Units Subcutaneous TID WC  . insulin glargine  20 Units Subcutaneous Daily  . ruxolitinib phosphate  10 mg Oral BID   . tamsulosin  0.4 mg Oral Daily   Continuous Infusions:   LOS: 0 days    Time spent: 25 min    Merlene Pulling, PA-S [facility] If 7PM-7AM, please contact night-coverage [email or web address] Password [facility] 04/22/2018, 3:46 PM

## 2018-04-22 NOTE — Plan of Care (Signed)
  Problem: Education: Goal: Knowledge of General Education information will improve Outcome: Progressing   Problem: Health Behavior/Discharge Planning: Goal: Ability to manage health-related needs will improve Outcome: Progressing   Problem: Clinical Measurements: Goal: Will remain free from infection Outcome: Progressing Goal: Respiratory complications will improve Outcome: Progressing   Problem: Activity: Goal: Risk for activity intolerance will decrease Outcome: Progressing   Problem: Nutrition: Goal: Adequate nutrition will be maintained Outcome: Adequate for Discharge   Problem: Coping: Goal: Level of anxiety will decrease Outcome: Adequate for Discharge   Problem: Elimination: Goal: Will not experience complications related to bowel motility Outcome: Adequate for Discharge   Problem: Skin Integrity: Goal: Risk for impaired skin integrity will decrease Outcome: Adequate for Discharge

## 2018-04-22 NOTE — Care Management Obs Status (Signed)
[place] NOTIFICATION   Patient Details  Name: Aaron Nelson MRN: [id number] Date of Birth: 06-12-1930   Medicare Observation Status Notification Given:  Yes    Lynnell Catalan, RN 04/22/2018, 4:10 PM

## 2018-04-22 NOTE — Progress Notes (Signed)
Order entered to have pacemaker interegated. Called [employer] to have tech come out to interegate the pacemaker. RN spoke with rep and they informed the RN 3 times that is was a loop recorder. MRI was ordered by physician, MRI tech informed RN that the pt does have a pacemaker. MRI looking to see if pacemaker is MRI compatable. RN will follow.

## 2018-04-23 ENCOUNTER — Observation Stay (HOSPITAL_BASED_OUTPATIENT_CLINIC_OR_DEPARTMENT_OTHER): Payer: Medicare Other

## 2018-04-23 ENCOUNTER — Ambulatory Visit (HOSPITAL_COMMUNITY): Admit: 2018-04-23 | Payer: Medicare Other

## 2018-04-23 DIAGNOSIS — R55 Syncope and collapse: Secondary | ICD-10-CM

## 2018-04-23 DIAGNOSIS — D7581 Myelofibrosis: Secondary | ICD-10-CM | POA: Diagnosis not present

## 2018-04-23 DIAGNOSIS — I10 Essential (primary) hypertension: Secondary | ICD-10-CM | POA: Diagnosis not present

## 2018-04-23 DIAGNOSIS — R42 Dizziness and giddiness: Secondary | ICD-10-CM | POA: Diagnosis not present

## 2018-04-23 DIAGNOSIS — I455 Other specified heart block: Secondary | ICD-10-CM | POA: Diagnosis not present

## 2018-04-23 LAB — CBC WITH DIFFERENTIAL/PLATELET
BASOS PCT: 0 %
BASOS PCT: 0 %
Band Neutrophils: 0 %
Basophils Absolute: 0 10*3/uL (ref 0.0–0.1)
Basophils Absolute: 0 10*3/uL (ref 0.0–0.1)
Blasts: 0 %
EOS ABS: 0.2 10*3/uL (ref 0.0–0.7)
EOS PCT: 1 %
EOS PCT: 2 %
Eosinophils Absolute: 0.1 10*3/uL (ref 0.0–0.7)
HCT: 32.1 % — ABNORMAL LOW (ref 39.0–52.0)
HCT: 27.9 % — ABNORMAL LOW (ref 39.0–52.0)
Hemoglobin: 10 g/dL — ABNORMAL LOW (ref 13.0–17.0)
Hemoglobin: 8.6 g/dL — ABNORMAL LOW (ref 13.0–17.0)
LYMPHS ABS: 4.2 10*3/uL — AB (ref 0.7–4.0)
LYMPHS PCT: 26 %
LYMPHS PCT: 37 %
Lymphs Abs: 3.5 10*3/uL (ref 0.7–4.0)
MCH: 24 pg — AB (ref 26.0–34.0)
MCH: 24.2 pg — ABNORMAL LOW (ref 26.0–34.0)
MCHC: 31.2 g/dL (ref 30.0–36.0)
MCHC: 30.8 g/dL (ref 30.0–36.0)
MCV: 77.2 fL — ABNORMAL LOW (ref 78.0–100.0)
MCV: 78.6 fL (ref 78.0–100.0)
METAMYELOCYTES PCT: 2 %
MONO ABS: 2.6 10*3/uL — AB (ref 0.1–1.0)
MONO ABS: 2.1 10*3/uL — AB (ref 0.1–1.0)
MONOS PCT: 18 %
Metamyelocytes Relative: 6 %
Monocytes Relative: 19 %
Myelocytes: 15 %
Myelocytes: 13 %
NEUTROS PCT: 28 %
NRBC: 0 /100{WBCs}
Neutro Abs: 6.8 10*3/uL (ref 1.7–7.7)
Neutro Abs: 4.4 10*3/uL (ref 1.7–7.7)
Neutrophils Relative %: 22 %
OTHER: 4 %
OTHER: 4 %
PLATELETS: 88 10*3/uL — AB (ref 150–400)
PROMYELOCYTES RELATIVE: 1 %
Platelets: 114 10*3/uL — ABNORMAL LOW (ref 150–400)
Promyelocytes Relative: 2 %
RBC: 4.16 MIL/uL — AB (ref 4.22–5.81)
RBC: 3.55 MIL/uL — ABNORMAL LOW (ref 4.22–5.81)
RDW: 23.8 % — AB (ref 11.5–15.5)
RDW: 23.5 % — AB (ref 11.5–15.5)
WBC: 13.6 10*3/uL — AB (ref 4.0–10.5)
WBC: 11.4 10*3/uL — ABNORMAL HIGH (ref 4.0–10.5)
nRBC: 64 /100 WBC — ABNORMAL HIGH

## 2018-04-23 LAB — BASIC METABOLIC PANEL
ANION GAP: 8 (ref 5–15)
BUN: 22 mg/dL — AB (ref 6–20)
CHLORIDE: 105 mmol/L (ref 101–111)
CO2: 22 mmol/L (ref 22–32)
Calcium: 8.8 mg/dL — ABNORMAL LOW (ref 8.9–10.3)
Creatinine, Ser: 1.31 mg/dL — ABNORMAL HIGH (ref 0.61–1.24)
GFR calc Af Amer: 56 mL/min — ABNORMAL LOW (ref >60)
GFR calc non Af Amer: 48 mL/min — ABNORMAL LOW (ref >60)
GLUCOSE: 198 mg/dL — AB (ref 65–99)
POTASSIUM: 4.5 mmol/L (ref 3.5–5.1)
Sodium: 135 mmol/L (ref 135–145)

## 2018-04-23 LAB — GLUCOSE, CAPILLARY
GLUCOSE-CAPILLARY: 277 mg/dL — AB (ref 65–99)
Glucose-Capillary: 152 mg/dL — ABNORMAL HIGH (ref 65–99)

## 2018-04-23 LAB — ECHOCARDIOGRAM COMPLETE
HEIGHTINCHES: 64 in
Weight: 2317.48 oz

## 2018-04-23 LAB — URINE CULTURE

## 2018-04-23 LAB — PATHOLOGIST SMEAR REVIEW

## 2018-04-23 MED ORDER — FLUCONAZOLE 100 MG PO TABS
200.0000 mg | ORAL_TABLET | Freq: Once | ORAL | Status: AC
  Administered 2018-04-23: 200 mg via ORAL
  Filled 2018-04-23: qty 2

## 2018-04-23 MED ORDER — TRAMADOL HCL 50 MG PO TABS: 50.0000 mg | ORAL_TABLET | Freq: Four times a day (QID) | ORAL | 0 refills | Status: AC | PRN

## 2018-04-23 NOTE — Progress Notes (Signed)
  Echocardiogram 2D Echocardiogram has been performed.  Aaron Nelson 04/23/2018, 9:58 AM

## 2018-04-23 NOTE — Care Management Note (Signed)
Case Management Note  Patient Details  Name: Aaron Nelson MRN: [id number] Date of Birth: [birth year]  Subjective/Objective:      84 yo admitted with syncope.              Action/Plan: From home with children. Choice offered for home health services and [facility] chosen. [facility] rep alerted of referral. Will need HHPT order at DC.  Expected Discharge Date:                  Expected Discharge Plan:  [place]  In-House Referral:     Discharge planning Services  CM Consult  Post Acute Care Choice:    Choice offered to:  Patient  DME Arranged:    DME Agency:     HH Arranged:  PT [place]:  [place]  Status of Service:  In process, will continue to follow  If discussed at Long Length of Stay Meetings, dates discussed:    Additional CommentsLynnell Catalan, RN 04/23/2018, 10:56 AM [phone]

## 2018-04-23 NOTE — Progress Notes (Signed)
Discharge instructions reviewed with patient and family members. Pt was instructed to continue to wear neck brace when discharged home, will follow up with neurology o/p. Home medications were returned to pt. No questions or concerns at this time.

## 2018-04-23 NOTE — Progress Notes (Signed)
Pharmacy - Brief Note  Ruxolitinib (Jakafi) hold criteria  ANC < 0.5  Pltc < 50K  CrCl < 60 mL/min AND Pltc < 100K  CrCl < 15 mL/min AND not on dialysis  Any LFT > ULN AND Pltc < 100K  Active infection  Estimated Creatinine Clearance: 34.5 mL/min (A) (by C-G formula based on SCr of 1.31 mg/dL (H)).   CBC Latest Ref Rng & Units 04/23/2018 04/22/2018 04/21/2018  WBC 4.0 - 10.5 K/uL 11.4(H) 11.8(H) 13.6(H)  Hemoglobin 13.0 - 17.0 g/dL 8.6(L) 9.4(L) 10.0(L)  Hematocrit 39.0 - 52.0 % 27.9(L) 30.6(L) 32.1(L)  Platelets 150 - 400 K/uL 88(L) 96(L) 114(L)     A/P  CrCl = 34.5 and platelets now < 100. Hold Jakafi per above criteria/policy  Doreene Eland, PharmD, BCPS.   04/23/2018 8:45 AM

## 2018-04-23 NOTE — Discharge Summary (Signed)
Physician Discharge Summary  Aaron Nelson  ZDG:[id number]  DOB: 1930/12/02  DOA: 04/21/2018 PCP: Charlott Rakes, MD  Admit date: 04/21/2018 Discharge date: 04/23/2018  Admitted From: Home Disposition: Home  Recommendations for Outpatient Follow-up:  1. Follow up with PCP in 1 week 2. Please obtain BMP/CBC in one week to monitor hemoglobin, renal function and platelets 3. Follow-up with oncology  Home Health: PT/OT, Aide  Discharge Condition: Stable CODE STATUS: Full code Diet recommendation: Regular   Brief/Interim Summary: For full details see H&P/Progress note, but in brief, Aaron Nelson is a 84 year old male with multiple medical problems which include hypertension diabetes mellitus, sinus arrest status post pacemaker placement in 2018.  Patient presented with weakness fall after feeling dizzy when standing up.  Patient has been work-up by cardiologist for orthostatic hypotension.  Patient has been losing strain in the past few wound, also with shortness of breath for several months.  Patient did not lose consciousness after the fall.  Upon ED evaluation patient was found to have mildly elevated WBC, negative chest x-ray BMP unremarkable, cervical CT showing nondisplaced fracture on C1, neurology recommended c-collar, maxillofacial CT reveals well displaced nasal bone fracture, and mild AKI.  Patient was admitted with working diagnosis of presyncope.  Subjective: Patient seen and examined, feeling much better today.  Still slightly dizzy upon standing but resolves quickly.  Denies chest pain, palpitation weakness.  Today complaining of neck/left shoulder pain.  Discharge Diagnoses/Hospital Course:  Presyncope Felt to be related to orthostatic dizziness and dehydration.  History of sinus node dysfunction.  Orthostatic vital signs positive.Carotid Dopplers no significant stenosis.  Echocardiogram LVEF improved from prior study, 65 to 70%. Pacemaker interrogation, did not show any events.  No able to obtain MRI, due to pacemaker, however CT head with no acute changes that can cause syncope. BP medications were held during hospital stay, BP was labile, will resume only Norvasc for now hold Imdur and discuss with cardiology if indicated. Troponins negative. PT evaluation recommending home health PT.   Sinus node dysfunction status post pacemaker EKG shows sinus rhythm  BPH No issues with urination  Continue tamsulosin for now, however consider stopping medication as an outpatient as this can cause orthostatic hypotension as well.   Myelofibrosis On Ruxolitinb -this medication can cause hypotension if after discontinuation, ?  If patient taking every day. Will hold Ruxolitinib for now.  Low platelet count.  Discussed with oncology Dr.Perlov when safe to start alternative therapy.  DM type II uncontrolled with hyper glycemia A1c 11.4 Continue home dose Lantus, I suspect that patient is not compliance with diet.  As patient was in reduced dose Lantus while inpatient and CBGs were acceptable.  Follow-up with PCP.  Chronic shortness of breath CTA 08/2017 shows no PE, aortic aneurysm measuring 3.7 cm Cardiac work-up: 09/2017, only shows grade 1 diastolic dysfunction Multiple work-up has been negative, patient is not hypoxic.  New echocardiogram has no acute changes.  Follow-up as an outpatient.  Displaced C1 and nasal bone fracture secondary to traumatic fall Continue C collar and follow-up with maxillofacial surgeon and neurosurgery as an outpatient Pain medication as needed  Urinary tract GC infection Urine culture showed 30,000 colonies of yeast One-time dose of Diflucan given.  All other chronic medical condition were stable during the hospitalization.  Patient was seen by physical therapy, recommending home health PT On the day of the discharge the patient's vitals were stable, and no other acute medical condition were reported by patient. the patient was felt safe  to be  discharge to home  Discharge Instructions  You were cared for by a hospitalist during your hospital stay. If you have any questions about your discharge medications or the care you received while you were in the hospital after you are discharged, you can call the unit and asked to speak with the hospitalist on call if the hospitalist that took care of you is not available. Once you are discharged, your primary care physician will handle any further medical issues. Please note that NO REFILLS for any discharge medications will be authorized once you are discharged, as it is imperative that you return to your primary care physician (or establish a relationship with a primary care physician if you do not have one) for your aftercare needs so that they can reassess your need for medications and monitor your lab values.  Discharge Instructions    Call MD for:  difficulty breathing, headache or visual disturbances   Complete by:  As directed    Call MD for:  extreme fatigue   Complete by:  As directed    Call MD for:  hives   Complete by:  As directed    Call MD for:  persistant dizziness or light-headedness   Complete by:  As directed    Call MD for:  persistant nausea and vomiting   Complete by:  As directed    Call MD for:  redness, tenderness, or signs of infection (pain, swelling, redness, odor or green/yellow discharge around incision site)   Complete by:  As directed    Call MD for:  severe uncontrolled pain   Complete by:  As directed    Call MD for:  temperature >100.4   Complete by:  As directed    Diet general   Complete by:  As directed    Increase activity slowly   Complete by:  As directed      Allergies as of 04/23/2018   No Known Allergies     Medication List    STOP taking these medications   cephALEXin 500 MG capsule Commonly known as:  KEFLEX   isosorbide mononitrate 30 MG 24 hr tablet Commonly known as:  IMDUR   lidocaine 2 % solution Commonly known as:   XYLOCAINE   ruxolitinib phosphate 10 MG tablet Commonly known as:  JAKAFI     TAKE these medications   ACCU-CHEK AVIVA PLUS w/Device Kit Use as directed 3 times daily. E11.9   ACCU-CHEK FASTCLIX LANCETS Misc Use as directed 3 times daily. E11.9   acetaminophen 325 MG tablet Commonly known as:  TYLENOL Take 2 tablets (650 mg total) by mouth every 4 (four) hours as needed for mild pain.   amLODipine 10 MG tablet Commonly known as:  NORVASC TAKE 1 TABLET BY MOUTH DAILY   aspirin EC 81 MG tablet Take 81 mg by mouth daily.   glucose blood test strip Commonly known as:  ACCU-CHEK AVIVA PLUS Use as directed 3 times daily. E11.9   Insulin Glargine 100 UNIT/ML Solostar Pen Commonly known as:  LANTUS SOLOSTAR Inject 50 Units into the skin every morning.   Insulin Pen Needle 31G X 5 MM Misc Use as directed   insulin regular 100 units/mL injection Commonly known as:  HUMULIN R Inject 0.1 mLs (10 Units total) into the skin 3 (three) times daily before meals. For Blood Sugar greater than 250   nitrofurantoin (macrocrystal-monohydrate) 100 MG capsule Commonly known as:  MACROBID Take 1 capsule (100 mg total) by mouth  2 (two) times daily.   NITROSTAT 0.4 MG SL tablet Generic drug:  nitroGLYCERIN Place 1 tablet (0.4 mg total) under the tongue every 5 (five) minutes as needed for chest pain.   tamsulosin 0.4 MG Caps capsule Commonly known as:  FLOMAX Take 1 capsule (0.4 mg total) by mouth daily.   traMADol 50 MG tablet Commonly known as:  ULTRAM Take 1 tablet (50 mg total) by mouth every 6 (six) hours as needed for up to 5 days for moderate pain.      Follow-up Information    [facility]   Specialty:  [place] Why:  For home health physical therapy Contact information: [place] [address] [phone]        Charlott Rakes, MD. Schedule an appointment as soon as possible for a visit in 1 week(s).    Specialty:  Family Medicine Why:  Hospital follow-up Contact information: [place] [address] [phone]          No Known Allergies  Consultations:  None    Procedures/Studies: Dg Chest 2 View  Result Date: 04/21/2018 CLINICAL DATA:  Syncopal episode with fall today. Diabetes and hypertension. Nonsmoker. EXAM: CHEST - 2 VIEW COMPARISON:  12/05/2017 FINDINGS: Cardiac pacemaker. Normal heart size and pulmonary vascularity. No focal airspace disease or consolidation in the lungs. No blunting of costophrenic angles. No pneumothorax. Mediastinal contours appear intact. Degenerative changes in the spine and shoulders. IMPRESSION: No active cardiopulmonary disease. Electronically Signed   By: Lucienne Capers M.D.   On: 04/21/2018 21:13   Dg Wrist Complete Left  Result Date: 04/21/2018 CLINICAL DATA:  [place] today landing on wrist. EXAM: LEFT WRIST - COMPLETE 3+ VIEW COMPARISON:  None. FINDINGS: Linear sclerosis distal radius without fracture deformity or dislocation. Severe radiocarpal joint space narrowing with periarticular sclerosis and chronic fragmentation consistent with osteoarthrosis. Severe lunocapitate osteoarthrosis. Osteopenia without destructive bony lesions. Soft tissue planes are nonsuspicious. IMPRESSION: Physeal scar distal radius, less likely nondisplaced fracture. Recommend correlation with point tenderness. Severe radiocarpal and lunocapitate osteoarthrosis. Electronically Signed   By: Elon Alas M.D.   On: 04/21/2018 16:29   Ct Head Wo Contrast  Result Date: 04/21/2018 CLINICAL DATA:  Neck and facial pain after witnessed fall. Dizziness. EXAM: CT HEAD WITHOUT CONTRAST CT MAXILLOFACIAL WITHOUT CONTRAST CT CERVICAL SPINE WITHOUT CONTRAST TECHNIQUE: Multidetector CT imaging of the head, cervical spine, and maxillofacial structures were performed using the standard protocol without intravenous contrast. Multiplanar CT image reconstructions of  the cervical spine and maxillofacial structures were also generated. COMPARISON:  CT scan of December 06, 2017. FINDINGS: CT HEAD FINDINGS Brain: Mild chronic ischemic white matter disease is noted. Old right frontal lobe infarction is noted. No mass effect or midline shift is noted. Ventricular size is within normal limits. There is no evidence of mass lesion, hemorrhage or acute infarction. Vascular: No hyperdense vessel or unexpected calcification. Skull: Normal. Negative for fracture or focal lesion. Other: None. CT MAXILLOFACIAL FINDINGS Osseous: Mildly displaced nasal bone fracture is noted. Orbits: Negative. No traumatic or inflammatory finding. Sinuses: Clear. Soft tissues: Moderate frontal scalp hematoma is seen extending over superior portion of nose. CT CERVICAL SPINE FINDINGS Alignment: Reversal of normal lordosis is noted due to degenerative change. Grade 1 anterolisthesis of C3-4 is noted secondary to posterior facet joint hypertrophy. Skull base and vertebrae: Linear lucency seen in posterior arch of C1 which may represent nondisplaced fracture or possibly nonunion. No other fracture is noted.  Soft tissues and spinal canal: No prevertebral fluid or swelling. No visible canal hematoma. Disc levels: Severe degenerative disc disease is noted at C4-5, C5-6, C6-7 and C7-T1 with anterior osteophyte formation. Upper chest: Negative. Other: Degenerative changes are seen involving posterior facet joints bilaterally. IMPRESSION: Linear lucency seen in posterior arch of C1 which is concerning for nondisplaced fracture, although congenital nonunion cannot be excluded. Mild chronic ischemic white matter disease. No acute intracranial abnormality seen. Mildly displaced nasal bone fracture is noted with overlying frontal scalp and nasal hematoma. Severe multilevel degenerative disc disease is noted in the cervical spine. Electronically Signed   By: Marijo Conception, M.D.   On: 04/21/2018 20:04   Ct Cervical Spine  Wo Contrast  Result Date: 04/21/2018 CLINICAL DATA:  Neck and facial pain after witnessed fall. Dizziness. EXAM: CT HEAD WITHOUT CONTRAST CT MAXILLOFACIAL WITHOUT CONTRAST CT CERVICAL SPINE WITHOUT CONTRAST TECHNIQUE: Multidetector CT imaging of the head, cervical spine, and maxillofacial structures were performed using the standard protocol without intravenous contrast. Multiplanar CT image reconstructions of the cervical spine and maxillofacial structures were also generated. COMPARISON:  CT scan of December 06, 2017. FINDINGS: CT HEAD FINDINGS Brain: Mild chronic ischemic white matter disease is noted. Old right frontal lobe infarction is noted. No mass effect or midline shift is noted. Ventricular size is within normal limits. There is no evidence of mass lesion, hemorrhage or acute infarction. Vascular: No hyperdense vessel or unexpected calcification. Skull: Normal. Negative for fracture or focal lesion. Other: None. CT MAXILLOFACIAL FINDINGS Osseous: Mildly displaced nasal bone fracture is noted. Orbits: Negative. No traumatic or inflammatory finding. Sinuses: Clear. Soft tissues: Moderate frontal scalp hematoma is seen extending over superior portion of nose. CT CERVICAL SPINE FINDINGS Alignment: Reversal of normal lordosis is noted due to degenerative change. Grade 1 anterolisthesis of C3-4 is noted secondary to posterior facet joint hypertrophy. Skull base and vertebrae: Linear lucency seen in posterior arch of C1 which may represent nondisplaced fracture or possibly nonunion. No other fracture is noted. Soft tissues and spinal canal: No prevertebral fluid or swelling. No visible canal hematoma. Disc levels: Severe degenerative disc disease is noted at C4-5, C5-6, C6-7 and C7-T1 with anterior osteophyte formation. Upper chest: Negative. Other: Degenerative changes are seen involving posterior facet joints bilaterally. IMPRESSION: Linear lucency seen in posterior arch of C1 which is concerning for  nondisplaced fracture, although congenital nonunion cannot be excluded. Mild chronic ischemic white matter disease. No acute intracranial abnormality seen. Mildly displaced nasal bone fracture is noted with overlying frontal scalp and nasal hematoma. Severe multilevel degenerative disc disease is noted in the cervical spine. Electronically Signed   By: Marijo Conception, M.D.   On: 04/21/2018 20:04   Ct Maxillofacial Wo Contrast  Result Date: 04/21/2018 CLINICAL DATA:  Neck and facial pain after witnessed fall. Dizziness. EXAM: CT HEAD WITHOUT CONTRAST CT MAXILLOFACIAL WITHOUT CONTRAST CT CERVICAL SPINE WITHOUT CONTRAST TECHNIQUE: Multidetector CT imaging of the head, cervical spine, and maxillofacial structures were performed using the standard protocol without intravenous contrast. Multiplanar CT image reconstructions of the cervical spine and maxillofacial structures were also generated. COMPARISON:  CT scan of December 06, 2017. FINDINGS: CT HEAD FINDINGS Brain: Mild chronic ischemic white matter disease is noted. Old right frontal lobe infarction is noted. No mass effect or midline shift is noted. Ventricular size is within normal limits. There is no evidence of mass lesion, hemorrhage or acute infarction. Vascular: No hyperdense vessel or unexpected calcification. Skull: Normal. Negative for fracture or  focal lesion. Other: None. CT MAXILLOFACIAL FINDINGS Osseous: Mildly displaced nasal bone fracture is noted. Orbits: Negative. No traumatic or inflammatory finding. Sinuses: Clear. Soft tissues: Moderate frontal scalp hematoma is seen extending over superior portion of nose. CT CERVICAL SPINE FINDINGS Alignment: Reversal of normal lordosis is noted due to degenerative change. Grade 1 anterolisthesis of C3-4 is noted secondary to posterior facet joint hypertrophy. Skull base and vertebrae: Linear lucency seen in posterior arch of C1 which may represent nondisplaced fracture or possibly nonunion. No other  fracture is noted. Soft tissues and spinal canal: No prevertebral fluid or swelling. No visible canal hematoma. Disc levels: Severe degenerative disc disease is noted at C4-5, C5-6, C6-7 and C7-T1 with anterior osteophyte formation. Upper chest: Negative. Other: Degenerative changes are seen involving posterior facet joints bilaterally. IMPRESSION: Linear lucency seen in posterior arch of C1 which is concerning for nondisplaced fracture, although congenital nonunion cannot be excluded. Mild chronic ischemic white matter disease. No acute intracranial abnormality seen. Mildly displaced nasal bone fracture is noted with overlying frontal scalp and nasal hematoma. Severe multilevel degenerative disc disease is noted in the cervical spine. Electronically Signed   By: Marijo Conception, M.D.   On: 04/21/2018 20:04    Discharge Exam: Vitals:   04/22/18 2007 04/23/18 0450  BP: (!) 169/82 100/62  Pulse: 66 72  Resp: 16 14  Temp: (!) 97.5 F (36.4 C) 97.7 F (36.5 C)  SpO2: 100% 100%   Vitals:   04/22/18 1718 04/22/18 1751 04/22/18 2007 04/23/18 0450  BP: (!) 192/76 (!) 174/73 (!) 169/82 100/62  Pulse: 67 67 66 72  Resp: '16  16 14  ' Temp: 97.7 F (36.5 C)  (!) 97.5 F (36.4 C) 97.7 F (36.5 C)  TempSrc: Oral  Oral Oral  SpO2: 100%  100% 100%  Weight:      Height:        General: Pt is alert, awake, not in acute distress,  HEENT: Cervical collar in place, maxillary and nasal hematoma Cardiovascular: RRR, S1/S2  Respiratory: CTA bilaterally Abdominal: Soft, NT, ND Extremities: no edema   The results of significant diagnostics from this hospitalization (including imaging, microbiology, ancillary and laboratory) are listed below for reference.     Microbiology: Recent Results (from the past 240 hour(s))  Urine culture     Status: Abnormal   Collection Time: 04/21/18  9:29 PM  Result Value Ref Range Status   Specimen Description   Final    URINE, RANDOM Performed at [facility] [address], [place], [place] [address]    Special Requests   Final    NONE Performed at [facility], [place] [address], [place], [place] [address]    Culture 30,000 COLONIES/mL YEAST (A)  Final   Report Status 04/23/2018 FINAL  Final     Labs: BNP (last 3 results) Recent Labs    09/16/17 1511  BNP 76.7   Basic Metabolic Panel: Recent Labs  Lab 04/21/18 2129 04/22/18 0202 04/22/18 0942 04/23/18 0445  NA 136  --  137 135  K 4.4  --  4.1 4.5  CL 106  --  106 105  CO2 21*  --  22 22  GLUCOSE 177*  --  158* 198*  BUN 25*  --  20 22*  CREATININE 1.32*  --  1.20 1.31*  CALCIUM 9.4  --  8.7* 8.8*  MG  --  1.9  --   --    Liver Function  Tests: Recent Labs  Lab 04/21/18 2129  AST 45*  ALT 23  ALKPHOS 121  BILITOT 1.3*  PROT 8.5*  ALBUMIN 4.0   No results for input(s): LIPASE, AMYLASE in the last 168 hours. No results for input(s): AMMONIA in the last 168 hours. CBC: Recent Labs  Lab 04/21/18 2129 04/22/18 0942 04/23/18 0445  WBC 13.6* 11.8* 11.4*  NEUTROABS 4.4  --  4.4  HGB 10.0* 9.4* 8.6*  HCT 32.1* 30.6* 27.9*  MCV 77.2* 78.9 78.6  PLT 114* 96* 88*   Cardiac Enzymes: Recent Labs  Lab 04/21/18 2129 04/22/18 0432 04/22/18 0942  TROPONINI <0.03 <0.03 <0.03   BNP: Invalid input(s): POCBNP CBG: Recent Labs  Lab 04/22/18 1152 04/22/18 1633 04/22/18 2003 04/23/18 0732 04/23/18 1217  GLUCAP 154* 159* 156* 152* 277*   D-Dimer No results for input(s): DDIMER in the last 72 hours. Hgb A1c No results for input(s): HGBA1C in the last 72 hours. Lipid Profile No results for input(s): CHOL, HDL, LDLCALC, TRIG, CHOLHDL, LDLDIRECT in the last 72 hours. Thyroid function studies No results for input(s): TSH, T4TOTAL, T3FREE, THYROIDAB in the last 72 hours.  Invalid input(s): FREET3 Anemia work up No results for input(s): VITAMINB12, FOLATE, FERRITIN, TIBC, IRON, RETICCTPCT in the last 72 hours. Urinalysis     Component Value Date/Time   COLORURINE YELLOW 04/21/2018 2128   APPEARANCEUR CLEAR 04/21/2018 2128   APPEARANCEUR Clear 06/11/2017 1232   LABSPEC 1.011 04/21/2018 2128   PHURINE 5.0 04/21/2018 2128   GLUCOSEU >=500 (A) 04/21/2018 2128   HGBUR SMALL (A) 04/21/2018 2128   BILIRUBINUR NEGATIVE 04/21/2018 2128   BILIRUBINUR Negative 04/08/2018 0933   BILIRUBINUR Negative 06/11/2017 1232   KETONESUR NEGATIVE 04/21/2018 2128   PROTEINUR NEGATIVE 04/21/2018 2128   UROBILINOGEN 1.0 04/08/2018 0933   NITRITE NEGATIVE 04/21/2018 2128   LEUKOCYTESUR NEGATIVE 04/21/2018 2128   LEUKOCYTESUR Negative 06/11/2017 1232   Sepsis Labs Invalid input(s): PROCALCITONIN,  WBC,  LACTICIDVEN Microbiology Recent Results (from the past 240 hour(s))  Urine culture     Status: Abnormal   Collection Time: 04/21/18  9:29 PM  Result Value Ref Range Status   Specimen Description   Final    URINE, RANDOM Performed at [facility], [place] [address] [place], [place] [address]    Special Requests   Final    NONE Performed at [facility], [place] [address] [place], [place] [address]    Culture 30,000 COLONIES/mL YEAST (A)  Final   Report Status 04/23/2018 FINAL  Final    Time coordinating discharge: 35 minutes  SIGNED:  Chipper Oman, MD  [facility] 04/23/2018, 12:42 PM  Pager please text page via  [email or web address]  Note - This record has been created using [employer]. Chart creation errors have been sought, but may not always have been located. Such creation errors do not reflect on the standard of medical care.

## 2018-04-23 NOTE — Progress Notes (Signed)
PROGRESS NOTE    Aaron Nelson  NKN:[id number] DOB: 1930-12-15 DOA: 04/21/2018 PCP: Charlott Rakes, MD  Outpatient Specialists:  Brief Narrative:  84 year old male with history of myelofibrosis with KAK2 mutation, HTN, DM, BPH, sinus arrest with pacemaker placement was brought to the ED via [facility] after fall in home. Patient has a history of dizziness when sitting and standing up for last several months, takes his time to get up due to dizziness. He has been hospitalized for syncopal episodes in the past. Fall was witness by son who said patient did not slip or lose balance, but fell flat on face with significant bleeding from nose.  Patient complains of chronic shortness of breath on exertion and chronic difficultly with urination for last several months which has been worked up by PCP. Denied Chest Pain. CXR showed no active cardiopulmonary disease. CT head, maxillofacial, cervical spine showed linear lucency seen in posterior arch of C1 which is concerning for nondisplaced fracture, although congenital nonunion cannot be excluded. Mild chronic ischemic white matter disease. No acute intracranial abnormality seen. Mildly displaced nasal bone fracture is noted with overlying frontal scalp and nasal hematoma. Severe multilevel degenerative disc disease is noted in the cervical spine. X-ray left wrist physeal scar of distal radius, less likely nondisplaced fracture. Recommend correlation with point tenderness. Severe radiocarpal and lunocapitate osteoarthrosis. Patient was admitted on working diagnosis of syncope with fall.   Assessment & Plan:   Presyncope  Most likely due to orthostatic dizziness. History of orthostatic dizziness as well as multiple medications that could cause syncope, 500 mls bolus given in ED. History of sinus node dysfunction, but pacemaker placed.  - troponins <0.03, electrolytes unremarkable - Echo showed LVEF 65-70%, trivial MR - Carotid doppler showed no significant stenosis -  pacemaker is MRI compatible, but leads are not - no MRI ordered - PT recommended home health PT  Urinary Tract Yeast Infection: - urine cultures showed yeast - one dose diflucan given  Myelofibrosis - followed by Dr. 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 - 09/2017 - stress test low risk  - 09/2017 Echo showed EF 37-90%, Grade 1 diastolic dysfunction,  - Echo today showed LVEF 65-70%, trivial MR - CXR showed no signs of fluid overload  Displaced C1 and Nasal bone fracture secondary to fall - seen on CT, will follow-up with maxillofacial surgery and neurosurgery on discharge - continue cervical collar  Sinus node dysfunction s/p pacemaker placement - EKG showed sinus rhythm - [employer] will interrogate pacemaker  BPH - continue home tamsulosin  Diabetes Mellitus - 04/08/18 11.4 - A1C 11.4, Glucose 177 - continue lantus and SSI  DVT prophylaxis: SCD's Code Status: Full Family Communication: None at bedside Disposition Plan: Home when clinically stable   Consultants:   None  Procedures:   Carotid Doppler  Echocardiogram  Antimicrobials:   None    Subjective: Patient has been up and in the chair yesterday and today. Reported one episode of dizziness after moving to chair, but states that this quickly resolved. Denies left shoulder and wrist pain.   Objective: Vitals:   04/22/18 1718 04/22/18 1751 04/22/18 2007 04/23/18 0450  BP: (!) 192/76 (!) 174/73 (!) 169/82 100/62  Pulse: 67 67 66 72  Resp: 16  16 14   Temp: 97.7 F (36.5 C)  (!) 97.5 F (36.4 C)  97.7 F (36.5 C)  TempSrc: Oral  Oral Oral  SpO2: 100%  100% 100%  Weight:      Height:        Intake/Output Summary (Last 24 hours) at 04/23/2018 1137 Last data filed at 04/23/2018 1050 Gross per 24 hour  Intake 720 ml  Output 1650 ml    Net -930 ml   Filed Weights   04/22/18 0138  Weight: 65.7 kg (144 lb 13.5 oz)    Examination:  General exam: Appears calm and comfortable, in not acute distress or pain.   HEENT: cervical collar, maxillary and nasal hematoma Respiratory system: Clear to auscultation. Respiratory effort normal. Cardiovascular system: S1 & S2 heard, RRR. No murmurs. No peripheral extremity edema. Gastrointestinal system: Abdomen is nondistended, soft and nontender.  Central nervous system: Alert and oriented. No focal neurological deficits. Extremities: Symmetric 5 x 5 power.  Skin: No rashes lesions Psychiatry: Mood & affect appropriate.     Data Reviewed: I have personally reviewed following labs and imaging studies  CBC: Recent Labs  Lab 04/21/18 2129 04/22/18 0942 04/23/18 0445  WBC 13.6* 11.8* 11.4*  NEUTROABS 4.4  --  4.4  HGB 10.0* 9.4* 8.6*  HCT 32.1* 30.6* 27.9*  MCV 77.2* 78.9 78.6  PLT 114* 96* 88*   Basic Metabolic Panel: Recent Labs  Lab 04/21/18 2129 04/22/18 0202 04/22/18 0942 04/23/18 0445  NA 136  --  137 135  K 4.4  --  4.1 4.5  CL 106  --  106 105  CO2 21*  --  22 22  GLUCOSE 177*  --  158* 198*  BUN 25*  --  20 22*  CREATININE 1.32*  --  1.20 1.31*  CALCIUM 9.4  --  8.7* 8.8*  MG  --  1.9  --   --    GFR: Estimated Creatinine Clearance: 34.5 mL/min (A) (by C-G formula based on SCr of 1.31 mg/dL (H)). Liver Function Tests: Recent Labs  Lab 04/21/18 2129  AST 45*  ALT 23  ALKPHOS 121  BILITOT 1.3*  PROT 8.5*  ALBUMIN 4.0   No results for input(s): LIPASE, AMYLASE in the last 168 hours. No results for input(s): AMMONIA in the last 168 hours. Coagulation Profile: No results for input(s): INR, PROTIME in the last 168 hours. Cardiac Enzymes: Recent Labs  Lab 04/21/18 2129 04/22/18 0432 04/22/18 0942  TROPONINI <0.03 <0.03 <0.03   BNP (last 3 results) No results for input(s): PROBNP in the last 8760 hours. HbA1C: No results for input(s):  HGBA1C in the last 72 hours. CBG: Recent Labs  Lab 04/22/18 0756 04/22/18 1152 04/22/18 1633 04/22/18 2003 04/23/18 0732  GLUCAP 76 154* 159* 156* 152*   Lipid Profile: No results for input(s): CHOL, HDL, LDLCALC, TRIG, CHOLHDL, LDLDIRECT in the last 72 hours. Thyroid Function Tests: No results for input(s): TSH, T4TOTAL, FREET4, T3FREE, THYROIDAB in the last 72 hours. Anemia Panel: No results for input(s): VITAMINB12, FOLATE, FERRITIN, TIBC, IRON, RETICCTPCT in the last 72 hours. Urine analysis:    Component Value Date/Time   COLORURINE YELLOW 04/21/2018 2128   APPEARANCEUR CLEAR 04/21/2018 2128   APPEARANCEUR Clear 06/11/2017 1232   LABSPEC 1.011 04/21/2018 2128   PHURINE 5.0 04/21/2018 2128   GLUCOSEU >=500 (A) 04/21/2018 2128   HGBUR SMALL (A) 04/21/2018 2128   BILIRUBINUR NEGATIVE 04/21/2018 2128   BILIRUBINUR Negative 04/08/2018 0933   BILIRUBINUR Negative 06/11/2017 [place] 04/21/2018 2128   PROTEINUR NEGATIVE 04/21/2018 2128   UROBILINOGEN  1.0 04/08/2018 0933   NITRITE NEGATIVE 04/21/2018 2128   LEUKOCYTESUR NEGATIVE 04/21/2018 2128   LEUKOCYTESUR Negative 06/11/2017 1232   Sepsis Labs: @LABRCNTIP (procalcitonin:4,lacticidven:4)  ) Recent Results (from the past 240 hour(s))  Urine culture     Status: Abnormal   Collection Time: 04/21/18  9:29 PM  Result Value Ref Range Status   Specimen Description   Final    URINE, RANDOM Performed at [facility] [address] [place], [place] [address]    Special Requests   Final    NONE Performed at [facility], [place] [address] [place], [place] [address]    Culture 30,000 COLONIES/mL YEAST (A)  Final   Report Status 04/23/2018 FINAL  Final         Radiology Studies: Dg Chest 2 View  Result Date: 04/21/2018 CLINICAL DATA:  Syncopal episode with fall today. Diabetes and hypertension. Nonsmoker. EXAM: CHEST - 2 VIEW COMPARISON:  12/05/2017 FINDINGS:  Cardiac pacemaker. Normal heart size and pulmonary vascularity. No focal airspace disease or consolidation in the lungs. No blunting of costophrenic angles. No pneumothorax. Mediastinal contours appear intact. Degenerative changes in the spine and shoulders. IMPRESSION: No active cardiopulmonary disease. Electronically Signed   By: Lucienne Capers M.D.   On: 04/21/2018 21:13   Dg Wrist Complete Left  Result Date: 04/21/2018 CLINICAL DATA:  [place] today landing on wrist. EXAM: LEFT WRIST - COMPLETE 3+ VIEW COMPARISON:  None. FINDINGS: Linear sclerosis distal radius without fracture deformity or dislocation. Severe radiocarpal joint space narrowing with periarticular sclerosis and chronic fragmentation consistent with osteoarthrosis. Severe lunocapitate osteoarthrosis. Osteopenia without destructive bony lesions. Soft tissue planes are nonsuspicious. IMPRESSION: Physeal scar distal radius, less likely nondisplaced fracture. Recommend correlation with point tenderness. Severe radiocarpal and lunocapitate osteoarthrosis. Electronically Signed   By: Elon Alas M.D.   On: 04/21/2018 16:29   Ct Head Wo Contrast  Result Date: 04/21/2018 CLINICAL DATA:  Neck and facial pain after witnessed fall. Dizziness. EXAM: CT HEAD WITHOUT CONTRAST CT MAXILLOFACIAL WITHOUT CONTRAST CT CERVICAL SPINE WITHOUT CONTRAST TECHNIQUE: Multidetector CT imaging of the head, cervical spine, and maxillofacial structures were performed using the standard protocol without intravenous contrast. Multiplanar CT image reconstructions of the cervical spine and maxillofacial structures were also generated. COMPARISON:  CT scan of December 06, 2017. FINDINGS: CT HEAD FINDINGS Brain: Mild chronic ischemic white matter disease is noted. Old right frontal lobe infarction is noted. No mass effect or midline shift is noted. Ventricular size is within normal limits. There is no evidence of mass lesion, hemorrhage or acute infarction. Vascular: No  hyperdense vessel or unexpected calcification. Skull: Normal. Negative for fracture or focal lesion. Other: None. CT MAXILLOFACIAL FINDINGS Osseous: Mildly displaced nasal bone fracture is noted. Orbits: Negative. No traumatic or inflammatory finding. Sinuses: Clear. Soft tissues: Moderate frontal scalp hematoma is seen extending over superior portion of nose. CT CERVICAL SPINE FINDINGS Alignment: Reversal of normal lordosis is noted due to degenerative change. Grade 1 anterolisthesis of C3-4 is noted secondary to posterior facet joint hypertrophy. Skull base and vertebrae: Linear lucency seen in posterior arch of C1 which may represent nondisplaced fracture or possibly nonunion. No other fracture is noted. Soft tissues and spinal canal: No prevertebral fluid or swelling. No visible canal hematoma. Disc levels: Severe degenerative disc disease is noted at C4-5, C5-6, C6-7 and C7-T1 with anterior osteophyte formation. Upper chest: Negative. Other: Degenerative changes are seen involving posterior facet joints bilaterally. IMPRESSION: Linear lucency seen in posterior arch of C1  which is concerning for nondisplaced fracture, although congenital nonunion cannot be excluded. Mild chronic ischemic white matter disease. No acute intracranial abnormality seen. Mildly displaced nasal bone fracture is noted with overlying frontal scalp and nasal hematoma. Severe multilevel degenerative disc disease is noted in the cervical spine. Electronically Signed   By: Marijo Conception, M.D.   On: 04/21/2018 20:04   Ct Cervical Spine Wo Contrast  Result Date: 04/21/2018 CLINICAL DATA:  Neck and facial pain after witnessed fall. Dizziness. EXAM: CT HEAD WITHOUT CONTRAST CT MAXILLOFACIAL WITHOUT CONTRAST CT CERVICAL SPINE WITHOUT CONTRAST TECHNIQUE: Multidetector CT imaging of the head, cervical spine, and maxillofacial structures were performed using the standard protocol without intravenous contrast. Multiplanar CT image  reconstructions of the cervical spine and maxillofacial structures were also generated. COMPARISON:  CT scan of December 06, 2017. FINDINGS: CT HEAD FINDINGS Brain: Mild chronic ischemic white matter disease is noted. Old right frontal lobe infarction is noted. No mass effect or midline shift is noted. Ventricular size is within normal limits. There is no evidence of mass lesion, hemorrhage or acute infarction. Vascular: No hyperdense vessel or unexpected calcification. Skull: Normal. Negative for fracture or focal lesion. Other: None. CT MAXILLOFACIAL FINDINGS Osseous: Mildly displaced nasal bone fracture is noted. Orbits: Negative. No traumatic or inflammatory finding. Sinuses: Clear. Soft tissues: Moderate frontal scalp hematoma is seen extending over superior portion of nose. CT CERVICAL SPINE FINDINGS Alignment: Reversal of normal lordosis is noted due to degenerative change. Grade 1 anterolisthesis of C3-4 is noted secondary to posterior facet joint hypertrophy. Skull base and vertebrae: Linear lucency seen in posterior arch of C1 which may represent nondisplaced fracture or possibly nonunion. No other fracture is noted. Soft tissues and spinal canal: No prevertebral fluid or swelling. No visible canal hematoma. Disc levels: Severe degenerative disc disease is noted at C4-5, C5-6, C6-7 and C7-T1 with anterior osteophyte formation. Upper chest: Negative. Other: Degenerative changes are seen involving posterior facet joints bilaterally. IMPRESSION: Linear lucency seen in posterior arch of C1 which is concerning for nondisplaced fracture, although congenital nonunion cannot be excluded. Mild chronic ischemic white matter disease. No acute intracranial abnormality seen. Mildly displaced nasal bone fracture is noted with overlying frontal scalp and nasal hematoma. Severe multilevel degenerative disc disease is noted in the cervical spine. Electronically Signed   By: Marijo Conception, M.D.   On: 04/21/2018 20:04    Ct Maxillofacial Wo Contrast  Result Date: 04/21/2018 CLINICAL DATA:  Neck and facial pain after witnessed fall. Dizziness. EXAM: CT HEAD WITHOUT CONTRAST CT MAXILLOFACIAL WITHOUT CONTRAST CT CERVICAL SPINE WITHOUT CONTRAST TECHNIQUE: Multidetector CT imaging of the head, cervical spine, and maxillofacial structures were performed using the standard protocol without intravenous contrast. Multiplanar CT image reconstructions of the cervical spine and maxillofacial structures were also generated. COMPARISON:  CT scan of December 06, 2017. FINDINGS: CT HEAD FINDINGS Brain: Mild chronic ischemic white matter disease is noted. Old right frontal lobe infarction is noted. No mass effect or midline shift is noted. Ventricular size is within normal limits. There is no evidence of mass lesion, hemorrhage or acute infarction. Vascular: No hyperdense vessel or unexpected calcification. Skull: Normal. Negative for fracture or focal lesion. Other: None. CT MAXILLOFACIAL FINDINGS Osseous: Mildly displaced nasal bone fracture is noted. Orbits: Negative. No traumatic or inflammatory finding. Sinuses: Clear. Soft tissues: Moderate frontal scalp hematoma is seen extending over superior portion of nose. CT CERVICAL SPINE FINDINGS Alignment: Reversal of normal lordosis is noted due to degenerative change. Grade  1 anterolisthesis of C3-4 is noted secondary to posterior facet joint hypertrophy. Skull base and vertebrae: Linear lucency seen in posterior arch of C1 which may represent nondisplaced fracture or possibly nonunion. No other fracture is noted. Soft tissues and spinal canal: No prevertebral fluid or swelling. No visible canal hematoma. Disc levels: Severe degenerative disc disease is noted at C4-5, C5-6, C6-7 and C7-T1 with anterior osteophyte formation. Upper chest: Negative. Other: Degenerative changes are seen involving posterior facet joints bilaterally. IMPRESSION: Linear lucency seen in posterior arch of C1 which is  concerning for nondisplaced fracture, although congenital nonunion cannot be excluded. Mild chronic ischemic white matter disease. No acute intracranial abnormality seen. Mildly displaced nasal bone fracture is noted with overlying frontal scalp and nasal hematoma. Severe multilevel degenerative disc disease is noted in the cervical spine. Electronically Signed   By: Marijo Conception, M.D.   On: 04/21/2018 20:04        Scheduled Meds: . aspirin EC  81 mg Oral Daily  . insulin aspart  0-9 Units Subcutaneous TID WC  . insulin glargine  20 Units Subcutaneous Daily  . tamsulosin  0.4 mg Oral Daily   Continuous Infusions:   LOS: 0 days    Time spent: 25 min  Merlene Pulling, PA-S [facility] If 7PM-7AM, please contact night-coverage [email or web address] Password [facility] 04/23/2018, 11:37 AM

## 2018-04-25 ENCOUNTER — Other Ambulatory Visit: Payer: Self-pay | Admitting: Hematology and Oncology

## 2018-04-25 DIAGNOSIS — I10 Essential (primary) hypertension: Secondary | ICD-10-CM | POA: Diagnosis not present

## 2018-04-25 DIAGNOSIS — E1165 Type 2 diabetes mellitus with hyperglycemia: Secondary | ICD-10-CM | POA: Diagnosis not present

## 2018-04-25 DIAGNOSIS — N400 Enlarged prostate without lower urinary tract symptoms: Secondary | ICD-10-CM | POA: Diagnosis not present

## 2018-04-25 DIAGNOSIS — S0033XD Contusion of nose, subsequent encounter: Secondary | ICD-10-CM | POA: Diagnosis not present

## 2018-04-25 DIAGNOSIS — I495 Sick sinus syndrome: Secondary | ICD-10-CM | POA: Diagnosis not present

## 2018-04-25 DIAGNOSIS — D7581 Myelofibrosis: Secondary | ICD-10-CM | POA: Diagnosis not present

## 2018-04-25 DIAGNOSIS — Z794 Long term (current) use of insulin: Secondary | ICD-10-CM | POA: Diagnosis not present

## 2018-04-25 DIAGNOSIS — N390 Urinary tract infection, site not specified: Secondary | ICD-10-CM | POA: Diagnosis not present

## 2018-04-25 DIAGNOSIS — Z950 Presence of cardiac pacemaker: Secondary | ICD-10-CM | POA: Diagnosis not present

## 2018-04-25 DIAGNOSIS — Z9181 History of falling: Secondary | ICD-10-CM | POA: Diagnosis not present

## 2018-04-25 DIAGNOSIS — S12000D Unspecified displaced fracture of first cervical vertebra, subsequent encounter for fracture with routine healing: Secondary | ICD-10-CM | POA: Diagnosis not present

## 2018-04-25 DIAGNOSIS — S022XXD Fracture of nasal bones, subsequent encounter for fracture with routine healing: Secondary | ICD-10-CM | POA: Diagnosis not present

## 2018-04-28 DIAGNOSIS — N390 Urinary tract infection, site not specified: Secondary | ICD-10-CM | POA: Diagnosis not present

## 2018-04-28 DIAGNOSIS — S12000D Unspecified displaced fracture of first cervical vertebra, subsequent encounter for fracture with routine healing: Secondary | ICD-10-CM | POA: Diagnosis not present

## 2018-04-28 DIAGNOSIS — I10 Essential (primary) hypertension: Secondary | ICD-10-CM | POA: Diagnosis not present

## 2018-04-28 DIAGNOSIS — E1165 Type 2 diabetes mellitus with hyperglycemia: Secondary | ICD-10-CM | POA: Diagnosis not present

## 2018-04-28 DIAGNOSIS — D7581 Myelofibrosis: Secondary | ICD-10-CM | POA: Diagnosis not present

## 2018-04-28 DIAGNOSIS — S022XXD Fracture of nasal bones, subsequent encounter for fracture with routine healing: Secondary | ICD-10-CM | POA: Diagnosis not present

## 2018-04-30 DIAGNOSIS — I10 Essential (primary) hypertension: Secondary | ICD-10-CM | POA: Diagnosis not present

## 2018-04-30 DIAGNOSIS — S12000D Unspecified displaced fracture of first cervical vertebra, subsequent encounter for fracture with routine healing: Secondary | ICD-10-CM | POA: Diagnosis not present

## 2018-04-30 DIAGNOSIS — E1165 Type 2 diabetes mellitus with hyperglycemia: Secondary | ICD-10-CM | POA: Diagnosis not present

## 2018-04-30 DIAGNOSIS — N390 Urinary tract infection, site not specified: Secondary | ICD-10-CM | POA: Diagnosis not present

## 2018-04-30 DIAGNOSIS — D7581 Myelofibrosis: Secondary | ICD-10-CM | POA: Diagnosis not present

## 2018-04-30 DIAGNOSIS — S022XXD Fracture of nasal bones, subsequent encounter for fracture with routine healing: Secondary | ICD-10-CM | POA: Diagnosis not present

## 2018-05-01 DIAGNOSIS — N390 Urinary tract infection, site not specified: Secondary | ICD-10-CM | POA: Diagnosis not present

## 2018-05-01 DIAGNOSIS — I10 Essential (primary) hypertension: Secondary | ICD-10-CM | POA: Diagnosis not present

## 2018-05-01 DIAGNOSIS — S12000D Unspecified displaced fracture of first cervical vertebra, subsequent encounter for fracture with routine healing: Secondary | ICD-10-CM | POA: Diagnosis not present

## 2018-05-01 DIAGNOSIS — E1165 Type 2 diabetes mellitus with hyperglycemia: Secondary | ICD-10-CM | POA: Diagnosis not present

## 2018-05-01 DIAGNOSIS — S022XXD Fracture of nasal bones, subsequent encounter for fracture with routine healing: Secondary | ICD-10-CM | POA: Diagnosis not present

## 2018-05-01 DIAGNOSIS — D7581 Myelofibrosis: Secondary | ICD-10-CM | POA: Diagnosis not present

## 2018-05-04 ENCOUNTER — Inpatient Hospital Stay: Payer: Medicare Other | Attending: Hematology and Oncology

## 2018-05-04 DIAGNOSIS — S12000D Unspecified displaced fracture of first cervical vertebra, subsequent encounter for fracture with routine healing: Secondary | ICD-10-CM | POA: Diagnosis not present

## 2018-05-04 DIAGNOSIS — N390 Urinary tract infection, site not specified: Secondary | ICD-10-CM | POA: Diagnosis not present

## 2018-05-04 DIAGNOSIS — D709 Neutropenia, unspecified: Secondary | ICD-10-CM | POA: Diagnosis not present

## 2018-05-04 DIAGNOSIS — D7581 Myelofibrosis: Secondary | ICD-10-CM

## 2018-05-04 DIAGNOSIS — D649 Anemia, unspecified: Secondary | ICD-10-CM | POA: Diagnosis not present

## 2018-05-04 DIAGNOSIS — I10 Essential (primary) hypertension: Secondary | ICD-10-CM | POA: Diagnosis not present

## 2018-05-04 DIAGNOSIS — D473 Essential (hemorrhagic) thrombocythemia: Secondary | ICD-10-CM | POA: Insufficient documentation

## 2018-05-04 DIAGNOSIS — S022XXD Fracture of nasal bones, subsequent encounter for fracture with routine healing: Secondary | ICD-10-CM | POA: Diagnosis not present

## 2018-05-04 DIAGNOSIS — E1165 Type 2 diabetes mellitus with hyperglycemia: Secondary | ICD-10-CM | POA: Diagnosis not present

## 2018-05-04 LAB — CBC WITH DIFFERENTIAL (CANCER CENTER ONLY)
BAND NEUTROPHILS: 0 %
BASOS ABS: 0 10*3/uL (ref 0.0–0.1)
BASOS PCT: 0 %
Blasts: 0 %
EOS ABS: 0 10*3/uL (ref 0.0–0.5)
EOS PCT: 0 %
HCT: 29.7 % — ABNORMAL LOW (ref 38.4–49.9)
HEMOGLOBIN: 8.9 g/dL — AB (ref 13.0–17.1)
Lymphocytes Relative: 37 %
Lymphs Abs: 5.1 10*3/uL — ABNORMAL HIGH (ref 0.9–3.3)
MCH: 23.9 pg — AB (ref 27.2–33.4)
MCHC: 30 g/dL — ABNORMAL LOW (ref 32.0–36.0)
MCV: 79.8 fL (ref 79.3–98.0)
METAMYELOCYTES PCT: 3 %
Monocytes Absolute: 2.1 10*3/uL — ABNORMAL HIGH (ref 0.1–0.9)
Monocytes Relative: 15 %
Myelocytes: 12 %
NEUTROS ABS: 6.5 10*3/uL (ref 1.5–6.5)
Neutrophils Relative %: 33 %
Other: 0 %
PLATELETS: 82 10*3/uL — AB (ref 140–400)
Promyelocytes Relative: 0 %
RBC: 3.72 MIL/uL — AB (ref 4.20–5.82)
RDW: 24.2 % — ABNORMAL HIGH (ref 11.0–14.6)
WBC: 13.7 10*3/uL — AB (ref 4.0–10.3)
nRBC: 66 /100 WBC — ABNORMAL HIGH

## 2018-05-04 LAB — CMP (CANCER CENTER ONLY)
ALBUMIN: 3.4 g/dL — AB (ref 3.5–5.0)
ALT: 20 U/L (ref 0–55)
AST: 55 U/L — AB (ref 5–34)
Alkaline Phosphatase: 130 U/L (ref 40–150)
Anion gap: 9 (ref 3–11)
BUN: 15 mg/dL (ref 7–26)
CHLORIDE: 102 mmol/L (ref 98–109)
CO2: 22 mmol/L (ref 22–29)
Calcium: 9.1 mg/dL (ref 8.4–10.4)
Creatinine: 1.53 mg/dL — ABNORMAL HIGH (ref 0.70–1.30)
GFR, Est AFR Am: 46 mL/min — ABNORMAL LOW (ref >60)
GFR, Estimated: 40 mL/min — ABNORMAL LOW (ref >60)
GLUCOSE: 265 mg/dL — AB (ref 70–140)
Potassium: 4.9 mmol/L (ref 3.5–5.1)
Sodium: 133 mmol/L — ABNORMAL LOW (ref 136–145)
Total Bilirubin: 1.1 mg/dL (ref 0.2–1.2)
Total Protein: 7.3 g/dL (ref 6.4–8.3)

## 2018-05-04 LAB — IRON AND TIBC
Iron: 27 ug/dL — ABNORMAL LOW (ref 42–163)
Saturation Ratios: 10 % — ABNORMAL LOW (ref 42–163)
TIBC: 285 ug/dL (ref 202–409)
UIBC: 258 ug/dL

## 2018-05-04 LAB — FERRITIN: Ferritin: 347 ng/mL — ABNORMAL HIGH (ref 22–316)

## 2018-05-04 LAB — LACTATE DEHYDROGENASE: LDH: 2022 U/L — AB (ref 125–245)

## 2018-05-05 DIAGNOSIS — I10 Essential (primary) hypertension: Secondary | ICD-10-CM | POA: Diagnosis not present

## 2018-05-05 DIAGNOSIS — S022XXD Fracture of nasal bones, subsequent encounter for fracture with routine healing: Secondary | ICD-10-CM | POA: Diagnosis not present

## 2018-05-05 DIAGNOSIS — N390 Urinary tract infection, site not specified: Secondary | ICD-10-CM | POA: Diagnosis not present

## 2018-05-05 DIAGNOSIS — E1165 Type 2 diabetes mellitus with hyperglycemia: Secondary | ICD-10-CM | POA: Diagnosis not present

## 2018-05-05 DIAGNOSIS — D7581 Myelofibrosis: Secondary | ICD-10-CM | POA: Diagnosis not present

## 2018-05-05 DIAGNOSIS — S12000D Unspecified displaced fracture of first cervical vertebra, subsequent encounter for fracture with routine healing: Secondary | ICD-10-CM | POA: Diagnosis not present

## 2018-05-06 ENCOUNTER — Telehealth: Payer: Self-pay

## 2018-05-06 ENCOUNTER — Inpatient Hospital Stay (HOSPITAL_BASED_OUTPATIENT_CLINIC_OR_DEPARTMENT_OTHER): Payer: Medicare Other | Admitting: Hematology and Oncology

## 2018-05-06 ENCOUNTER — Encounter: Payer: Self-pay | Admitting: Hematology and Oncology

## 2018-05-06 VITALS — BP 133/70 | HR 70 | Temp 97.6°F | Resp 18 | Ht 64.0 in | Wt 147.0 lb

## 2018-05-06 DIAGNOSIS — D473 Essential (hemorrhagic) thrombocythemia: Secondary | ICD-10-CM

## 2018-05-06 DIAGNOSIS — D508 Other iron deficiency anemias: Secondary | ICD-10-CM

## 2018-05-06 DIAGNOSIS — D7581 Myelofibrosis: Secondary | ICD-10-CM | POA: Diagnosis not present

## 2018-05-06 DIAGNOSIS — D709 Neutropenia, unspecified: Secondary | ICD-10-CM | POA: Diagnosis not present

## 2018-05-06 DIAGNOSIS — D649 Anemia, unspecified: Secondary | ICD-10-CM | POA: Diagnosis not present

## 2018-05-06 MED ORDER — RUXOLITINIB PHOSPHATE 10 MG PO TABS: 10.0000 mg | ORAL_TABLET | Freq: Two times a day (BID) | ORAL | 2 refills | Status: DC

## 2018-05-06 MED ORDER — FERROUS SULFATE 325 (65 FE) MG PO TBEC: 325.0000 mg | DELAYED_RELEASE_TABLET | Freq: Three times a day (TID) | ORAL | 2 refills | Status: DC

## 2018-05-06 NOTE — Telephone Encounter (Signed)
Printed avs and calender of upcoming appointment. Per 5/18 los 

## 2018-05-07 DIAGNOSIS — D7581 Myelofibrosis: Secondary | ICD-10-CM | POA: Diagnosis not present

## 2018-05-07 DIAGNOSIS — E1165 Type 2 diabetes mellitus with hyperglycemia: Secondary | ICD-10-CM | POA: Diagnosis not present

## 2018-05-07 DIAGNOSIS — S022XXD Fracture of nasal bones, subsequent encounter for fracture with routine healing: Secondary | ICD-10-CM | POA: Diagnosis not present

## 2018-05-07 DIAGNOSIS — I10 Essential (primary) hypertension: Secondary | ICD-10-CM | POA: Diagnosis not present

## 2018-05-07 DIAGNOSIS — N390 Urinary tract infection, site not specified: Secondary | ICD-10-CM | POA: Diagnosis not present

## 2018-05-07 DIAGNOSIS — S12000D Unspecified displaced fracture of first cervical vertebra, subsequent encounter for fracture with routine healing: Secondary | ICD-10-CM | POA: Diagnosis not present

## 2018-05-11 DIAGNOSIS — D7581 Myelofibrosis: Secondary | ICD-10-CM | POA: Diagnosis not present

## 2018-05-11 DIAGNOSIS — I10 Essential (primary) hypertension: Secondary | ICD-10-CM | POA: Diagnosis not present

## 2018-05-11 DIAGNOSIS — S12000D Unspecified displaced fracture of first cervical vertebra, subsequent encounter for fracture with routine healing: Secondary | ICD-10-CM | POA: Diagnosis not present

## 2018-05-11 DIAGNOSIS — S022XXD Fracture of nasal bones, subsequent encounter for fracture with routine healing: Secondary | ICD-10-CM | POA: Diagnosis not present

## 2018-05-11 DIAGNOSIS — E1165 Type 2 diabetes mellitus with hyperglycemia: Secondary | ICD-10-CM | POA: Diagnosis not present

## 2018-05-11 DIAGNOSIS — N390 Urinary tract infection, site not specified: Secondary | ICD-10-CM | POA: Diagnosis not present

## 2018-05-13 DIAGNOSIS — I10 Essential (primary) hypertension: Secondary | ICD-10-CM | POA: Diagnosis not present

## 2018-05-13 DIAGNOSIS — S12000D Unspecified displaced fracture of first cervical vertebra, subsequent encounter for fracture with routine healing: Secondary | ICD-10-CM | POA: Diagnosis not present

## 2018-05-13 DIAGNOSIS — D7581 Myelofibrosis: Secondary | ICD-10-CM | POA: Diagnosis not present

## 2018-05-13 DIAGNOSIS — E1165 Type 2 diabetes mellitus with hyperglycemia: Secondary | ICD-10-CM | POA: Diagnosis not present

## 2018-05-13 DIAGNOSIS — S022XXD Fracture of nasal bones, subsequent encounter for fracture with routine healing: Secondary | ICD-10-CM | POA: Diagnosis not present

## 2018-05-13 DIAGNOSIS — N390 Urinary tract infection, site not specified: Secondary | ICD-10-CM | POA: Diagnosis not present

## 2018-05-14 ENCOUNTER — Telehealth: Payer: Self-pay | Admitting: Family Medicine

## 2018-05-14 DIAGNOSIS — N390 Urinary tract infection, site not specified: Secondary | ICD-10-CM | POA: Diagnosis not present

## 2018-05-14 DIAGNOSIS — E1165 Type 2 diabetes mellitus with hyperglycemia: Secondary | ICD-10-CM | POA: Diagnosis not present

## 2018-05-14 DIAGNOSIS — S022XXD Fracture of nasal bones, subsequent encounter for fracture with routine healing: Secondary | ICD-10-CM | POA: Diagnosis not present

## 2018-05-14 DIAGNOSIS — D7581 Myelofibrosis: Secondary | ICD-10-CM | POA: Diagnosis not present

## 2018-05-14 DIAGNOSIS — S12000D Unspecified displaced fracture of first cervical vertebra, subsequent encounter for fracture with routine healing: Secondary | ICD-10-CM | POA: Diagnosis not present

## 2018-05-14 DIAGNOSIS — I10 Essential (primary) hypertension: Secondary | ICD-10-CM | POA: Diagnosis not present

## 2018-05-14 NOTE — Telephone Encounter (Signed)
She might need evaluation for etiology of fall and possible implications of fall.

## 2018-05-14 NOTE — Telephone Encounter (Signed)
He has an appointment with you tomorrow

## 2018-05-14 NOTE — Telephone Encounter (Signed)
The PT from [facility] called and stated that the patient had a fall over the weekend.

## 2018-05-15 ENCOUNTER — Encounter: Payer: Self-pay | Admitting: Family Medicine

## 2018-05-15 ENCOUNTER — Ambulatory Visit: Payer: Medicare Other | Attending: Family Medicine | Admitting: Family Medicine

## 2018-05-15 VITALS — BP 168/77 | HR 61 | Temp 98.0°F | Resp 18 | Ht 64.0 in | Wt 147.0 lb

## 2018-05-15 DIAGNOSIS — E119 Type 2 diabetes mellitus without complications: Secondary | ICD-10-CM | POA: Insufficient documentation

## 2018-05-15 DIAGNOSIS — R413 Other amnesia: Secondary | ICD-10-CM | POA: Diagnosis not present

## 2018-05-15 DIAGNOSIS — X58XXXA Exposure to other specified factors, initial encounter: Secondary | ICD-10-CM | POA: Insufficient documentation

## 2018-05-15 DIAGNOSIS — Z7982 Long term (current) use of aspirin: Secondary | ICD-10-CM | POA: Insufficient documentation

## 2018-05-15 DIAGNOSIS — Z79899 Other long term (current) drug therapy: Secondary | ICD-10-CM | POA: Diagnosis not present

## 2018-05-15 DIAGNOSIS — Z794 Long term (current) use of insulin: Secondary | ICD-10-CM | POA: Diagnosis not present

## 2018-05-15 DIAGNOSIS — Z9889 Other specified postprocedural states: Secondary | ICD-10-CM | POA: Insufficient documentation

## 2018-05-15 DIAGNOSIS — R55 Syncope and collapse: Secondary | ICD-10-CM | POA: Diagnosis not present

## 2018-05-15 DIAGNOSIS — S022XXA Fracture of nasal bones, initial encounter for closed fracture: Secondary | ICD-10-CM | POA: Diagnosis not present

## 2018-05-15 DIAGNOSIS — R42 Dizziness and giddiness: Secondary | ICD-10-CM | POA: Insufficient documentation

## 2018-05-15 DIAGNOSIS — I10 Essential (primary) hypertension: Secondary | ICD-10-CM | POA: Insufficient documentation

## 2018-05-15 DIAGNOSIS — S12001A Unspecified nondisplaced fracture of first cervical vertebra, initial encounter for closed fracture: Secondary | ICD-10-CM

## 2018-05-15 DIAGNOSIS — D7581 Myelofibrosis: Secondary | ICD-10-CM

## 2018-05-15 DIAGNOSIS — IMO0001 Reserved for inherently not codable concepts without codable children: Secondary | ICD-10-CM

## 2018-05-15 DIAGNOSIS — E1165 Type 2 diabetes mellitus with hyperglycemia: Secondary | ICD-10-CM

## 2018-05-15 LAB — GLUCOSE, POCT (MANUAL RESULT ENTRY): POC Glucose: 314 mg/dl — AB (ref 70–99)

## 2018-05-15 MED ORDER — INSULIN GLARGINE 100 UNIT/ML SOLOSTAR PEN: 40.0000 [IU] | PEN_INJECTOR | SUBCUTANEOUS | 3 refills | Status: DC

## 2018-05-15 MED ORDER — MEMANTINE HCL 5 MG PO TABS: 10.0000 mg | ORAL_TABLET | Freq: Two times a day (BID) | ORAL | 3 refills | Status: DC

## 2018-05-15 MED ORDER — MEMANTINE HCL 10 MG PO TABS: 10.0000 mg | ORAL_TABLET | Freq: Two times a day (BID) | ORAL | 3 refills | Status: DC

## 2018-05-15 NOTE — Patient Instructions (Signed)
Diabetes Mellitus and Nutrition When you have diabetes (diabetes mellitus), it is very important to have healthy eating habits because your blood sugar (glucose) levels are greatly affected by what you eat and drink. Eating healthy foods in the appropriate amounts, at about the same times every day, can help you:  Control your blood glucose.  Lower your risk of heart disease.  Improve your blood pressure.  Reach or maintain a healthy weight.  Every person with diabetes is different, and each person has different needs for a meal plan. Your health care provider may recommend that you work with a diet and nutrition specialist (dietitian) to make a meal plan that is best for you. Your meal plan may vary depending on factors such as:  The calories you need.  The medicines you take.  Your weight.  Your blood glucose, blood pressure, and cholesterol levels.  Your activity level.  Other health conditions you have, such as heart or kidney disease.  How do carbohydrates affect me? Carbohydrates affect your blood glucose level more than any other type of food. Eating carbohydrates naturally increases the amount of glucose in your blood. Carbohydrate counting is a method for keeping track of how many carbohydrates you eat. Counting carbohydrates is important to keep your blood glucose at a healthy level, especially if you use insulin or take certain oral diabetes medicines. It is important to know how many carbohydrates you can safely have in each meal. This is different for every person. Your dietitian can help you calculate how many carbohydrates you should have at each meal and for snack. Foods that contain carbohydrates include:  Bread, cereal, rice, pasta, and crackers.  Potatoes and corn.  Peas, beans, and lentils.  Milk and yogurt.  Fruit and juice.  Desserts, such as cakes, cookies, ice cream, and candy.  How does alcohol affect me? Alcohol can cause a sudden decrease in blood  glucose (hypoglycemia), especially if you use insulin or take certain oral diabetes medicines. Hypoglycemia can be a life-threatening condition. Symptoms of hypoglycemia (sleepiness, dizziness, and confusion) are similar to symptoms of having too much alcohol. If your health care provider says that alcohol is safe for you, follow these guidelines:  Limit alcohol intake to no more than 1 drink per day for nonpregnant women and 2 drinks per day for men. One drink equals 12 oz of beer, 5 oz of wine, or 1 oz of hard liquor.  Do not drink on an empty stomach.  Keep yourself hydrated with water, diet soda, or unsweetened iced tea.  Keep in mind that regular soda, juice, and other mixers may contain a lot of sugar and must be counted as carbohydrates.  What are tips for following this plan? Reading food labels  Start by checking the serving size on the label. The amount of calories, carbohydrates, fats, and other nutrients listed on the label are based on one serving of the food. Many foods contain more than one serving per package.  Check the total grams (g) of carbohydrates in one serving. You can calculate the number of servings of carbohydrates in one serving by dividing the total carbohydrates by 15. For example, if a food has 30 g of total carbohydrates, it would be equal to 2 servings of carbohydrates.  Check the number of grams (g) of saturated and trans fats in one serving. Choose foods that have low or no amount of these fats.  Check the number of milligrams (mg) of sodium in one serving. Most people   should limit total sodium intake to less than 2,300 mg per day.  Always check the nutrition information of foods labeled as "low-fat" or "nonfat". These foods may be higher in added sugar or refined carbohydrates and should be avoided.  Talk to your dietitian to identify your daily goals for nutrients listed on the label. Shopping  Avoid buying canned, premade, or processed foods. These  foods tend to be high in fat, sodium, and added sugar.  Shop around the outside edge of the grocery store. This includes fresh fruits and vegetables, bulk grains, fresh meats, and fresh dairy. Cooking  Use low-heat cooking methods, such as baking, instead of high-heat cooking methods like deep frying.  Cook using healthy oils, such as olive, canola, or sunflower oil.  Avoid cooking with butter, cream, or high-fat meats. Meal planning  Eat meals and snacks regularly, preferably at the same times every day. Avoid going long periods of time without eating.  Eat foods high in fiber, such as fresh fruits, vegetables, beans, and whole grains. Talk to your dietitian about how many servings of carbohydrates you can eat at each meal.  Eat 4-6 ounces of lean protein each day, such as lean meat, chicken, fish, eggs, or tofu. 1 ounce is equal to 1 ounce of meat, chicken, or fish, 1 egg, or 1/4 cup of tofu.  Eat some foods each day that contain healthy fats, such as avocado, nuts, seeds, and fish. Lifestyle   Check your blood glucose regularly.  Exercise at least 30 minutes 5 or more days each week, or as told by your health care provider.  Take medicines as told by your health care provider.  Do not use any products that contain nicotine or tobacco, such as cigarettes and e-cigarettes. If you need help quitting, ask your health care provider.  Work with a counselor or diabetes educator to identify strategies to manage stress and any emotional and social challenges. What are some questions to ask my health care provider?  Do I need to meet with a diabetes educator?  Do I need to meet with a dietitian?  What number can I call if I have questions?  When are the best times to check my blood glucose? Where to find more information:  American Diabetes Association: diabetes.org/food-and-fitness/food  Academy of Nutrition and Dietetics:  www.eatright.org/resources/health/diseases-and-conditions/diabetes  National Institute of Diabetes and Digestive and Kidney Diseases (NIH): www.niddk.nih.gov/health-information/diabetes/overview/diet-eating-physical-activity Summary  A healthy meal plan will help you control your blood glucose and maintain a healthy lifestyle.  Working with a diet and nutrition specialist (dietitian) can help you make a meal plan that is best for you.  Keep in mind that carbohydrates and alcohol have immediate effects on your blood glucose levels. It is important to count carbohydrates and to use alcohol carefully. This information is not intended to replace advice given to you by your health care provider. Make sure you discuss any questions you have with your health care provider. Document Released: 09/05/2005 Document Revised: 01/13/2017 Document Reviewed: 01/13/2017 Elsevier Interactive Patient Education  2018 [employer]  

## 2018-05-15 NOTE — Progress Notes (Signed)
Subjective:  Patient ID: Aaron Nelson, male    DOB: 1930-12-07  Age: 84 y.o. MRN: [id number]  CC: Establish Care (DM)   HPI Aaron Nelson is an 84 year old male with a history of Hypertension, Type 2 Diabetes Mellitus (A1C 11.4), sinus node dysfunction (s/p pacemaker )Myelofibrosis here to establish care with me. He was hospitalized at [facility] from 04/21/18-04/23/18 for syncope. Labs revealed mild leukocytosis, CT neck revealed non displaced C1 fracture for which he was placed on a neck collar, maxillofacial CT revealed well displaced nasal bone fracture. CT head revealed no acute abnormality. His Ruxolitinib was held during his stay as well as his antihypertensives. Presyncope was thought to secondary to orthostatic hypotension. He received PT/OT.  He comes in accompanied by his son and had another fall a few days ago. Son states the patient forgets to use his walker when trying to walk but the patient denies dizziness.Alos complains the patient has had some memory problems. His blood sugar is 314 today and he did not take his Lantus today as his son informs he receives it every other day. He does not have an appointment with any of the specialists. His Ruxolitinib was restarted at his visit with his Oncologist on 05/06/18.  Past Medical History:  Diagnosis Date  . Diabetes mellitus without complication ([place])   . Hypertension   . Myelodysplasia (myelodysplastic syndrome) ([place])   . S/P placement of cardiac pacemaker 08/30/2017  . Sinus arrest   . Syncope   . Syncope and collapse 08/30/2017    Past Surgical History:  Procedure Laterality Date  . BONE MARROW BIOPSY  2017  . LOOP RECORDER REMOVAL N/A 08/29/2017   Procedure: LOOP RECORDER REMOVAL;  Surgeon: Deboraha Sprang, MD;  Location: [place] [facility];  Service: Cardiovascular;  Laterality: N/A;  . PACEMAKER IMPLANT Left 08/29/2017   St Jude generator  . PACEMAKER IMPLANT N/A 08/29/2017   Procedure: Pacemaker Implant;  Surgeon:  Deboraha Sprang, MD;  Location: [place] [facility];  Service: Cardiovascular;  Laterality: N/A;  . PORTACATH PLACEMENT  2017    No Known Allergies   Outpatient Medications Prior to Visit  Medication Sig Dispense Refill  . ACCU-CHEK FASTCLIX LANCETS MISC Use as directed 3 times daily. E11.9 100 each 12  . acetaminophen (TYLENOL) 325 MG tablet Take 2 tablets (650 mg total) by mouth every 4 (four) hours as needed for mild pain.    Marland Kitchen amLODipine (NORVASC) 10 MG tablet TAKE 1 TABLET BY MOUTH DAILY 90 tablet 3  . aspirin EC 81 MG tablet Take 81 mg by mouth daily.    . Blood Glucose Monitoring Suppl (ACCU-CHEK AVIVA PLUS) w/Device KIT Use as directed 3 times daily. E11.9 1 kit 0  . ferrous sulfate 325 (65 FE) MG EC tablet Take 1 tablet (325 mg total) by mouth 3 (three) times daily with meals. 90 tablet 2  . glucose blood (ACCU-CHEK AVIVA PLUS) test strip Use as directed 3 times daily. E11.9 100 each 12  . Insulin Pen Needle 31G X 5 MM MISC Use as directed 100 each 3  . insulin regular (HUMULIN R) 100 units/mL injection Inject 0.1 mLs (10 Units total) into the skin 3 (three) times daily before meals. For Blood Sugar greater than 250 10 mL 11  . NITROSTAT 0.4 MG SL tablet Place 1 tablet (0.4 mg total) under the tongue every 5 (five) minutes as needed for chest pain. 25 tablet 3  . ruxolitinib phosphate (JAKAFI) 10  MG tablet Take 1 tablet (10 mg total) by mouth 2 (two) times daily. 60 tablet 2  . tamsulosin (FLOMAX) 0.4 MG CAPS capsule Take 1 capsule (0.4 mg total) by mouth daily. 30 capsule 3  . Insulin Glargine (LANTUS SOLOSTAR) 100 UNIT/ML Solostar Pen Inject 50 Units into the skin every morning. 15 mL 3  . nitrofurantoin, macrocrystal-monohydrate, (MACROBID) 100 MG capsule Take 1 capsule (100 mg total) by mouth 2 (two) times daily. 14 capsule 0   No facility-administered medications prior to visit.     ROS Review of Systems  Constitutional: Negative for activity change and appetite change.    HENT: Negative for sinus pressure and sore throat.   Eyes: Negative for visual disturbance.  Respiratory: Negative for cough, chest tightness and shortness of breath.   Cardiovascular: Negative for chest pain and leg swelling.  Gastrointestinal: Negative for abdominal distention, abdominal pain, constipation and diarrhea.  Endocrine: Negative.   Genitourinary: Negative for dysuria.  Musculoskeletal: Negative for joint swelling and myalgias.  Skin: Negative for rash.  Allergic/Immunologic: Negative.   Neurological: Negative for weakness, light-headedness and numbness.  Psychiatric/Behavioral: Negative for dysphoric mood and suicidal ideas.    Objective:  BP (!) 168/77 (BP Location: Left Arm, Patient Position: Sitting, Cuff Size: Normal)   Pulse 61   Temp 98 F (36.7 C) (Oral)   Resp 18   Ht '5\' 4"'  (1.626 m)   Wt 147 lb (66.7 kg)   SpO2 99%   BMI 25.23 kg/m   BP/Weight 05/15/2018 9/74/1638 03/27/3645  Systolic BP 803 212 248  Diastolic BP 77 70 62  Wt. (Lbs) 147 147 -  BMI 25.23 25.23 -      Physical Exam  Constitutional: He is oriented to person, place, and time. He appears well-developed and well-nourished.  Sitting comfortably in a wheelchair  Neck:  Neck collar in place  Cardiovascular: Normal rate, normal heart sounds and intact distal pulses.  No murmur heard. Pulmonary/Chest: Effort normal and breath sounds normal. He has no wheezes. He has no rales. He exhibits no tenderness.  Abdominal: Soft. Bowel sounds are normal. He exhibits no distension and no mass. There is no tenderness.  Musculoskeletal: Normal range of motion.  Neurological: He is alert and oriented to person, place, and time.     CMP Latest Ref Rng & Units 05/04/2018 04/23/2018 04/22/2018  Glucose 70 - 140 mg/dL 265(H) 198(H) 158(H)  BUN 7 - 26 mg/dL 15 22(H) 20  Creatinine 0.70 - 1.30 mg/dL 1.53(H) 1.31(H) 1.20  Sodium 136 - 145 mmol/L 133(L) 135 137  Potassium 3.5 - 5.1 mmol/L 4.9 4.5 4.1  Chloride  98 - 109 mmol/L 102 105 106  CO2 22 - 29 mmol/L '22 22 22  ' Calcium 8.4 - 10.4 mg/dL 9.1 8.8(L) 8.7(L)  Total Protein 6.4 - 8.3 g/dL 7.3 - -  Total Bilirubin 0.2 - 1.2 mg/dL 1.1 - -  Alkaline Phos 40 - 150 U/L 130 - -  AST 5 - 34 U/L 55(H) - -  ALT 0 - 55 U/L 20 - -    CBC    Component Value Date/Time   WBC 13.7 (H) 05/04/2018 0742   WBC 11.4 (H) 04/23/2018 0445   RBC 3.72 (L) 05/04/2018 0742   HGB 8.9 (L) 05/04/2018 0742   HGB 9.8 (L) 11/19/2017 1059   HCT 29.7 (L) 05/04/2018 0742   HCT 32.2 (L) 11/19/2017 1059   PLT 82 (L) 05/04/2018 0742   PLT 240 11/19/2017 1059   PLT 375  06/11/2017 1232   MCV 79.8 05/04/2018 0742   MCV 74.9 (L) 11/19/2017 1059   MCH 23.9 (L) 05/04/2018 0742   MCHC 30.0 (L) 05/04/2018 0742   RDW 24.2 (H) 05/04/2018 0742   RDW 26.2 (H) 11/19/2017 1059   LYMPHSABS 5.1 (H) 05/04/2018 0742   LYMPHSABS 1.0 06/11/2017 1232   MONOABS 2.1 (H) 05/04/2018 0742   EOSABS 0.0 05/04/2018 0742   EOSABS 0.0 06/11/2017 1232   BASOSABS 0.0 05/04/2018 0742   BASOSABS 0.0 06/11/2017 1232    Lab Results  Component Value Date   HGBA1C 11.4 04/08/2018    Assessment & Plan:   1. Uncontrolled type 2 diabetes mellitus without complication, with long-term current use of insulin (HCC) Uncontrolled with A1C of 11.4 due to non compliance Son has been adjusting his regimen Will cut back on Lantus from 50 to 40 units and review blood sugar at next visit Diabetic diet - Glucose (CBG) - Insulin Glargine (LANTUS SOLOSTAR) 100 UNIT/ML Solostar Pen; Inject 40 Units into the skin every morning.  Dispense: 15 mL; Refill: 3  2. Essential hypertension Uncontrolled Hold off on adjusting Hypertensive regimen due to frequent falls and history of orthostatic hypotension  3. Myelofibrosis (HCC) Continue Ruxolitinib Followed by Hematology  4. Postural dizziness with presyncope Advised to change positions slowly He is high risk for falls Falls also as a result of his forgetting to  use his walker - commenced on Namenda for memory loss  5. Closed nondisplaced fracture of first cervical vertebra, unspecified fracture morphology, initial encounter ([place]) Continue with c-spine collar until visit with Neurosurgery - Ambulatory referral to Neurosurgery  6. Nasal fracture Referred to maxilliofacial surgery Meds ordered this encounter  Medications  . Insulin Glargine (LANTUS SOLOSTAR) 100 UNIT/ML Solostar Pen    Sig: Inject 40 Units into the skin every morning.    Dispense:  15 mL    Refill:  3  . DISCONTD: memantine (NAMENDA) 5 MG tablet    Sig: Take 2 tablets (10 mg total) by mouth 2 (two) times daily.    Dispense:  60 tablet    Refill:  3  . memantine (NAMENDA) 10 MG tablet    Sig: Take 1 tablet (10 mg total) by mouth 2 (two) times daily.    Dispense:  60 tablet    Refill:  3    Discontinue previous script    Follow-up: Return in about 3 months (around 08/15/2018) for follow up of Diabetes and Hypertension.   Charlott Rakes MD

## 2018-05-18 DIAGNOSIS — N390 Urinary tract infection, site not specified: Secondary | ICD-10-CM | POA: Diagnosis not present

## 2018-05-18 DIAGNOSIS — D7581 Myelofibrosis: Secondary | ICD-10-CM | POA: Diagnosis not present

## 2018-05-18 DIAGNOSIS — S12000D Unspecified displaced fracture of first cervical vertebra, subsequent encounter for fracture with routine healing: Secondary | ICD-10-CM | POA: Diagnosis not present

## 2018-05-18 DIAGNOSIS — I10 Essential (primary) hypertension: Secondary | ICD-10-CM | POA: Diagnosis not present

## 2018-05-18 DIAGNOSIS — S022XXD Fracture of nasal bones, subsequent encounter for fracture with routine healing: Secondary | ICD-10-CM | POA: Diagnosis not present

## 2018-05-18 DIAGNOSIS — E1165 Type 2 diabetes mellitus with hyperglycemia: Secondary | ICD-10-CM | POA: Diagnosis not present

## 2018-05-20 DIAGNOSIS — I10 Essential (primary) hypertension: Secondary | ICD-10-CM | POA: Diagnosis not present

## 2018-05-20 DIAGNOSIS — E1165 Type 2 diabetes mellitus with hyperglycemia: Secondary | ICD-10-CM | POA: Diagnosis not present

## 2018-05-20 DIAGNOSIS — D7581 Myelofibrosis: Secondary | ICD-10-CM | POA: Diagnosis not present

## 2018-05-20 DIAGNOSIS — S12000D Unspecified displaced fracture of first cervical vertebra, subsequent encounter for fracture with routine healing: Secondary | ICD-10-CM | POA: Diagnosis not present

## 2018-05-20 DIAGNOSIS — S022XXD Fracture of nasal bones, subsequent encounter for fracture with routine healing: Secondary | ICD-10-CM | POA: Diagnosis not present

## 2018-05-20 DIAGNOSIS — N390 Urinary tract infection, site not specified: Secondary | ICD-10-CM | POA: Diagnosis not present

## 2018-05-21 DIAGNOSIS — S022XXD Fracture of nasal bones, subsequent encounter for fracture with routine healing: Secondary | ICD-10-CM | POA: Diagnosis not present

## 2018-05-21 DIAGNOSIS — N390 Urinary tract infection, site not specified: Secondary | ICD-10-CM | POA: Diagnosis not present

## 2018-05-21 DIAGNOSIS — E1165 Type 2 diabetes mellitus with hyperglycemia: Secondary | ICD-10-CM | POA: Diagnosis not present

## 2018-05-21 DIAGNOSIS — I10 Essential (primary) hypertension: Secondary | ICD-10-CM | POA: Diagnosis not present

## 2018-05-21 DIAGNOSIS — S12000D Unspecified displaced fracture of first cervical vertebra, subsequent encounter for fracture with routine healing: Secondary | ICD-10-CM | POA: Diagnosis not present

## 2018-05-21 DIAGNOSIS — D7581 Myelofibrosis: Secondary | ICD-10-CM | POA: Diagnosis not present

## 2018-05-21 NOTE — Progress Notes (Signed)
[facility] [facility] Follow-up Visit:  Assessment: Myelofibrosis [facility]) 84 y.o. male with diagnosis of primary myelofibrosis with associated neutropenia without recurrent infections, anemia not requiring transfusions and thrombocytosis. Patient has been having hematological abnormalities from extended period of time, the earliest noted 2005. Patient underwent at least 2 bone marrow biopsy evaluations, most recent on 01/09/17 documenting the recurrent condition. IPSS score 3 -- high risk, mainly due to advanced age of the patient, anemia, and some systemic symptoms attribution of which is a little bit challenging. Patient's somnolence is not clearly related to myelofibrosis. He does not appear to have any problems with splenomegaly based on physical exam or symptoms and his appetite is excellent.   Patient reported overall improvement in his symptoms since initiation of ruxolitinib.  Hematological profile showed progressive worsening of the white blood cell count demonstrating elevated neutrophils, and significant left shift with presence of immature forms with up to 2% blasts.  Since the last visit to the clinic, patient sustained a fall precipitated by lightheadedness and dizziness.  Fall resulted in nasal fracture as well as a fracture of C1 for which patient was supposed to wear collar which he was not compliant with.  Today, I have reemphasized compliance with the collar until a orthopedic doctor or neurosurgeon tells the patient otherwise.  Additionally, on discharge from the hospital, ruxolitinib was discontinued by admitting physician.  This was done without consulting with hematology as far as I can tell.  Of note, ruxolitinib should never be discontinued abruptly and should be tapered as abrupt discontinuation can result in rapid recurrence and exacerbation of symptoms of myelofibrosis.  This is pretty evident with our patient at this point in time.  Plan: - Previously I have advised  infectious prophylaxis with acyclovir, fluconazole, and Bactrim, but patient is hesitant to increase the medication burden at this point in time. --Resume ruxolitinib 62m PO BID - Start ferrous sulfate supplementation as the current level of ferritin in the context of myelodysplastic syndrome is likely consistent with iron deficiency --Return to clinic in 2 months: Labs 2 to 3 days prior, clinic visit for hematological monitoring and ruxolitinib toxicity  Voice recognition software was used and creation of this note. Despite my best effort at editing the text, some misspelling/errors may have occurred.   Orders Placed This Encounter  Procedures  . CBC with Differential (Cancer Center Only)    Standing Status:   Future    Standing Expiration Date:   05/07/2019  . C[place] ([facility]    Standing Status:   Future    Standing Expiration Date:   05/07/2019  . Lactate dehydrogenase (LDH)    Standing Status:   Future    Standing Expiration Date:   05/06/2019  . Iron and TIBC    Standing Status:   Future    Standing Expiration Date:   05/07/2019  . Ferritin    Standing Status:   Future    Standing Expiration Date:   05/07/2019    Cancer Staging No matching staging information was found for the patient.  All questions were answered.  . The patient knows to call the clinic with any problems, questions or concerns.  This note was electronically signed.    History of Presenting Illness [id number] Patient returns to the clinic to discuss on review of the previous records. No new complaints since last visit to the clinic. It appears that he no longer  experiences headaches, lightheadedness, or dizziness as his blood pressure control has improved.  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 Initially, patient was  treated with lenalidomide, but experienced a syncopal event medication was discontinued. Subsequently, patient received no additional therapy. At this time, his son describes excessive somnolence with patient being able to fall asleep in the middle of the day while reading or while watching TV. Nevertheless, patient does not full asleep while engaged with others. He appears to rest well through the night as well without snoring. He wakes up only to urinate. Patient denies any fevers, chills, night sweats. No weight loss, no abdominal pain, early satiety. His appetite is currently excellent. He does take describe dyspnea with exertion, but he is able to maintain reasonable activity level that he is satisfied with.  Patient returns to the clinic today for continued hematological monitoring.  Since last visit to the clinic, patient sustained a fall on 04/21/2018 due to dizziness resulting in loss of balance.  Patient had a lot of injuries, but most notably fractured his nasal bone and C1 vertebral fracture.  Patient supposed to wear collar at home, but has not been compliant with instructions.  Denies any new neurological deficits in the upper or lower extremities.  Denies any shortness of breath or chest pain.  On discharge, ruxolitinib was discontinued out of concern for possible contribution to the dizziness.  Since discontinuation of ruxolitinib, patient and his son noticed significant drop in energy level and decreased appetite.  Hematological History: --BM Bx, 02/04/14: (Size decreased in number microcytosis and polychromasia without rouleaux no nucleated red blood cells. Leukocytosis decreased in number with absolute neutropenia and absolute lymphopenia. No increase in blasts. Platelets are normal in number. Aspirate demonstrates reasonably normal results. Overall cellularity 60% no evidence of lymphomatous infiltrate or increase in blasts. Flow cytometry positive for presence of a small lambda monoclonal  CD5 negative, CD10 negative B-cell population.Cytogen -- 46,XY, del(9)(q13q22) --BM Bx, 01/09/17: Bone marrow aspirate a spicular and contained insufficient particles for evaluation. Peripheral blood smear demonstrated decreased erythrocytes with microcytosis hypochromasia, schistocytes, nucleated red blood cells. Decreased white blood cell count with absolute and relative neutropenia, and relative lymphocytosis and monocytosis. Overall bone marrow cellularity increased at approximately 80-85%, increased number of atypical megakaryocytes with extremely pleomorphic nuclei. Neutrophil maturation is partially arrested, no increase of blasts. Increased fibrosis. --Labs, 01/01/18: WBC   8.6, ANC 4.3, ALC 1.8, Mono 1.8, Eos 0.2, Baso 0.7, Hgb 9.0, MCV 75.6, MCH 23.1,        RDW 25.8, nRBC 33%, Plt 120; LFTs WNL, LDH 1562, Uric acid 7.9; --Labs, 01/29/18: WBC 10.3,              Hgb 8.3, MCV 77.0, MCH 23.2, MCHC 30.2, RDW 26.2, nRBC 55%, Plt 214; Na 132, Glc 317, Cr 1.62, Bicarb 22, Alb 3.2, AST 35 --Labs, 03/02/18: WBC 11.5, ANC 9.8, ALC 3.8, Mono 1.4, Baso 0.8, Blast 2%, Hgb 8.6, nRBC 48%, Plt 128; Fe 67, FeSat 23, TIBC 299, Ferritin 405; AST 44, ALT 26 --Labs, 05/04/18: WBC 13.7, ANC 6.5, ALC 5.1, Mono 2.1, Baso 0.0, Blast 0%, Hgb 8.9, nRBC 66%, Plt   82; Fe 27, FeSat 10, TIBC 285, Ferritin 347; AST 55, ALT 20, LDH 2022   Treatment Hx: --Lenalidomide, 02/05/17: Medication held single day of administration due to hospitalization with syncope. --Ruxolitinib (Jakafi) 97m PO BID, 11/19/17-:   Medical History: Past Medical  History:  Diagnosis Date  . Diabetes mellitus without complication ([place])   . Hypertension   . Myelodysplasia (myelodysplastic syndrome) ([place])   . S/P placement of cardiac pacemaker 08/30/2017  . Sinus arrest   . Syncope   . Syncope and collapse 08/30/2017    Surgical History: Past Surgical History:  Procedure Laterality Date  . BONE MARROW BIOPSY  2017  . LOOP RECORDER REMOVAL N/A  08/29/2017   Procedure: LOOP RECORDER REMOVAL;  Surgeon: Deboraha Sprang, MD;  Location: [facility];  Service: Cardiovascular;  Laterality: N/A;  . PACEMAKER IMPLANT Left 08/29/2017   St Jude generator  . PACEMAKER IMPLANT N/A 08/29/2017   Procedure: Pacemaker Implant;  Surgeon: Deboraha Sprang, MD;  Location: [facility];  Service: Cardiovascular;  Laterality: N/A;  . PORTACATH PLACEMENT  2017    Family History: Family History  Problem Relation Age of Onset  . Hypertension Mother     Social History: Social History   Socioeconomic History  . Marital status: Divorced    Spouse name: Not on file  . Number of children: Not on file  . Years of education: Not on file  . Highest education level: Not on file  Occupational History  . Not on file  Social Needs  . Financial resource strain: Not on file  . Food insecurity:    Worry: Not on file    Inability: Not on file  . Transportation needs:    Medical: Not on file    Non-medical: Not on file  Tobacco Use  . Smoking status: Never Smoker  . Smokeless tobacco: Never Used  Substance and Sexual Activity  . Alcohol use: No  . Drug use: No  . Sexual activity: Not Currently  Lifestyle  . Physical activity:    Days per week: Not on file    Minutes per session: Not on file  . Stress: Not on file  Relationships  . Social connections:    Talks on phone: Not on file    Gets together: Not on file    Attends religious service: Not on file    Active member of club or organization: Not on file    Attends meetings of clubs or organizations: Not on file    Relationship status: Not on file  . Intimate partner violence:    Fear of current or ex partner: Not on file    Emotionally abused: Not on file    Physically abused: Not on file    Forced sexual activity: Not on file  Other Topics Concern  . Not on file  Social History Narrative  . Not on file    Allergies: No Known Allergies  Medications:  Current Outpatient  Medications  Medication Sig Dispense Refill  . ACCU-CHEK FASTCLIX LANCETS MISC Use as directed 3 times daily. E11.9 100 each 12  . acetaminophen (TYLENOL) 325 MG tablet Take 2 tablets (650 mg total) by mouth every 4 (four) hours as needed for mild pain.    Marland Kitchen amLODipine (NORVASC) 10 MG tablet TAKE 1 TABLET BY MOUTH DAILY 90 tablet 3  . aspirin EC 81 MG tablet Take 81 mg by mouth daily.    . Blood Glucose Monitoring Suppl (ACCU-CHEK AVIVA PLUS) w/Device KIT Use as directed 3 times daily. E11.9 1 kit 0  . ferrous sulfate 325 (65 FE) MG EC tablet Take 1 tablet (325 mg total) by mouth 3 (three) times daily with meals. 90 tablet 2  . glucose blood (ACCU-CHEK AVIVA  PLUS) test strip Use as directed 3 times daily. E11.9 100 each 12  . Insulin Glargine (LANTUS SOLOSTAR) 100 UNIT/ML Solostar Pen Inject 40 Units into the skin every morning. 15 mL 3  . Insulin Pen Needle 31G X 5 MM MISC Use as directed 100 each 3  . insulin regular (HUMULIN R) 100 units/mL injection Inject 0.1 mLs (10 Units total) into the skin 3 (three) times daily before meals. For Blood Sugar greater than 250 10 mL 11  . memantine (NAMENDA) 10 MG tablet Take 1 tablet (10 mg total) by mouth 2 (two) times daily. 60 tablet 3  . NITROSTAT 0.4 MG SL tablet Place 1 tablet (0.4 mg total) under the tongue every 5 (five) minutes as needed for chest pain. 25 tablet 3  . ruxolitinib phosphate (JAKAFI) 10 MG tablet Take 1 tablet (10 mg total) by mouth 2 (two) times daily. 60 tablet 2  . tamsulosin (FLOMAX) 0.4 MG CAPS capsule Take 1 capsule (0.4 mg total) by mouth daily. 30 capsule 3   No current facility-administered medications for this visit.     Review of Systems: Review of Systems  Constitutional: Positive for appetite change and fatigue.       Patient and his son report excessive somnolence with the patient being able to follow sleep any time not actively engaged with other people.  Neurological: Positive for dizziness. Negative for seizures  and speech difficulty.  All other systems reviewed and are negative.    PHYSICAL EXAMINATION Blood pressure 133/70, pulse 70, temperature 97.6 F (36.4 C), temperature source Oral, resp. rate 18, height '5\' 4"'  (1.626 m), weight 147 lb (66.7 kg), SpO2 100 %.  ECOG PERFORMANCE STATUS: 2 - Symptomatic, <50% confined to bed  Physical Exam  Constitutional:  Elderly frail male in no acute distress  HENT:  Head: Normocephalic and atraumatic.  Mouth/Throat: Oropharynx is clear and moist. No oropharyngeal exudate.  Eyes: Pupils are equal, round, and reactive to light. EOM are normal. No scleral icterus.  Neck: No thyromegaly present.  Cardiovascular: Normal rate and regular rhythm.  No murmur heard. Pulmonary/Chest: Breath sounds normal. No respiratory distress. He has no wheezes.  Abdominal: Soft. He exhibits no distension.  No hepatosplenomegaly  Musculoskeletal: He exhibits no edema.  Lymphadenopathy:    He has no cervical adenopathy.  Neurological: He is alert. He has normal reflexes. No cranial nerve deficit.  Skin: Skin is warm. No rash noted. No pallor.      LABORATORY DATA: I have personally reviewed the data as listed: Appointment on 05/04/2018  Component Date Value Ref Range Status  . Ferritin 05/04/2018 347* 22 - 316 ng/mL Final   Performed at [facility], [place] [address], [place], [place] [address]  . Iron 05/04/2018 27* 42 - 163 ug/dL Final  . TIBC 05/04/2018 285  202 - 409 ug/dL Final  . Saturation Ratios 05/04/2018 10* 42 - 163 % Final  . UIBC 05/04/2018 258  ug/dL Final   Performed at [facility], [place] [address] [place], [place] [address]  . LDH 05/04/2018 2,022* 125 - 245 U/L Final   Performed at [facility], [place] [address] [place], [place] [address]  . Sodium 05/04/2018 133* 136 - 145 mmol/L Final  . Potassium 05/04/2018 4.9  3.5 - 5.1 mmol/L Final  . Chloride 05/04/2018 102  98 - 109  mmol/L Final  . CO2 05/04/2018 22  22 - 29 mmol/L Final  . Glucose, Bld  05/04/2018 265* 70 - 140 mg/dL Final  . BUN 05/04/2018 15  7 - 26 mg/dL Final  . Creatinine 05/04/2018 1.53* 0.70 - 1.30 mg/dL Final  . Calcium 05/04/2018 9.1  8.4 - 10.4 mg/dL Final  . Total Protein 05/04/2018 7.3  6.4 - 8.3 g/dL Final  . Albumin 05/04/2018 3.4* 3.5 - 5.0 g/dL Final  . AST 05/04/2018 55* 5 - 34 U/L Final  . ALT 05/04/2018 20  0 - 55 U/L Final  . Alkaline Phosphatase 05/04/2018 130  40 - 150 U/L Final  . Total Bilirubin 05/04/2018 1.1  0.2 - 1.2 mg/dL Final  . GFR, Est Non Af Am 05/04/2018 40* >60 mL/min Final  . GFR, Est AFR Am 05/04/2018 46* >60 mL/min Final   Comment: (NOTE) The eGFR has been calculated using the CKD EPI equation. This calculation has not been validated in all clinical situations. eGFR's persistently <60 mL/min signify possible Chronic Kidney Disease.   Georgiann Hahn gap 05/04/2018 9  3 - 11 Final   Performed at [facility], [place] [address], [place], [place] [address]  . WBC Count 05/04/2018 13.7* 4.0 - 10.3 K/uL Final  . RBC 05/04/2018 3.72* 4.20 - 5.82 MIL/uL Final  . Hemoglobin 05/04/2018 8.9* 13.0 - 17.1 g/dL Final  . HCT 05/04/2018 29.7* 38.4 - 49.9 % Final  . MCV 05/04/2018 79.8  79.3 - 98.0 fL Final  . MCH 05/04/2018 23.9* 27.2 - 33.4 pg Final  . MCHC 05/04/2018 30.0* 32.0 - 36.0 g/dL Final  . RDW 05/04/2018 24.2* 11.0 - 14.6 % Final  . Platelet Count 05/04/2018 82* 140 - 400 K/uL Final  . Neutrophils Relative % 05/04/2018 33  % Final  . Lymphocytes Relative 05/04/2018 37  % Final  . Monocytes Relative 05/04/2018 15  % Final  . Eosinophils Relative 05/04/2018 0  % Final  . Basophils Relative 05/04/2018 0  % Final  . Band Neutrophils 05/04/2018 0  % Final  . Metamyelocytes Relative 05/04/2018 3  % Final  . Myelocytes 05/04/2018 12  % Final  . Promyelocytes Relative 05/04/2018 0  % Final  . Blasts 05/04/2018 0  % Final  . nRBC 05/04/2018 66* 0  /100 WBC Final  . Other 05/04/2018 0  % Final  . Neutro Abs 05/04/2018 6.5  1.5 - 6.5 K/uL Final  . Lymphs Abs 05/04/2018 5.1* 0.9 - 3.3 K/uL Final  . Monocytes Absolute 05/04/2018 2.1* 0.1 - 0.9 K/uL Final  . Eosinophils Absolute 05/04/2018 0.0  0.0 - 0.5 K/uL Final  . Basophils Absolute 05/04/2018 0.0  0.0 - 0.1 K/uL Final   Performed at [facility], [place] [address], [place], [place] [address]       Ardath Sax, MD

## 2018-05-21 NOTE — Assessment & Plan Note (Signed)
84 y.o. male with diagnosis of primary myelofibrosis with associated neutropenia without recurrent infections, anemia not requiring transfusions and thrombocytosis. Patient has been having hematological abnormalities from extended period of time, the earliest noted 2005. Patient underwent at least 2 bone marrow biopsy evaluations, most recent on 01/09/17 documenting the recurrent condition. IPSS score 3 -- high risk, mainly due to advanced age of the patient, anemia, and some systemic symptoms attribution of which is a little bit challenging. Patient's somnolence is not clearly related to myelofibrosis. He does not appear to have any problems with splenomegaly based on physical exam or symptoms and his appetite is excellent.   Patient reported overall improvement in his symptoms since initiation of ruxolitinib.  Hematological profile showed progressive worsening of the white blood cell count demonstrating elevated neutrophils, and significant left shift with presence of immature forms with up to 2% blasts.  Since the last visit to the clinic, patient sustained a fall precipitated by lightheadedness and dizziness.  Fall resulted in nasal fracture as well as a fracture of C1 for which patient was supposed to wear collar which he was not compliant with.  Today, I have reemphasized compliance with the collar until a orthopedic doctor or neurosurgeon tells the patient otherwise.  Additionally, on discharge from the hospital, ruxolitinib was discontinued by admitting physician.  This was done without consulting with hematology as far as I can tell.  Of note, ruxolitinib should never be discontinued abruptly and should be tapered as abrupt discontinuation can result in rapid recurrence and exacerbation of symptoms of myelofibrosis.  This is pretty evident with our patient at this point in time.  Plan: - Previously I have advised infectious prophylaxis with acyclovir, fluconazole, and Bactrim, but patient is hesitant  to increase the medication burden at this point in time. --Resume ruxolitinib 54m PO BID - Start ferrous sulfate supplementation as the current level of ferritin in the context of myelodysplastic syndrome is likely consistent with iron deficiency --Return to clinic in 2 months: Labs 2 to 3 days prior, clinic visit for hematological monitoring and ruxolitinib toxicity

## 2018-05-22 DIAGNOSIS — E1165 Type 2 diabetes mellitus with hyperglycemia: Secondary | ICD-10-CM | POA: Diagnosis not present

## 2018-05-22 DIAGNOSIS — I10 Essential (primary) hypertension: Secondary | ICD-10-CM | POA: Diagnosis not present

## 2018-05-22 DIAGNOSIS — S022XXD Fracture of nasal bones, subsequent encounter for fracture with routine healing: Secondary | ICD-10-CM | POA: Diagnosis not present

## 2018-05-22 DIAGNOSIS — N390 Urinary tract infection, site not specified: Secondary | ICD-10-CM | POA: Diagnosis not present

## 2018-05-22 DIAGNOSIS — D7581 Myelofibrosis: Secondary | ICD-10-CM | POA: Diagnosis not present

## 2018-05-22 DIAGNOSIS — S12000D Unspecified displaced fracture of first cervical vertebra, subsequent encounter for fracture with routine healing: Secondary | ICD-10-CM | POA: Diagnosis not present

## 2018-05-26 DIAGNOSIS — S022XXD Fracture of nasal bones, subsequent encounter for fracture with routine healing: Secondary | ICD-10-CM | POA: Diagnosis not present

## 2018-05-26 DIAGNOSIS — I10 Essential (primary) hypertension: Secondary | ICD-10-CM | POA: Diagnosis not present

## 2018-05-26 DIAGNOSIS — N390 Urinary tract infection, site not specified: Secondary | ICD-10-CM | POA: Diagnosis not present

## 2018-05-26 DIAGNOSIS — S12000D Unspecified displaced fracture of first cervical vertebra, subsequent encounter for fracture with routine healing: Secondary | ICD-10-CM | POA: Diagnosis not present

## 2018-05-26 DIAGNOSIS — D7581 Myelofibrosis: Secondary | ICD-10-CM | POA: Diagnosis not present

## 2018-05-26 DIAGNOSIS — E1165 Type 2 diabetes mellitus with hyperglycemia: Secondary | ICD-10-CM | POA: Diagnosis not present

## 2018-05-27 DIAGNOSIS — S12000D Unspecified displaced fracture of first cervical vertebra, subsequent encounter for fracture with routine healing: Secondary | ICD-10-CM | POA: Diagnosis not present

## 2018-05-27 DIAGNOSIS — E1165 Type 2 diabetes mellitus with hyperglycemia: Secondary | ICD-10-CM | POA: Diagnosis not present

## 2018-05-27 DIAGNOSIS — N390 Urinary tract infection, site not specified: Secondary | ICD-10-CM | POA: Diagnosis not present

## 2018-05-27 DIAGNOSIS — I10 Essential (primary) hypertension: Secondary | ICD-10-CM | POA: Diagnosis not present

## 2018-05-27 DIAGNOSIS — D7581 Myelofibrosis: Secondary | ICD-10-CM | POA: Diagnosis not present

## 2018-05-27 DIAGNOSIS — S022XXD Fracture of nasal bones, subsequent encounter for fracture with routine healing: Secondary | ICD-10-CM | POA: Diagnosis not present

## 2018-05-29 ENCOUNTER — Encounter: Payer: Self-pay | Admitting: Cardiology

## 2018-06-02 DIAGNOSIS — E1165 Type 2 diabetes mellitus with hyperglycemia: Secondary | ICD-10-CM | POA: Diagnosis not present

## 2018-06-02 DIAGNOSIS — I10 Essential (primary) hypertension: Secondary | ICD-10-CM | POA: Diagnosis not present

## 2018-06-02 DIAGNOSIS — N390 Urinary tract infection, site not specified: Secondary | ICD-10-CM | POA: Diagnosis not present

## 2018-06-02 DIAGNOSIS — D7581 Myelofibrosis: Secondary | ICD-10-CM | POA: Diagnosis not present

## 2018-06-02 DIAGNOSIS — S022XXD Fracture of nasal bones, subsequent encounter for fracture with routine healing: Secondary | ICD-10-CM | POA: Diagnosis not present

## 2018-06-02 DIAGNOSIS — S12000D Unspecified displaced fracture of first cervical vertebra, subsequent encounter for fracture with routine healing: Secondary | ICD-10-CM | POA: Diagnosis not present

## 2018-06-04 DIAGNOSIS — E1165 Type 2 diabetes mellitus with hyperglycemia: Secondary | ICD-10-CM | POA: Diagnosis not present

## 2018-06-04 DIAGNOSIS — S12000D Unspecified displaced fracture of first cervical vertebra, subsequent encounter for fracture with routine healing: Secondary | ICD-10-CM | POA: Diagnosis not present

## 2018-06-04 DIAGNOSIS — I10 Essential (primary) hypertension: Secondary | ICD-10-CM | POA: Diagnosis not present

## 2018-06-04 DIAGNOSIS — N390 Urinary tract infection, site not specified: Secondary | ICD-10-CM | POA: Diagnosis not present

## 2018-06-04 DIAGNOSIS — D7581 Myelofibrosis: Secondary | ICD-10-CM | POA: Diagnosis not present

## 2018-06-04 DIAGNOSIS — S022XXD Fracture of nasal bones, subsequent encounter for fracture with routine healing: Secondary | ICD-10-CM | POA: Diagnosis not present

## 2018-06-10 DIAGNOSIS — N390 Urinary tract infection, site not specified: Secondary | ICD-10-CM | POA: Diagnosis not present

## 2018-06-10 DIAGNOSIS — E1165 Type 2 diabetes mellitus with hyperglycemia: Secondary | ICD-10-CM | POA: Diagnosis not present

## 2018-06-10 DIAGNOSIS — D7581 Myelofibrosis: Secondary | ICD-10-CM | POA: Diagnosis not present

## 2018-06-10 DIAGNOSIS — I10 Essential (primary) hypertension: Secondary | ICD-10-CM | POA: Diagnosis not present

## 2018-06-10 DIAGNOSIS — S12000D Unspecified displaced fracture of first cervical vertebra, subsequent encounter for fracture with routine healing: Secondary | ICD-10-CM | POA: Diagnosis not present

## 2018-06-10 DIAGNOSIS — S022XXD Fracture of nasal bones, subsequent encounter for fracture with routine healing: Secondary | ICD-10-CM | POA: Diagnosis not present

## 2018-06-17 DIAGNOSIS — N390 Urinary tract infection, site not specified: Secondary | ICD-10-CM | POA: Diagnosis not present

## 2018-06-17 DIAGNOSIS — S022XXD Fracture of nasal bones, subsequent encounter for fracture with routine healing: Secondary | ICD-10-CM | POA: Diagnosis not present

## 2018-06-17 DIAGNOSIS — E1165 Type 2 diabetes mellitus with hyperglycemia: Secondary | ICD-10-CM | POA: Diagnosis not present

## 2018-06-17 DIAGNOSIS — S12000D Unspecified displaced fracture of first cervical vertebra, subsequent encounter for fracture with routine healing: Secondary | ICD-10-CM | POA: Diagnosis not present

## 2018-06-17 DIAGNOSIS — D7581 Myelofibrosis: Secondary | ICD-10-CM | POA: Diagnosis not present

## 2018-06-17 DIAGNOSIS — I10 Essential (primary) hypertension: Secondary | ICD-10-CM | POA: Diagnosis not present

## 2018-06-30 ENCOUNTER — Inpatient Hospital Stay: Payer: Medicare Other

## 2018-07-02 ENCOUNTER — Ambulatory Visit: Payer: Medicare Other | Admitting: Hematology

## 2018-07-04 DIAGNOSIS — E2681 Bartter's syndrome: Secondary | ICD-10-CM | POA: Diagnosis not present

## 2018-07-04 DIAGNOSIS — Z9181 History of falling: Secondary | ICD-10-CM | POA: Diagnosis not present

## 2018-07-20 DIAGNOSIS — Z9181 History of falling: Secondary | ICD-10-CM | POA: Diagnosis not present

## 2018-07-20 DIAGNOSIS — E2681 Bartter's syndrome: Secondary | ICD-10-CM | POA: Diagnosis not present

## 2018-07-24 ENCOUNTER — Other Ambulatory Visit: Payer: Self-pay | Admitting: Hematology and Oncology

## 2018-08-02 ENCOUNTER — Other Ambulatory Visit: Payer: Self-pay | Admitting: Family Medicine

## 2018-08-04 DIAGNOSIS — E2681 Bartter's syndrome: Secondary | ICD-10-CM | POA: Diagnosis not present

## 2018-08-04 DIAGNOSIS — Z9181 History of falling: Secondary | ICD-10-CM | POA: Diagnosis not present

## 2018-08-14 NOTE — Progress Notes (Signed)
HEMATOLOGY/ONCOLOGY CONSULTATION NOTE  Date of Service: 08/17/2018  Patient Care Team: Charlott Rakes, MD as PCP - General (Family Medicine) Jackelyn Knife, MD as Rounding Team (Internal Medicine)  CHIEF COMPLAINTS/PURPOSE OF CONSULTATION:  Myelofibrosis  Oncologic History:   The pt had a 01/09/17 BM Bx which revealed increased fibrosis with decreased erythrocytes, with microcytosis, hypochromasia, shistocytes, nucleated red blood cells, and BM cellularity at 80-85%. The pt began Lenalidomide 02/05/17 and medication was held single day of administration due to hospitalization with syncope. The pt began Ruxolitnib 33m PO BID on 11/19/17 and has continued this.   HISTORY OF PRESENTING ILLNESS:   Aaron Nelson a wonderful 84y.o. male who has been previously followed by my colleague Dr. MGrace Isaacfor evaluation and management of Myelfibrosis. He is accompanied today by his daughter-in-law. The pt reports that he is doing well overall.   The pt reports that his neck has not had any new pain from his fall in late April. He has not been re-evaluated for his C1 fracture and stopped wearing his neck collar without consulting a physician.   The pt notes that he has not developed any skin rashes or itching and has remained compliant with taking 117mJakafi BID. The pt notes that his energy levels have been stable.   The pt notes that he had some dizziness prior to his fall in April which occasionally presents still. The pt notes that he describes this as feeling light headed. He notes that his blood sugars have been well controlled and he has been drinking plenty of water. His BP today is at 88/63 and is taking Amlodipne and Namenda.   Most recent lab results (08/17/18) of CBC w/diff is as follows: all values are WNL except for WBC at 18.1k, RBC at 4.06, HGB at 9.9, HCT at 31.7, MCV at 78.1, MCH at 24.3, MCHC at 31.1, ANC at 11.8k, Lymphs abs at 3.8k, Monocytes abs at 1.5k, Basophils  abs at 0.7k, PLT are pending. Ferritin, LDH, CMP 08/17/18 are pending  On review of systems, pt reports stable energy levels, occasional light headedness, and denies recent falls, abdominal pains, leg swelling, skin rashes, itching, concerns for infection, neck pains, and any other symptoms.   MEDICAL HISTORY:  Past Medical History:  Diagnosis Date  . Diabetes mellitus without complication ([facility]  . Hypertension   . Myelodysplasia ([place]plastic syndrome) ([facility]  . S/P placement of cardiac pacemaker 08/30/2017  . Sinus arrest   . Syncope   . Syncope and collapse 08/30/2017    SURGICAL HISTORY: Past Surgical History:  Procedure Laterality Date  . BONE MARROW BIOPSY  2017  . LOOP RECORDER REMOVAL N/A 08/29/2017   Procedure: LOOP RECORDER REMOVAL;  Surgeon: KlDeboraha SprangMD;  [facility];  Service: Cardiovascular;  Laterality: N/A;  . PACEMAKER IMPLANT Left 08/29/2017   St Jude generator  . PACEMAKER IMPLANT N/A 08/29/2017   Procedure: Pacemaker Implant;  Surgeon: KlDeboraha SprangMD;  [facility];  Service: Cardiovascular;  Laterality: N/A;  . PORTACATH PLACEMENT  2017    SOCIAL HISTORY: Social History   Socioeconomic History  . Marital status: Divorced    Spouse name: Not on file  . Number of children: Not on file  . Years of education: Not on file  . Highest education level: Not on file  Occupational History  . Not on file  Social Needs  . Financial resource strain: Not on file  .  Food insecurity:    Worry: Not on file    Inability: Not on file  . Transportation needs:    Medical: Not on file    Non-medical: Not on file  Tobacco Use  . Smoking status: Never Smoker  . Smokeless tobacco: Never Used  Substance and Sexual Activity  . Alcohol use: No  . Drug use: No  . Sexual activity: Not Currently  Lifestyle  . Physical activity:    Days per week: Not on file    Minutes per session: Not on file  . Stress: Not on file    Relationships  . Social connections:    Talks on phone: Not on file    Gets together: Not on file    Attends religious service: Not on file    Active member of club or organization: Not on file    Attends meetings of clubs or organizations: Not on file    Relationship status: Not on file  . Intimate partner violence:    Fear of current or ex partner: Not on file    Emotionally abused: Not on file    Physically abused: Not on file    Forced sexual activity: Not on file  Other Topics Concern  . Not on file  Social History Narrative  . Not on file    FAMILY HISTORY: Family History  Problem Relation Age of Onset  . Hypertension Mother     ALLERGIES:  has No Known Allergies.  MEDICATIONS:  Current Outpatient Medications  Medication Sig Dispense Refill  . ACCU-CHEK FASTCLIX LANCETS MISC Use as directed 3 times daily. E11.9 100 each 12  . acetaminophen (TYLENOL) 325 MG tablet Take 2 tablets (650 mg total) by mouth every 4 (four) hours as needed for mild pain.    Marland Kitchen amLODipine (NORVASC) 10 MG tablet TAKE 1 TABLET BY MOUTH DAILY 90 tablet 3  . aspirin EC 81 MG tablet Take 81 mg by mouth daily.    . Blood Glucose Monitoring Suppl (ACCU-CHEK AVIVA PLUS) w/Device KIT Use as directed 3 times daily. E11.9 1 kit 0  . ferrous sulfate 325 (65 FE) MG EC tablet Take 1 tablet (325 mg total) by mouth 3 (three) times daily with meals. 90 tablet 2  . glucose blood (ACCU-CHEK AVIVA PLUS) test strip Use as directed 3 times daily. E11.9 100 each 12  . Insulin Glargine (LANTUS SOLOSTAR) 100 UNIT/ML Solostar Pen Inject 40 Units into the skin every morning. 15 mL 3  . Insulin Pen Needle 31G X 5 MM MISC Use as directed 100 each 3  . insulin regular (HUMULIN R) 100 units/mL injection Inject 0.1 mLs (10 Units total) into the skin 3 (three) times daily before meals. For Blood Sugar greater than 250 (Patient not taking: Reported on 08/17/2018) 10 mL 11  . memantine (NAMENDA) 10 MG tablet Take 1 tablet (10 mg  total) by mouth 2 (two) times daily. 60 tablet 3  . NITROSTAT 0.4 MG SL tablet Place 1 tablet (0.4 mg total) under the tongue every 5 (five) minutes as needed for chest pain. 25 tablet 3  . ruxolitinib phosphate (JAKAFI) 10 MG tablet Take 1 tablet (10 mg total) by mouth 2 (two) times daily. 60 tablet 2  . tamsulosin (FLOMAX) 0.4 MG CAPS capsule Take 1 capsule (0.4 mg total) by mouth daily. 30 capsule 3   No current facility-administered medications for this visit.     REVIEW OF SYSTEMS:    10 Point review of Systems was done  is negative except as noted above.  PHYSICAL EXAMINATION: ECOG PERFORMANCE STATUS: 3 - Symptomatic, >50% confined to bed  . Vitals:   08/17/18 1229  BP: (!) 88/63  Pulse: 74  Resp: 18  Temp: 97.9 F (36.6 C)  SpO2: 100%   Filed Weights   08/17/18 1229  Weight: 160 lb 1.6 oz (72.6 kg)   .Body mass index is 27.48 kg/m.  GENERAL:alert, in no acute distress and comfortable SKIN: no acute rashes, no significant lesions EYES: conjunctiva are pink and non-injected, sclera anicteric OROPHARYNX: MMM, no exudates, no oropharyngeal erythema or ulceration NECK: supple, no JVD LYMPH:  no palpable lymphadenopathy in the cervical, axillary or inguinal regions LUNGS: clear to auscultation b/l with normal respiratory effort HEART: regular rate & rhythm ABDOMEN:  normoactive bowel sounds , non tender, not distended. Extremity: no pedal edema PSYCH: alert & oriented x 3 with fluent speech NEURO: no focal motor/sensory deficits  LABORATORY DATA:  I have reviewed the data as listed  . CBC Latest Ref Rng & Units 08/17/2018 05/04/2018 04/23/2018  WBC 4.0 - 10.3 K/uL 18.1(H) 13.7(H) 11.4(H)  Hemoglobin 13.0 - 17.1 g/dL 9.9(L) 8.9(L) 8.6(L)  Hematocrit 38.4 - 49.9 % 31.7(L) 29.7(L) 27.9(L)  Platelets 140 - 400 K/uL 52(L) 82(L) 88(L)    . CMP Latest Ref Rng & Units 08/17/2018 05/04/2018 04/23/2018  Glucose 70 - 99 mg/dL 68(L) 265(H) 198(H)  BUN 8 - 23 mg/dL 28(H) 15  22(H)  Creatinine 0.61 - 1.24 mg/dL 1.83(H) 1.53(H) 1.31(H)  Sodium 135 - 145 mmol/L 139 133(L) 135  Potassium 3.5 - 5.1 mmol/L 4.6 4.9 4.5  Chloride 98 - 111 mmol/L 106 102 105  CO2 22 - 32 mmol/L '26 22 22  ' Calcium 8.9 - 10.3 mg/dL 9.8 9.1 8.8(L)  Total Protein 6.5 - 8.1 g/dL 7.8 7.3 -  Total Bilirubin 0.3 - 1.2 mg/dL 1.0 1.1 -  Alkaline Phos 38 - 126 U/L 125 130 -  AST 15 - 41 U/L 52(H) 55(H) -  ALT 0 - 44 U/L 25 20 -   01/09/17 BM Bx:   02/04/14 BM Bx:     RADIOGRAPHIC STUDIES: I have personally reviewed the radiological images as listed and agreed with the findings in the report. No results found.  ASSESSMENT & PLAN:  84 y.o. male with  1. Jak2 mutation positive Myelofibrosis . Lab Results  Component Value Date   LDH 2,112 (H) 08/17/2018    2. Iron deficiency anemia . Lab Results  Component Value Date   IRON 71 08/17/2018   TIBC 303 08/17/2018   IRONPCTSAT 24 (L) 08/17/2018   (Iron and TIBC)  Lab Results  Component Value Date   FERRITIN 432 (H) 08/17/2018    PLAN: -Discussed patient's most recent labs from 08/17/18, leukocytosis, anemia, thrombocytopenia  -continue current dose of Jakafi 65m po BID at this time. -Stressed the importance of the pt wearing his neck brace again, after his C1 fracture, and look to be evaluated by a neurosurgeon- will defer this mx to PCP. -Recommended that pt discuss his BP medication with his PCP, may need to cut back with dizziness and low BP in clinic today -Will see pt back in 328month, sooner if any new concerns    Will see pt back in 3 months  All of the patients questions were answered with apparent satisfaction. The patient knows to call the clinic with any problems, questions or concerns.  The total time spent in the appt was 25 minutes and more  than 50% was on counseling and direct patient cares.     Sullivan Lone MD MS AAHIVMS [facility] Hematology/Oncology Physician [facility]  (Office):        [phone] (Work cell):  [phone] (Fax):           [phone]  08/17/2018 1:00 PM  I, Baldwin [place], am acting as a scribe for Dr. Irene Limbo  .I have reviewed the above documentation for accuracy and completeness, and I agree with the above. Brunetta Genera MD

## 2018-08-17 ENCOUNTER — Inpatient Hospital Stay (HOSPITAL_BASED_OUTPATIENT_CLINIC_OR_DEPARTMENT_OTHER): Payer: Medicare Other | Admitting: Hematology

## 2018-08-17 ENCOUNTER — Encounter: Payer: Self-pay | Admitting: Family Medicine

## 2018-08-17 ENCOUNTER — Inpatient Hospital Stay: Payer: Medicare Other | Attending: Hematology and Oncology

## 2018-08-17 ENCOUNTER — Ambulatory Visit: Payer: Medicare Other | Attending: Family Medicine | Admitting: Family Medicine

## 2018-08-17 VITALS — BP 102/64 | HR 81 | Temp 98.1°F | Ht 64.0 in | Wt 159.8 lb

## 2018-08-17 VITALS — BP 88/63 | HR 74 | Temp 97.9°F | Resp 18 | Ht 64.0 in | Wt 160.1 lb

## 2018-08-17 DIAGNOSIS — I10 Essential (primary) hypertension: Secondary | ICD-10-CM | POA: Insufficient documentation

## 2018-08-17 DIAGNOSIS — D7581 Myelofibrosis: Secondary | ICD-10-CM | POA: Diagnosis not present

## 2018-08-17 DIAGNOSIS — I495 Sick sinus syndrome: Secondary | ICD-10-CM | POA: Insufficient documentation

## 2018-08-17 DIAGNOSIS — D509 Iron deficiency anemia, unspecified: Secondary | ICD-10-CM

## 2018-08-17 DIAGNOSIS — N400 Enlarged prostate without lower urinary tract symptoms: Secondary | ICD-10-CM | POA: Diagnosis not present

## 2018-08-17 DIAGNOSIS — E1169 Type 2 diabetes mellitus with other specified complication: Secondary | ICD-10-CM

## 2018-08-17 DIAGNOSIS — H539 Unspecified visual disturbance: Secondary | ICD-10-CM

## 2018-08-17 DIAGNOSIS — Z7902 Long term (current) use of antithrombotics/antiplatelets: Secondary | ICD-10-CM | POA: Diagnosis not present

## 2018-08-17 DIAGNOSIS — N401 Enlarged prostate with lower urinary tract symptoms: Secondary | ICD-10-CM

## 2018-08-17 DIAGNOSIS — Z950 Presence of cardiac pacemaker: Secondary | ICD-10-CM | POA: Diagnosis not present

## 2018-08-17 DIAGNOSIS — K009 Disorder of tooth development, unspecified: Secondary | ICD-10-CM

## 2018-08-17 DIAGNOSIS — Z7982 Long term (current) use of aspirin: Secondary | ICD-10-CM | POA: Insufficient documentation

## 2018-08-17 DIAGNOSIS — D508 Other iron deficiency anemias: Secondary | ICD-10-CM

## 2018-08-17 DIAGNOSIS — R413 Other amnesia: Secondary | ICD-10-CM | POA: Diagnosis not present

## 2018-08-17 DIAGNOSIS — Z1589 Genetic susceptibility to other disease: Secondary | ICD-10-CM

## 2018-08-17 DIAGNOSIS — Z9181 History of falling: Secondary | ICD-10-CM | POA: Diagnosis not present

## 2018-08-17 DIAGNOSIS — R3911 Hesitancy of micturition: Secondary | ICD-10-CM

## 2018-08-17 DIAGNOSIS — H6123 Impacted cerumen, bilateral: Secondary | ICD-10-CM | POA: Diagnosis not present

## 2018-08-17 DIAGNOSIS — D469 Myelodysplastic syndrome, unspecified: Secondary | ICD-10-CM | POA: Insufficient documentation

## 2018-08-17 DIAGNOSIS — Z79899 Other long term (current) drug therapy: Secondary | ICD-10-CM | POA: Insufficient documentation

## 2018-08-17 DIAGNOSIS — Z794 Long term (current) use of insulin: Secondary | ICD-10-CM

## 2018-08-17 LAB — CBC WITH DIFFERENTIAL (CANCER CENTER ONLY)
Basophils Absolute: 0.7 10*3/uL — ABNORMAL HIGH (ref 0.0–0.1)
Basophils Relative: 4 %
EOS ABS: 0.3 10*3/uL (ref 0.0–0.5)
EOS PCT: 2 %
HCT: 31.7 % — ABNORMAL LOW (ref 38.4–49.9)
Hemoglobin: 9.9 g/dL — ABNORMAL LOW (ref 13.0–17.1)
LYMPHS ABS: 3.8 10*3/uL — AB (ref 0.9–3.3)
Lymphocytes Relative: 21 %
MCH: 24.3 pg — AB (ref 27.2–33.4)
MCHC: 31.1 g/dL — AB (ref 32.0–36.0)
MCV: 78.1 fL — AB (ref 79.3–98.0)
MONO ABS: 1.5 10*3/uL — AB (ref 0.1–0.9)
MONOS PCT: 8 %
Neutro Abs: 11.8 10*3/uL — ABNORMAL HIGH (ref 1.5–6.5)
Neutrophils Relative %: 65 %
PLATELETS: 52 10*3/uL — AB (ref 140–400)
RBC: 4.06 MIL/uL — ABNORMAL LOW (ref 4.20–5.82)
RDW: 24.6 % — ABNORMAL HIGH (ref 11.0–14.6)
WBC Count: 18.1 10*3/uL — ABNORMAL HIGH (ref 4.0–10.3)
nRBC: 44 /100 WBC — ABNORMAL HIGH

## 2018-08-17 LAB — POCT GLYCOSYLATED HEMOGLOBIN (HGB A1C): HEMOGLOBIN A1C: 7.5 % — AB (ref 4.0–5.6)

## 2018-08-17 LAB — CMP (CANCER CENTER ONLY)
ALBUMIN: 3.8 g/dL (ref 3.5–5.0)
ALT: 25 U/L (ref 0–44)
AST: 52 U/L — AB (ref 15–41)
Alkaline Phosphatase: 125 U/L (ref 38–126)
Anion gap: 7 (ref 5–15)
BUN: 28 mg/dL — AB (ref 8–23)
CHLORIDE: 106 mmol/L (ref 98–111)
CO2: 26 mmol/L (ref 22–32)
CREATININE: 1.83 mg/dL — AB (ref 0.61–1.24)
Calcium: 9.8 mg/dL (ref 8.9–10.3)
GFR, Est AFR Am: 37 mL/min — ABNORMAL LOW (ref >60)
GFR, Estimated: 32 mL/min — ABNORMAL LOW (ref >60)
Glucose, Bld: 68 mg/dL — ABNORMAL LOW (ref 70–99)
POTASSIUM: 4.6 mmol/L (ref 3.5–5.1)
SODIUM: 139 mmol/L (ref 135–145)
Total Bilirubin: 1 mg/dL (ref 0.3–1.2)
Total Protein: 7.8 g/dL (ref 6.5–8.1)

## 2018-08-17 LAB — IRON AND TIBC
Iron: 71 ug/dL (ref 42–163)
Saturation Ratios: 24 % — ABNORMAL LOW (ref 42–163)
TIBC: 303 ug/dL (ref 202–409)
UIBC: 231 ug/dL

## 2018-08-17 LAB — FERRITIN: Ferritin: 432 ng/mL — ABNORMAL HIGH (ref 24–336)

## 2018-08-17 LAB — LACTATE DEHYDROGENASE: LDH: 2112 U/L — AB (ref 98–192)

## 2018-08-17 LAB — GLUCOSE, POCT (MANUAL RESULT ENTRY): POC GLUCOSE: 135 mg/dL — AB (ref 70–99)

## 2018-08-17 MED ORDER — MEMANTINE HCL 10 MG PO TABS: 10.0000 mg | ORAL_TABLET | Freq: Two times a day (BID) | ORAL | 3 refills | Status: AC

## 2018-08-17 MED ORDER — INSULIN GLARGINE 100 UNIT/ML SOLOSTAR PEN: 40.0000 [IU] | PEN_INJECTOR | SUBCUTANEOUS | 3 refills | Status: AC

## 2018-08-17 MED ORDER — TAMSULOSIN HCL 0.4 MG PO CAPS: 0.4000 mg | ORAL_CAPSULE | Freq: Every day | ORAL | 3 refills | Status: AC

## 2018-08-17 MED ORDER — RUXOLITINIB PHOSPHATE 10 MG PO TABS: 10.0000 mg | ORAL_TABLET | Freq: Two times a day (BID) | ORAL | 2 refills | Status: DC

## 2018-08-17 NOTE — Progress Notes (Signed)
Patient has dizzy spells off and on.

## 2018-08-17 NOTE — Progress Notes (Signed)
Subjective:  Patient ID: Aaron Nelson, male    DOB: 10/05/1930  Age: 84 y.o. MRN: [id number]  CC:   HPI Aaron Nelson is an 84 year old male with a history of Hypertension, Type 2 Diabetes Mellitus (A1C 7.5), sinus node dysfunction (s/p pacemaker ) Myelofibrosis, BPH here for a follow-up visit. His A1c 7.5 which is down from 11.4 previously he denies hypoglycemia, numbness in extremities but endorses visual concerns and has not been to see an ophthalmologist in the last 3 years. He complains of needing to see a dentist to have the rest of his teeth extracted as he has lost multiple teeth.  His ears also feel stopped up. He complains of intermittent lightheadedness and has to change positions slowly.  His blood pressure is low today and he does not have his home blood pressure logs with him. Denies chest pains or shortness of breath. For his myelofibrosis he is currently on Ruxolitinib Phosphate and is closely followed by oncology. He denies additional concerns at this time.  Past Medical History:  Diagnosis Date  . Diabetes mellitus without complication ([place])   . Hypertension   . Myelodysplasia (myelodysplastic syndrome) ([place])   . S/P placement of cardiac pacemaker 08/30/2017  . Sinus arrest   . Syncope   . Syncope and collapse 08/30/2017    Past Surgical History:  Procedure Laterality Date  . BONE MARROW BIOPSY  2017  . LOOP RECORDER REMOVAL N/A 08/29/2017   Procedure: LOOP RECORDER REMOVAL;  Surgeon: Aaron Sprang, MD;  Location: [place] [facility];  Service: Cardiovascular;  Laterality: N/A;  . PACEMAKER IMPLANT Left 08/29/2017   St Jude generator  . PACEMAKER IMPLANT N/A 08/29/2017   Procedure: Pacemaker Implant;  Surgeon: Aaron Sprang, MD;  Location: [facility];  Service: Cardiovascular;  Laterality: N/A;  . PORTACATH PLACEMENT  2017    No Known Allergies   Outpatient Medications Prior to Visit  Medication Sig Dispense Refill  . ACCU-CHEK FASTCLIX LANCETS MISC Use  as directed 3 times daily. E11.9 100 each 12  . acetaminophen (TYLENOL) 325 MG tablet Take 2 tablets (650 mg total) by mouth every 4 (four) hours as needed for mild pain.    Marland Kitchen amLODipine (NORVASC) 10 MG tablet TAKE 1 TABLET BY MOUTH DAILY 90 tablet 3  . aspirin EC 81 MG tablet Take 81 mg by mouth daily.    . Blood Glucose Monitoring Suppl (ACCU-CHEK AVIVA PLUS) w/Device KIT Use as directed 3 times daily. E11.9 1 kit 0  . glucose blood (ACCU-CHEK AVIVA PLUS) test strip Use as directed 3 times daily. E11.9 100 each 12  . Insulin Pen Needle 31G X 5 MM MISC Use as directed 100 each 3  . NITROSTAT 0.4 MG SL tablet Place 1 tablet (0.4 mg total) under the tongue every 5 (five) minutes as needed for chest pain. 25 tablet 3  . ruxolitinib phosphate (JAKAFI) 10 MG tablet Take 1 tablet (10 mg total) by mouth 2 (two) times daily. 60 tablet 2  . Insulin Glargine (LANTUS SOLOSTAR) 100 UNIT/ML Solostar Pen Inject 40 Units into the skin every morning. 15 mL 3  . memantine (NAMENDA) 10 MG tablet TAKE 1 TABLET BY MOUTH TWICE DAILY 60 tablet 0  . tamsulosin (FLOMAX) 0.4 MG CAPS capsule Take 1 capsule (0.4 mg total) by mouth daily. 30 capsule 3  . ferrous sulfate 325 (65 FE) MG EC tablet Take 1 tablet (325 mg total) by mouth 3 (three) times daily with meals.  90 tablet 2  . insulin regular (HUMULIN R) 100 units/mL injection Inject 0.1 mLs (10 Units total) into the skin 3 (three) times daily before meals. For Blood Sugar greater than 250 (Patient not taking: Reported on 08/17/2018) 10 mL 11   No facility-administered medications prior to visit.     ROS Review of Systems  Constitutional: Negative for activity change and appetite change.  HENT: Positive for dental problem. Negative for sinus pressure and sore throat.   Eyes: Negative for visual disturbance.  Respiratory: Negative for cough, chest tightness and shortness of breath.   Cardiovascular: Negative for chest pain and leg swelling.  Gastrointestinal:  Negative for abdominal distention, abdominal pain, constipation and diarrhea.  Endocrine: Negative.   Genitourinary: Negative for dysuria.  Musculoskeletal: Negative for joint swelling and myalgias.  Skin: Negative for rash.  Allergic/Immunologic: Negative.   Neurological: Negative for weakness, light-headedness and numbness.  Psychiatric/Behavioral: Negative for dysphoric mood and suicidal ideas.    Objective:  BP 102/64   Pulse 81   Temp 98.1 F (36.7 C) (Oral)   Ht '5\' 4"'  (1.626 m)   Wt 159 lb 12.8 oz (72.5 kg)   SpO2 99%   BMI 27.43 kg/m   BP/Weight 08/17/2018 05/15/2018 4/69/6295  Systolic BP 284 132 440  Diastolic BP 64 77 70  Wt. (Lbs) 159.8 147 147  BMI 27.43 25.23 25.23      Physical Exam  Constitutional: He is oriented to person, place, and time. He appears well-developed and well-nourished.  HENT:  Loss of multiple teeth Cerumen obscuring bilateral tympanic membrane  Cardiovascular: Normal rate, normal heart sounds and intact distal pulses.  No murmur heard. Pulmonary/Chest: Effort normal and breath sounds normal. He has no wheezes. He has no rales. He exhibits no tenderness.  Abdominal: Soft. Bowel sounds are normal. He exhibits no distension and no mass. There is no tenderness.  Musculoskeletal: Normal range of motion.  Neurological: He is alert and oriented to person, place, and time.  Skin: Skin is warm and dry.      Lab Results  Component Value Date   HGBA1C 7.5 (A) 08/17/2018    Assessment & Plan:   1. Type 2 diabetes mellitus with other specified complication, with long-term current use of insulin (HCC) Improved significantly with A1c of 7.5 which is down from 11.4 previously No regimen change today  Foot exam performed, referred to ophthalmology - POCT glucose (manual entry) - POCT glycosylated hemoglobin (Hb A1C) - Insulin Glargine (LANTUS SOLOSTAR) 100 UNIT/ML Solostar Pen; Inject 40 Units into the skin every morning.  Dispense: 15 mL;  Refill: 3 - Ambulatory referral to Ophthalmology - Microalbumin/Creatinine Ratio, Urine  2. Benign prostatic hyperplasia with hesitancy Stable - tamsulosin (FLOMAX) 0.4 MG CAPS capsule; Take 1 capsule (0.4 mg total) by mouth daily.  Dispense: 30 capsule; Refill: 3  3. Cerumen debris on tympanic membrane of both ears Bilateral ear irrigation performed  4. Vision abnormalities - Ambulatory referral to Ophthalmology  5. Dental anomaly - Ambulatory referral to Dentistry  6. Sinus node dysfunction (HCC) Could explain lightheadedness Advised to call the clinic with his home blood pressure logs to evaluate for possible hypotension and if this is the case he will reduce his amlodipine from 10 mg to 5 mg  7. Myelofibrosis ([place]) Currently on Ruxolitinib Phosphate Followed by oncology  8. Memory loss - memantine (NAMENDA) 10 MG tablet; Take 1 tablet (10 mg total) by mouth 2 (two) times daily.  Dispense: 60 tablet; Refill: 3  Meds ordered this encounter  Medications  . Insulin Glargine (LANTUS SOLOSTAR) 100 UNIT/ML Solostar Pen    Sig: Inject 40 Units into the skin every morning.    Dispense:  15 mL    Refill:  3  . tamsulosin (FLOMAX) 0.4 MG CAPS capsule    Sig: Take 1 capsule (0.4 mg total) by mouth daily.    Dispense:  30 capsule    Refill:  3  . memantine (NAMENDA) 10 MG tablet    Sig: Take 1 tablet (10 mg total) by mouth 2 (two) times daily.    Dispense:  60 tablet    Refill:  3    Follow-up: Return in about 2 weeks (around 08/31/2018) for annual wellness visit with [facility].   Charlott Rakes MD

## 2018-08-18 ENCOUNTER — Telehealth: Payer: Self-pay

## 2018-08-18 LAB — MICROALBUMIN / CREATININE URINE RATIO
Creatinine, Urine: 105.5 mg/dL
MICROALBUM., U, RANDOM: 24.6 ug/mL
Microalb/Creat Ratio: 23.3 mg/g creat (ref 0.0–30.0)

## 2018-08-18 NOTE — Telephone Encounter (Signed)
Per 8/26 no los 

## 2018-08-20 DIAGNOSIS — Z9181 History of falling: Secondary | ICD-10-CM | POA: Diagnosis not present

## 2018-08-20 DIAGNOSIS — E2681 Bartter's syndrome: Secondary | ICD-10-CM | POA: Diagnosis not present

## 2018-08-21 DIAGNOSIS — E113393 Type 2 diabetes mellitus with moderate nonproliferative diabetic retinopathy without macular edema, bilateral: Secondary | ICD-10-CM | POA: Diagnosis not present

## 2018-08-21 DIAGNOSIS — H26492 Other secondary cataract, left eye: Secondary | ICD-10-CM | POA: Diagnosis not present

## 2018-08-21 DIAGNOSIS — H35033 Hypertensive retinopathy, bilateral: Secondary | ICD-10-CM | POA: Diagnosis not present

## 2018-08-31 ENCOUNTER — Ambulatory Visit: Payer: Medicare Other | Admitting: Pharmacist

## 2018-09-02 NOTE — Progress Notes (Signed)
[facility] Note  09/03/2018     CHIEF COMPLAINT Patient presents for Retina Evaluation and Diabetic Eye Exam   HISTORY OF PRESENT ILLNESS: Aaron Nelson is a 84 y.o. male who presents to the clinic today for:   HPI    Retina Evaluation    In both eyes.  This started 3 months ago.  Associated Symptoms Photophobia.  Negative for Flashes, Blind Spot, Floaters, Pain, Glare, Distortion, Redness, Trauma, Shoulder/Hip pain, Fatigue, Weight Loss, Jaw Claudication, Scalp Tenderness and Fever.  Context:  distance vision, mid-range vision and near vision.  Treatments tried include surgery.  Response to treatment was significant improvement.  I, the attending physician,  performed the HPI with the patient and updated documentation appropriately.          Diabetic Eye Exam    Vision fluctuates with blood sugars.  Associated Symptoms Negative for Flashes, Blind Spot, Photophobia, Scalp Tenderness, Fever, Floaters, Pain, Glare, Jaw Claudication, Weight Loss, Distortion, Redness, Trauma, Shoulder/Hip pain and Fatigue.  Diabetes characteristics include Type 2, on insulin and taking oral medications.  This started 5 years ago.  Blood sugar level is uncontrolled.  Last Blood Glucose 135.  I, the attending physician,  performed the HPI with the patient and updated documentation appropriately.          Comments    Referral of Shirleen Schirmer, [place], for retina eval. Patient states he has had light sensitivity for several months, lights bothers his eyes especially in the am and mostly the OD. Pt is DM2 x 5 yrs BS unstable per patient, Pt reports he has episodes of "falling out", BS 135 this am, A1C (unknown), Pt is on insulin and Ragolitinib Patient states he had cataract sx ou x 4 yrs ago with significant improvement. Denies gtt's/vit's       Last edited by Bernarda Caffey, MD on 09/03/2018  3:45 PM. (History)    Pt states he was seen by Gwenlyn Perking and states he was concerned about  diabetes; Pt states OU VA is stable; Pt endorses having cataract sx x 4 years ago in [place]; Pt endorses dx of DM, states CBG normally runs 90-125, states the highest it has gotten is 140 and lowest is 60s; Pt alos endorses dx of HTN, states BP "comes and goes";   Referring physician: Shirleen Schirmer, PA-C [phone] [place], [place] [address] [phone]  HISTORICAL INFORMATION:   Selected notes from the MEDICAL RECORD NUMBER Referred by Shirleen Schirmer, PA-C for concern of NPDR / YAG clearance LEE: 08.30.19 (A. Lundquist) [BCVA: OD: 20/40 OS: 20/30] Ocular Hx-pseudo OU PMH-DM (taking Lantus), HTN, pacemaker    CURRENT MEDICATIONS: No current outpatient medications on file. (Ophthalmic Drugs)   No current facility-administered medications for this visit.  (Ophthalmic Drugs)   Current Outpatient Medications (Other)  Medication Sig  . ACCU-CHEK FASTCLIX LANCETS MISC Use as directed 3 times daily. E11.9  . acetaminophen (TYLENOL) 325 MG tablet Take 2 tablets (650 mg total) by mouth every 4 (four) hours as needed for mild pain.  Marland Kitchen amLODipine (NORVASC) 10 MG tablet TAKE 1 TABLET BY MOUTH DAILY  . aspirin EC 81 MG tablet Take 81 mg by mouth daily.  . Blood Glucose Monitoring Suppl (ACCU-CHEK AVIVA PLUS) w/Device KIT Use as directed 3 times daily. E11.9  . glucose blood (ACCU-CHEK AVIVA PLUS) test strip Use as directed 3 times daily. E11.9  . Insulin Glargine (LANTUS SOLOSTAR) 100 UNIT/ML Solostar Pen Inject 40  Units into the skin every morning.  . Insulin Pen Needle 31G X 5 MM MISC Use as directed  . insulin regular (HUMULIN R) 100 units/mL injection Inject 0.1 mLs (10 Units total) into the skin 3 (three) times daily before meals. For Blood Sugar greater than 250  . memantine (NAMENDA) 10 MG tablet Take 1 tablet (10 mg total) by mouth 2 (two) times daily.  Marland Kitchen NITROSTAT 0.4 MG SL tablet Place 1 tablet (0.4 mg total) under the tongue every 5 (five) minutes as needed for chest pain.   . ruxolitinib phosphate (JAKAFI) 10 MG tablet Take 1 tablet (10 mg total) by mouth 2 (two) times daily.  . tamsulosin (FLOMAX) 0.4 MG CAPS capsule Take 1 capsule (0.4 mg total) by mouth daily.  . ferrous sulfate 325 (65 FE) MG EC tablet Take 1 tablet (325 mg total) by mouth 3 (three) times daily with meals.   No current facility-administered medications for this visit.  (Other)      REVIEW OF SYSTEMS: ROS    Positive for: Endocrine, Eyes   Negative for: Constitutional, Gastrointestinal, Neurological, Skin, Genitourinary, Musculoskeletal, HENT, Cardiovascular, Respiratory, Psychiatric, Allergic/Imm, Heme/Lymph   Last edited by Zenovia Jordan, LPN on 9/50/9326  7:12 AM. (History)       ALLERGIES No Known Allergies  PAST MEDICAL HISTORY Past Medical History:  Diagnosis Date  . Diabetes mellitus without complication ([place])   . Hypertension   . Myelodysplasia (myelodysplastic syndrome) ([place])   . S/P placement of cardiac pacemaker 08/30/2017  . Sinus arrest   . Syncope   . Syncope and collapse 08/30/2017   Past Surgical History:  Procedure Laterality Date  . BONE MARROW BIOPSY  2017  . CATARACT EXTRACTION    . EYE SURGERY    . LOOP RECORDER REMOVAL N/A 08/29/2017   Procedure: LOOP RECORDER REMOVAL;  Surgeon: Deboraha Sprang, MD;  Location: [facility];  Service: Cardiovascular;  Laterality: N/A;  . PACEMAKER IMPLANT Left 08/29/2017   St Jude generator  . PACEMAKER IMPLANT N/A 08/29/2017   Procedure: Pacemaker Implant;  Surgeon: Deboraha Sprang, MD;  Location: [facility];  Service: Cardiovascular;  Laterality: N/A;  . PORTACATH PLACEMENT  2017    FAMILY HISTORY Family History  Problem Relation Age of Onset  . Hypertension Mother     SOCIAL HISTORY Social History   Tobacco Use  . Smoking status: Never Smoker  . Smokeless tobacco: Never Used  Substance Use Topics  . Alcohol use: No  . Drug use: No         OPHTHALMIC EXAM:  Base Eye Exam    Visual  Acuity (Snellen - Linear)      Right Left   Dist cc 20/25 20/30   Dist ph cc NI NI       Tonometry (Tonopen, 9:14 AM)      Right Left   Pressure 16 17       Pupils      Dark Light Shape React APD   Right 4 3 Round Brisk None   Left 4 3 Round Brisk None       Visual Fields (Counting fingers)      Left Right    Full Full       Extraocular Movement      Right Left    Full, Ortho Full, Ortho       Neuro/Psych    Oriented x3:  Yes   Mood/Affect:  Normal  Dilation    Both eyes:  1.0% Mydriacyl, 2.5% Phenylephrine @ 9:14 AM        Slit Lamp and Fundus Exam    Slit Lamp Exam      Right Left   Lids/Lashes Dermatochalasis - upper lid, Meibomian gland dysfunction Dermatochalasis - upper lid   Conjunctiva/Sclera Nasal and Temporal Pinguecula, Melanosis Nasal and Temporal Pinguecula, Melanosis   Cornea Arcus, Temporal Well healed cataract wounds, 1+ Punctate epithelial erosions Arcus, Temporal Well healed cataract wounds, 1+ Punctate epithelial erosions   Anterior Chamber Deep and quiet Deep and quiet   Iris Round and dilated Round and dilated   Lens PC IOL in good position PC IOL in good position, 1+ Posterior capsular opacification   Vitreous Vitreous syneresis Vitreous syneresis, Posterior vitreous detachment       Fundus Exam      Right Left   Disc Compact, mild Temporal Peripapillary atrophy, flame hemorrhage at 1200 Sharp   C/D Ratio 0.0 0.1   Macula Blunted foveal reflex, Epiretinal membrane, scattered DBH and CWS Blunted foveal reflex, Epiretinal membrane, scattered DBH   Vessels Vascular attenuation Vascular attenuation, AV crossing changes, Tortuous   Periphery Attached, 360 DBH Attached, 360 DBH        Refraction    Manifest Refraction      Sphere Cylinder Dist VA   Right +1.00 Sphere 20/25+2   Left +0.75 Sphere 20/30+2          IMAGING AND PROCEDURES  Imaging and Procedures for '@TODAY' @  OCT, Retina - OU - Both Eyes       Right  Eye Quality was good. Central Foveal Thickness: 248. Progression has no prior data. Findings include normal foveal contour, no SRF, intraretinal fluid (Trace cystic changes, trace ERM).   Left Eye Quality was good. Central Foveal Thickness: 244. Progression has no prior data. Findings include normal foveal contour, no IRF, no SRF, epiretinal membrane.   Notes *Images captured and stored on drive  Diagnosis / Impression:  OD: NFP, no SRF, trace cystic changes OS: NFP, No IRF/SRF, ERM  Clinical management:  See below  Abbreviations: NFP - Normal foveal profile. CME - cystoid macular edema. PED - pigment epithelial detachment. IRF - intraretinal fluid. SRF - subretinal fluid. EZ - ellipsoid zone. ERM - epiretinal membrane. ORA - outer retinal atrophy. ORT - outer retinal tubulation. SRHM - subretinal hyper-reflective material         Fluorescein Angiography Optos (Transit OD)       Right Eye   Progression has no prior data. Early phase findings include microaneurysm, vascular perfusion defect. Mid/Late phase findings include microaneurysm, vascular perfusion defect, leakage.   Left Eye   Progression has no prior data. Early phase findings include microaneurysm, vascular perfusion defect. Mid/Late phase findings include leakage, microaneurysm, vascular perfusion defect.   Notes *Images captured and stored on drive;   Impression: Moderate NPDR with late leaking microaneurysms OU Significant patches of capillary non perfusion with peripheral perivascular leakage late                   ASSESSMENT/PLAN:    ICD-10-CM   1. Retinopathy of both eyes H35.00   2. Moderate nonproliferative diabetic retinopathy of both eyes without macular edema associated with type 2 diabetes mellitus ([place]) A12.8786   3. Retinal edema H35.81 OCT, Retina - OU - Both Eyes    Fluorescein Angiography Optos (Transit OD)  4. Epiretinal membrane (ERM) of both eyes H35.373   5. Hypertensive  retinopathy of both eyes H35.033   6. Essential hypertension I10   7. Vitreous syneresis of both eyes H43.393   8. Pseudophakia of both eyes Z96.1     1. Leukemic retinopathy OU - scattered peripheral blot hemorrhages 360 OU -- some with mild white centers ?roth spots - known history of myelofibrosis -- under the expert management of Dr. Irene Limbo, Medical Oncology - labs on 8.26.19 w/ leukocytosis (WBC 18.1),Anemia (Hgb 9.9) and thrombocytopenia (Plt 52) - suspect majority of blot hemorrhages related to abnormal blood counts, although retinopathy likely complicated by history of DM2 as below - no acute intervention indicated other than systemic therapy per Heme/Onc - will notify Heme/Onc team of findings - will monitor - f/u in 2-3 mos  2. Moderate non-proliferative diabetic retinopathy w/o DME, OU - The incidence, risk factors for progression, natural history and treatment options for diabetic retinopathy were discussed with patient.   - The need for close monitoring of blood glucose, blood pressure, and serum lipids, avoiding cigarette or any type of tobacco, and the need for long term follow up was also discussed with patient. - exam shows scattered dot and blot hemes OU; no obvious neovascularization - FA with late-leaking microaneurysms and peripheral nonperfusion OU - OCT without diabetic macular edema, both eyes  - f/u in 2-3 mos  3. No retinal edema on exam or OCT  4. Epiretinal membrane, OU The natural history, anatomy, potential for loss of vision, and treatment options including vitrectomy techniques and the complications of endophthalmitis, retinal detachment, vitreous hemorrhage, cataract progression and permanent vision loss discussed with the patient. - asymptomatic, mild ERM -- no metamorphopsia - no surgical intervention indicated at this time - monitor  5,6. Hypertensive retinopathy OU - discussed importance of tight BP control - monitor  7. PVD / vitreous syneresis  OU  Discussed findings and prognosis  No RT or RD on 360 peripheral exam  Reviewed s/s of RT/RD  Strict return precautions for any such RT/RD signs/symptoms  8. Pseudophakia OU  - s/p CE/IOL  - beautiful surgery, doing well  - monitor   Ophthalmic Meds Ordered this visit:  No orders of the defined types were placed in this encounter.      Return in about 3 months (around 12/03/2018) for F/U NPDR OU, DFE, OCT.  There are no Patient Instructions on file for this visit.   Explained the diagnoses, plan, and follow up with the patient and they expressed understanding.  Patient expressed understanding of the importance of proper follow up care.   This document serves as a record of services personally performed by Gardiner Sleeper, MD, PhD. It was created on their behalf by Ernest Mallick, OA, an ophthalmic assistant. The creation of this record is the provider's dictation and/or activities during the visit.    Electronically signed by: Ernest Mallick, OA  09.11.2019 3:46 PM   This document serves as a record of services personally performed by Gardiner Sleeper, MD, PhD. It was created on their behalf by Catha Brow, [place], a certified ophthalmic assistant. The creation of this record is the provider's dictation and/or activities during the visit.  Electronically signed by: Catha Brow, [place]  09.12.19 3:46 PM   Gardiner Sleeper, M.D., Ph.D. Diseases & Surgery of the Retina and Vitreous [facility]   I have reviewed the above documentation for accuracy and completeness, and I agree with the above. Gardiner Sleeper, M.D., Ph.D. 09/03/18 3:46 PM    Abbreviations:  M myopia (nearsighted); A astigmatism; H hyperopia (farsighted); P presbyopia; Mrx spectacle prescription;  CTL contact lenses; OD right eye; OS left eye; OU both eyes  XT exotropia; ET esotropia; PEK punctate epithelial keratitis; PEE punctate epithelial erosions; DES dry eye syndrome; MGD meibomian  gland dysfunction; ATs artificial tears; PFAT's preservative free artificial tears; [place] nuclear sclerotic cataract; PSC posterior subcapsular cataract; ERM epi-retinal membrane; PVD posterior vitreous detachment; RD retinal detachment; DM diabetes mellitus; DR diabetic retinopathy; NPDR non-proliferative diabetic retinopathy; PDR proliferative diabetic retinopathy; CSME clinically significant macular edema; DME diabetic macular edema; dbh dot blot hemorrhages; CWS cotton wool spot; POAG primary open angle glaucoma; C/D cup-to-disc ratio; HVF humphrey visual field; GVF goldmann visual field; OCT optical coherence tomography; IOP intraocular pressure; BRVO Branch retinal vein occlusion; CRVO central retinal vein occlusion; CRAO central retinal artery occlusion; BRAO branch retinal artery occlusion; RT retinal tear; SB scleral buckle; PPV pars plana vitrectomy; VH Vitreous hemorrhage; PRP panretinal laser photocoagulation; IVK intravitreal kenalog; VMT vitreomacular traction; MH Macular hole;  NVD neovascularization of the disc; NVE neovascularization elsewhere; AREDS age related eye disease study; ARMD age related macular degeneration; POAG primary open angle glaucoma; EBMD epithelial/anterior basement membrane dystrophy; ACIOL anterior chamber intraocular lens; IOL intraocular lens; PCIOL posterior chamber intraocular lens; Phaco/IOL phacoemulsification with intraocular lens placement; [place] photorefractive keratectomy; LASIK laser assisted in situ keratomileusis; HTN hypertension; DM diabetes mellitus; COPD chronic obstructive pulmonary disease

## 2018-09-03 ENCOUNTER — Ambulatory Visit (INDEPENDENT_AMBULATORY_CARE_PROVIDER_SITE_OTHER): Payer: Medicare Other | Admitting: Ophthalmology

## 2018-09-03 ENCOUNTER — Encounter (INDEPENDENT_AMBULATORY_CARE_PROVIDER_SITE_OTHER): Payer: Self-pay | Admitting: Ophthalmology

## 2018-09-03 DIAGNOSIS — H3500 Unspecified background retinopathy: Secondary | ICD-10-CM

## 2018-09-03 DIAGNOSIS — Z961 Presence of intraocular lens: Secondary | ICD-10-CM

## 2018-09-03 DIAGNOSIS — H43393 Other vitreous opacities, bilateral: Secondary | ICD-10-CM

## 2018-09-03 DIAGNOSIS — H35033 Hypertensive retinopathy, bilateral: Secondary | ICD-10-CM | POA: Diagnosis not present

## 2018-09-03 DIAGNOSIS — H3581 Retinal edema: Secondary | ICD-10-CM

## 2018-09-03 DIAGNOSIS — I10 Essential (primary) hypertension: Secondary | ICD-10-CM

## 2018-09-03 DIAGNOSIS — E113393 Type 2 diabetes mellitus with moderate nonproliferative diabetic retinopathy without macular edema, bilateral: Secondary | ICD-10-CM

## 2018-09-03 DIAGNOSIS — H35373 Puckering of macula, bilateral: Secondary | ICD-10-CM

## 2018-09-04 DIAGNOSIS — Z9181 History of falling: Secondary | ICD-10-CM | POA: Diagnosis not present

## 2018-09-04 DIAGNOSIS — E2681 Bartter's syndrome: Secondary | ICD-10-CM | POA: Diagnosis not present

## 2018-09-20 DIAGNOSIS — E2681 Bartter's syndrome: Secondary | ICD-10-CM | POA: Diagnosis not present

## 2018-09-20 DIAGNOSIS — Z9181 History of falling: Secondary | ICD-10-CM | POA: Diagnosis not present

## 2018-10-14 ENCOUNTER — Telehealth: Payer: Self-pay

## 2018-10-14 NOTE — Telephone Encounter (Signed)
Oral Oncology Patient Advocate Encounter  I received notification from Dr. Pollie Meyer nurse that a renewal Pa was needed for [facility] and provided the Key that [facility] sent. I attempted to do the PA in Cover my meds and it said that it was already on file. I spoke to [facility] and the PA had been extended, new approval dates are 05/21/18-05/21/19.   I called [facility] and gave them this information.  [place] [facility] [place] Phone [phone] Fax [phone]

## 2018-10-29 ENCOUNTER — Other Ambulatory Visit: Payer: Self-pay | Admitting: Hematology and Oncology

## 2018-10-29 ENCOUNTER — Encounter (INDEPENDENT_AMBULATORY_CARE_PROVIDER_SITE_OTHER): Payer: Medicare Other | Admitting: Ophthalmology

## 2018-11-06 ENCOUNTER — Other Ambulatory Visit: Payer: Self-pay | Admitting: *Deleted

## 2018-11-06 ENCOUNTER — Other Ambulatory Visit: Payer: Self-pay | Admitting: Hematology

## 2018-11-06 ENCOUNTER — Telehealth: Payer: Self-pay | Admitting: Hematology

## 2018-11-06 DIAGNOSIS — D7581 Myelofibrosis: Secondary | ICD-10-CM

## 2018-11-06 MED ORDER — FERROUS SULFATE 325 (65 FE) MG PO TBEC: 325.0000 mg | DELAYED_RELEASE_TABLET | Freq: Three times a day (TID) | ORAL | 2 refills | Status: AC

## 2018-11-06 NOTE — Telephone Encounter (Signed)
Scheduled appt per 11/15 sch message - pt son is aware of appt date and time

## 2018-12-02 ENCOUNTER — Emergency Department (HOSPITAL_COMMUNITY): Payer: Medicare HMO

## 2018-12-02 ENCOUNTER — Encounter (HOSPITAL_COMMUNITY): Payer: Self-pay | Admitting: Emergency Medicine

## 2018-12-02 ENCOUNTER — Emergency Department (HOSPITAL_COMMUNITY)
Admission: EM | Admit: 2018-12-02 | Discharge: 2018-12-02 | Disposition: A | Discharge status: Home / Self Care | Payer: Medicare HMO | Attending: Emergency Medicine | Admitting: Emergency Medicine

## 2018-12-02 ENCOUNTER — Other Ambulatory Visit: Payer: Self-pay

## 2018-12-02 DIAGNOSIS — I10 Essential (primary) hypertension: Secondary | ICD-10-CM | POA: Diagnosis not present

## 2018-12-02 DIAGNOSIS — Z79899 Other long term (current) drug therapy: Secondary | ICD-10-CM | POA: Diagnosis not present

## 2018-12-02 DIAGNOSIS — E119 Type 2 diabetes mellitus without complications: Secondary | ICD-10-CM | POA: Insufficient documentation

## 2018-12-02 DIAGNOSIS — Z7982 Long term (current) use of aspirin: Secondary | ICD-10-CM | POA: Insufficient documentation

## 2018-12-02 DIAGNOSIS — R0602 Shortness of breath: Secondary | ICD-10-CM | POA: Diagnosis not present

## 2018-12-02 DIAGNOSIS — R42 Dizziness and giddiness: Secondary | ICD-10-CM | POA: Diagnosis not present

## 2018-12-02 LAB — CBC WITH DIFFERENTIAL/PLATELET
BASOS PCT: 5 %
Band Neutrophils: 0 %
Basophils Absolute: 0.9 10*3/uL — ABNORMAL HIGH (ref 0.0–0.1)
Blasts: 16 %
Eosinophils Absolute: 0.8 10*3/uL — ABNORMAL HIGH (ref 0.0–0.5)
Eosinophils Relative: 4 %
HCT: 33.8 % — ABNORMAL LOW (ref 39.0–52.0)
Hemoglobin: 9.7 g/dL — ABNORMAL LOW (ref 13.0–17.0)
Lymphocytes Relative: 24 %
Lymphs Abs: 4.5 10*3/uL — ABNORMAL HIGH (ref 0.7–4.0)
MCH: 24.3 pg — ABNORMAL LOW (ref 26.0–34.0)
MCHC: 28.7 g/dL — ABNORMAL LOW (ref 30.0–36.0)
MCV: 84.7 fL (ref 80.0–100.0)
MONOS PCT: 15 %
Metamyelocytes Relative: 2 %
Monocytes Absolute: 2.8 10*3/uL — ABNORMAL HIGH (ref 0.1–1.0)
Myelocytes: 9 %
Neutro Abs: 6.8 10*3/uL (ref 1.7–7.7)
Neutrophils Relative %: 20 %
OTHER: 0 %
Platelets: 50 10*3/uL — ABNORMAL LOW (ref 150–400)
Promyelocytes Relative: 5 %
RBC: 3.99 MIL/uL — ABNORMAL LOW (ref 4.22–5.81)
RDW: 22.9 % — AB (ref 11.5–15.5)
WBC: 18.8 10*3/uL — ABNORMAL HIGH (ref 4.0–10.5)
nRBC: 50.5 % — ABNORMAL HIGH (ref 0.0–0.2)
nRBC: 98 /100 WBC — ABNORMAL HIGH

## 2018-12-02 LAB — BASIC METABOLIC PANEL
Anion gap: 11 (ref 5–15)
BUN: 19 mg/dL (ref 8–23)
CHLORIDE: 109 mmol/L (ref 98–111)
CO2: 21 mmol/L — ABNORMAL LOW (ref 22–32)
CREATININE: 1.72 mg/dL — AB (ref 0.61–1.24)
Calcium: 8.9 mg/dL (ref 8.9–10.3)
GFR calc Af Amer: 41 mL/min — ABNORMAL LOW (ref >60)
GFR calc non Af Amer: 35 mL/min — ABNORMAL LOW (ref >60)
Glucose, Bld: 132 mg/dL — ABNORMAL HIGH (ref 70–99)
Potassium: 5.3 mmol/L — ABNORMAL HIGH (ref 3.5–5.1)
SODIUM: 141 mmol/L (ref 135–145)

## 2018-12-02 LAB — BRAIN NATRIURETIC PEPTIDE: B Natriuretic Peptide: 70.1 pg/mL (ref 0.0–100.0)

## 2018-12-02 LAB — TROPONIN I: Troponin I: <0.03 ng/mL (ref <0.03)

## 2018-12-02 MED ORDER — IOPAMIDOL (ISOVUE-370) INJECTION 76%
75.0000 mL | Freq: Once | INTRAVENOUS | Status: AC | PRN
  Administered 2018-12-02: 55 mL via INTRAVENOUS

## 2018-12-02 MED ORDER — IOPAMIDOL (ISOVUE-370) INJECTION 76%
INTRAVENOUS | Status: AC
  Filled 2018-12-02: qty 100

## 2018-12-02 NOTE — ED Provider Notes (Signed)
Received care from Dr. Regenia Skeeter. Briefly, this is an 84yo male who presents with concern for dyspnea. PE study pending.    PE study without abnormalities. No signs of pneumothorax, PE, ACS, pneumonia, CHF. Anemia is at baseline, however may contribute to some dyspnea on exertion.  No chest pain and do not feel inpatient evaluation for anginal equivalent indicated. Recommend follow up with PCP, possible Cardiology and Pulmology evaluation as outpatient.  Reports lightheadedness when standing and some unsteady gait which has been present for months. He does not have focal findings on neuro exam, normal EOM, normal visual fields, normal finger to nose, and history and exam do not suggest acute CVA.  Recommend using walker and following up with PCP. Patient able to ambulate in ED with assistance with normal O2 saturation. Patient discharged in stable condition with understanding of reasons to return.    Gareth Morgan, MD 12/03/18 1109

## 2018-12-02 NOTE — ED Notes (Signed)
Pt 02 sat @ bedside 100%. During ambulation pt 02 sat @ 95%. Pt experienced dizziness while walking and unsteadiness. Pt ambulated with use of a cane. Pt states that he uses a cane for short walking distances and a walker for longer distances. Pt returned safely back to bedside with two staff assist.

## 2018-12-02 NOTE — ED Provider Notes (Signed)
[facility] EMERGENCY DEPARTMENT Provider Note   CSN: [id number] Arrival date & time: 12/02/18  1318     History   Chief Complaint Chief Complaint  Patient presents with  . Shortness of Breath    HPI Aaron Nelson is a 84 y.o. male.  HPI  84 year old male with a history of diabetes and a cardiac pacemaker presents with shortness of breath.  He states he has been feeling short of breath on and off for about 2 weeks.  However starting about 3 days ago he has noticed worsening dyspnea.  He states anytime he gets up to walk he can barely walk 10 feet without getting short of breath.  There is no cough, fever, chest pain or pressure.  No leg swelling, abdominal swelling or weight gain.  No recent change in his medicines.  At rest he is comfortable and without distress or shortness of breath.  Past Medical History:  Diagnosis Date  . Diabetes mellitus without complication ([place])   . Hypertension   . Myelodysplasia (myelodysplastic syndrome) ([place])   . S/P placement of cardiac pacemaker 08/30/2017  . Sinus arrest   . Syncope   . Syncope and collapse 08/30/2017    Patient Active Problem List   Diagnosis Date Noted  . Memory loss 08/17/2018  . Closed fracture of nasal bones   . Postural dizziness with presyncope   . Syncope 04/21/2018  . Benign prostatic hyperplasia with hesitancy 12/10/2017  . Hyperglycemia 12/10/2017  . Abnormal CT scan, kidney 10/15/2017  . Syncope and collapse 08/30/2017  . Sinus arrest 20 sec with syncope 08/30/2017  . S/P placement of cardiac pacemaker 08/30/2017  . Sinus node dysfunction ([place]) 08/29/2017  . Myelofibrosis ([place]) 07/24/2017  . JAK2 V617F mutation 07/24/2017  . Essential hypertension 07/15/2017  . Gait disturbance 07/15/2017  . Uncontrolled type 2 diabetes mellitus without complication, with long-term current use of insulin ([place]) 07/15/2017  . Leukopenia 07/03/2017  . Anemia 07/03/2017  . Thrombocytosis ([place]) 07/03/2017     Past Surgical History:  Procedure Laterality Date  . BONE MARROW BIOPSY  2017  . CATARACT EXTRACTION    . EYE SURGERY    . LOOP RECORDER REMOVAL N/A 08/29/2017   Procedure: LOOP RECORDER REMOVAL;  Surgeon: Deboraha Sprang, MD;  Location: [facility];  Service: Cardiovascular;  Laterality: N/A;  . PACEMAKER IMPLANT Left 08/29/2017   St Jude generator  . PACEMAKER IMPLANT N/A 08/29/2017   Procedure: Pacemaker Implant;  Surgeon: Deboraha Sprang, MD;  Location: [facility];  Service: Cardiovascular;  Laterality: N/A;  . PORTACATH PLACEMENT  2017        Home Medications    Prior to Admission medications   Medication Sig Start Date End Date Taking? Authorizing Provider  acetaminophen (TYLENOL) 325 MG tablet Take 2 tablets (650 mg total) by mouth every 4 (four) hours as needed for mild pain. 08/30/17  Yes Isaiah Serge, NP  amLODipine (NORVASC) 10 MG tablet TAKE 1 TABLET BY MOUTH DAILY Patient taking differently: Take 10 mg by mouth daily.  12/11/17  Yes Deboraha Sprang, MD  aspirin EC 81 MG tablet Take 81 mg by mouth daily.   Yes [provider]  ferrous sulfate 325 (65 FE) MG EC tablet Take 1 tablet (325 mg total) by mouth 3 (three) times daily with meals. 11/06/18 12/06/18 Yes Brunetta Genera, MD  Insulin Glargine (LANTUS SOLOSTAR) 100 UNIT/ML Solostar Pen Inject 40 Units into the skin every morning.  08/17/18  Yes Charlott Rakes, MD  insulin regular (HUMULIN R) 100 units/mL injection Inject 0.1 mLs (10 Units total) into the skin 3 (three) times daily before meals. For Blood Sugar greater than 250 12/10/17  Yes Jegede, Olugbemiga E, MD  isosorbide mononitrate (IMDUR) 30 MG 24 hr tablet Take 30 mg by mouth daily. 09/17/18  Yes [provider]  JAKAFI 10 MG tablet TAKE 1 TABLET BY MOUTH TWICE DAILY Patient taking differently: Take 10 mg by mouth 2 (two) times daily.  11/10/18  Yes Brunetta Genera, MD  memantine (NAMENDA) 10 MG tablet Take 1 tablet  (10 mg total) by mouth 2 (two) times daily. 08/17/18  Yes Charlott Rakes, MD  tamsulosin (FLOMAX) 0.4 MG CAPS capsule Take 1 capsule (0.4 mg total) by mouth daily. 08/17/18  Yes Charlott Rakes, MD  ACCU-CHEK FASTCLIX LANCETS MISC Use as directed 3 times daily. E11.9 11/20/17   Tresa Garter, MD  Blood Glucose Monitoring Suppl (ACCU-CHEK AVIVA PLUS) w/Device KIT Use as directed 3 times daily. E11.9 11/20/17   Angelica Chessman E, MD  glucose blood (ACCU-CHEK AVIVA PLUS) test strip Use as directed 3 times daily. E11.9 04/08/18   Argentina Donovan, PA-C  Insulin Pen Needle 31G X 5 MM MISC Use as directed 04/08/18   Argentina Donovan, PA-C  NITROSTAT 0.4 MG SL tablet Place 1 tablet (0.4 mg total) under the tongue every 5 (five) minutes as needed for chest pain. 09/16/17 09/16/18  Belva Crome, MD    Family History Family History  Problem Relation Age of Onset  . Hypertension Mother     Social History Social History   Tobacco Use  . Smoking status: Never Smoker  . Smokeless tobacco: Never Used  Substance Use Topics  . Alcohol use: No  . Drug use: No     Allergies   Patient has no known allergies.   Review of Systems Review of Systems  Constitutional: Negative for fever.  Respiratory: Positive for shortness of breath. Negative for cough.   Cardiovascular: Negative for chest pain and leg swelling.  Gastrointestinal: Negative for abdominal distention and abdominal pain.  Neurological: Positive for light-headedness (when he first stands up each day, chronic).  All other systems reviewed and are negative.    Physical Exam Updated Vital Signs BP 114/69   Pulse 72   Temp (!) 97.5 F (36.4 C) (Oral)   Resp 16   Ht '5\' 5"'  (1.651 m)   Wt 66.2 kg   SpO2 100%   BMI 24.30 kg/m   Physical Exam  Constitutional: He appears well-developed and well-nourished.  Non-toxic appearance. He does not appear ill. No distress.  HENT:  Head: Normocephalic and atraumatic.  Right Ear:  External ear normal.  Left Ear: External ear normal.  Nose: Nose normal.  Eyes: Right eye exhibits no discharge. Left eye exhibits no discharge.  Neck: Neck supple.  Cardiovascular: Normal rate, regular rhythm and normal heart sounds.  Pulmonary/Chest: Effort normal and breath sounds normal. No accessory muscle usage. No tachypnea.  Abdominal: Soft. There is no tenderness.  Musculoskeletal: He exhibits no edema.       Right lower leg: He exhibits no edema.       Left lower leg: He exhibits no edema.  Neurological: He is alert.  Skin: Skin is warm and dry.  Psychiatric: His mood appears not anxious.  Nursing note and vitals reviewed.    ED Treatments / Results  Labs (all labs ordered are listed, but only  abnormal results are displayed) Labs Reviewed  BASIC METABOLIC PANEL - Abnormal; Notable for the following components:      Result Value   Potassium 5.3 (*)    CO2 21 (*)    Glucose, Bld 132 (*)    Creatinine, Ser 1.72 (*)    GFR calc non Af Amer 35 (*)    GFR calc Af Amer 41 (*)    All other components within normal limits  CBC WITH DIFFERENTIAL/PLATELET - Abnormal; Notable for the following components:   WBC 18.8 (*)    RBC 3.99 (*)    Hemoglobin 9.7 (*)    HCT 33.8 (*)    MCH 24.3 (*)    MCHC 28.7 (*)    RDW 22.9 (*)    Platelets 50 (*)    nRBC 50.5 (*)    nRBC 98 (*)    Lymphs Abs 4.5 (*)    Monocytes Absolute 2.8 (*)    Eosinophils Absolute 0.8 (*)    Basophils Absolute 0.9 (*)    All other components within normal limits  BRAIN NATRIURETIC PEPTIDE  TROPONIN I    EKG EKG Interpretation  Date/Time:  Wednesday December 02 2018 13:28:33 EST Ventricular Rate:  73 PR Interval:    QRS Duration: 98 QT Interval:  389 QTC Calculation: 429 R Axis:   75 Text Interpretation:  Sinus rhythm no acute ST/T changes Confirmed by Sherwood Gambler [id number]) on 12/02/2018 1:38:35 PM Also confirmed by Sherwood Gambler [phone]), editor Shon Hale [id number])  on 12/02/2018  3:28:48 PM   Radiology Dg Chest 2 View  Result Date: 12/02/2018 CLINICAL DATA:  Shortness of breath and weakness EXAM: CHEST - 2 VIEW COMPARISON:  04/21/2018 FINDINGS: Cardiac shadow is stable. Pacing device is again seen and stable. The lungs are well aerated bilaterally. No focal infiltrate or sizable effusion is seen. Mild degenerative changes of the thoracic spine are noted. IMPRESSION: No acute abnormality noted. Electronically Signed   By: Inez Catalina M.D.   On: 12/02/2018 14:12    Procedures Procedures (including critical care time)  Medications Ordered in ED Medications  iopamidol (ISOVUE-370) 76 % injection (has no administration in time range)     Initial Impression / Assessment and Plan / ED Course  I have reviewed the triage vital signs and the nursing notes.  Pertinent labs & imaging results that were available during my care of the patient were reviewed by me and considered in my medical decision making (see chart for details).     Patient has no dyspnea at rest.  His vital signs are unremarkable besides mild hypothermia.  No infectious signs or symptoms.  No chest pain or ACS symptoms besides the exertional dyspnea.  At this point, given negative work-up I will get a CT to help rule out PE or other acute lung pathology.  He is noted to have blasts on his peripheral smear but this is a recurrent issue for him and he has myelodysplastic syndrome.  Care transferred to Dr. Billy Fischer with CT scan pending.  Final Clinical Impressions(s) / ED Diagnoses   Final diagnoses:  None    ED Discharge Orders    None       Sherwood Gambler, MD 12/02/18 1544

## 2018-12-02 NOTE — ED Notes (Signed)
Patient transported to CT 

## 2018-12-02 NOTE — ED Notes (Signed)
Pt's visitor(sitting outside of room) is requesting to know about pt's status.

## 2018-12-02 NOTE — ED Notes (Signed)
Patient verbalizes understanding of discharge instructions. Opportunity for questioning and answers were provided. Armband removed by staff, pt discharged from ED in wheelchair.  

## 2018-12-02 NOTE — ED Triage Notes (Signed)
Onset 2 weeks ago developed shortness of breath and recently past 4 days shortness of breath is worsening with exertion. Denies chest pain.

## 2018-12-07 ENCOUNTER — Other Ambulatory Visit: Payer: Self-pay | Admitting: Hematology

## 2018-12-09 NOTE — Progress Notes (Deleted)
Patient ID: Aaron Nelson, male   DOB: May 29, 1930, 84 y.o.   MRN: [id number]   Seen in ED 12/11 for SOB.  No cardiac/PE issues identified.     From ED note: PE study without abnormalities. No signs of pneumothorax, PE, ACS, pneumonia, CHF. Anemia is at baseline, however may contribute to some dyspnea on exertion.  No chest pain and do not feel inpatient evaluation for anginal equivalent indicated. Recommend follow up with PCP, possible Cardiology and Pulmology evaluation as outpatient.  Reports lightheadedness when standing and some unsteady gait which has been present for months. He does not have focal findings on neuro exam, normal EOM, normal visual fields, normal finger to nose, and history and exam do not suggest acute CVA.  Recommend using walker and following up with PCP. Patient able to ambulate in ED with assistance with normal O2 saturation. Patient discharged in stable condition with understanding of reasons to return.

## 2018-12-09 NOTE — Progress Notes (Signed)
HEMATOLOGY/ONCOLOGY CONSULTATION NOTE  Date of Service: 12/10/2018  Patient Care Team: Charlott Rakes, MD as PCP - General (Family Medicine) Jackelyn Knife, MD as Rounding Team (Internal Medicine)  CHIEF COMPLAINTS/PURPOSE OF CONSULTATION:  Myelofibrosis  Oncologic History:   The pt had a 01/09/17 BM Bx which revealed increased fibrosis with decreased erythrocytes, with microcytosis, hypochromasia, shistocytes, nucleated red blood cells, and BM cellularity at 80-85%. The pt began Lenalidomide 02/05/17 and medication was held single day of administration due to hospitalization with syncope. The pt began Ruxolitnib 59m PO BID on 11/19/17 and has continued this.   HISTORY OF PRESENTING ILLNESS:   [name] a wonderful 84y.o. male who has been previously followed by my colleague Dr. MGrace Isaacfor evaluation and management of Myelfibrosis. He is accompanied today by his daughter-in-law. The pt reports that he is doing well overall.   The pt reports that his neck has not had any new pain from his fall in late April. He has not been re-evaluated for his C1 fracture and stopped wearing his neck collar without consulting a physician.   The pt notes that he has not developed any skin rashes or itching and has remained compliant with taking 13mJakafi BID. The pt notes that his energy levels have been stable.   The pt notes that he had some dizziness prior to his fall in April which occasionally presents still. The pt notes that he describes this as feeling light headed. He notes that his blood sugars have been well controlled and he has been drinking plenty of water. His BP today is at 88/63 and is taking Amlodipne and Namenda.   Most recent lab results (08/17/18) of CBC w/diff is as follows: all values are WNL except for WBC at 18.1k, RBC at 4.06, HGB at 9.9, HCT at 31.7, MCV at 78.1, MCH at 24.3, MCHC at 31.1, ANC at 11.8k, Lymphs abs at 3.8k, Monocytes abs at 1.5k, Basophils  abs at 0.7k, PLT are pending. Ferritin, LDH, CMP 08/17/18 are pending  On review of systems, pt reports stable energy levels, occasional light headedness, and denies recent falls, abdominal pains, leg swelling, skin rashes, itching, concerns for infection, neck pains, and any other symptoms.   Interval History:   Aaron Nelson today for management and evaluation of his myelofibrosis. The patient's last visit with [place] on 08[place]17/18. He is accompanied today by his son. The pt reports that he is doing well overall.   The pt reports that he developed some new SOB about two weeks ago, and presented to the ED on 12/02/18 without any overt findings. He denies feeling SOB today. The pt denies any blood in the stools, black stools, fevers, chills, night sweats. The pt notes that he has been eating well and has enjoyed good energy levels.   The pt notes that he has continued on Jakafi. He does endorse some mild constipation. He also notes that his urination is slower, weaker stream, than it used to be, and sometimes needs to strain. He is currently taking Flomax and previously had a catheter for urinary retention.   On review of systems, pt reports eating well, weight gain, good energy levels, weaker urination, and denies fevers, chills, night sweats, blood in the stools, black stools, itching, abdominal pains, bleeding issues, concerns for infections, mouth sores, noticing any new lumps or bumps, leg swelling, and any other symptoms.   MEDICAL HISTORY:  Past Medical History:  Diagnosis Date  . Diabetes mellitus  without complication ([place])   . Hypertension   . Myelodysplasia (myelodysplastic syndrome) ([place])   . S/P placement of cardiac pacemaker 08/30/2017  . Sinus arrest   . Syncope   . Syncope and collapse 08/30/2017    SURGICAL HISTORY: Past Surgical History:  Procedure Laterality Date  . BONE MARROW BIOPSY  2017  . CATARACT EXTRACTION    . EYE SURGERY    . LOOP RECORDER REMOVAL N/A  08/29/2017   Procedure: LOOP RECORDER REMOVAL;  Surgeon: Deboraha Sprang, MD;  Location: [facility];  Service: Cardiovascular;  Laterality: N/A;  . PACEMAKER IMPLANT Left 08/29/2017   St Jude generator  . PACEMAKER IMPLANT N/A 08/29/2017   Procedure: Pacemaker Implant;  Surgeon: Deboraha Sprang, MD;  Location: [facility];  Service: Cardiovascular;  Laterality: N/A;  . PORTACATH PLACEMENT  2017    SOCIAL HISTORY: Social History   Socioeconomic History  . Marital status: Divorced    Spouse name: Not on file  . Number of children: Not on file  . Years of education: Not on file  . Highest education level: Not on file  Occupational History  . Not on file  Social Needs  . Financial resource strain: Not on file  . Food insecurity:    Worry: Not on file    Inability: Not on file  . Transportation needs:    Medical: Not on file    Non-medical: Not on file  Tobacco Use  . Smoking status: Never Smoker  . Smokeless tobacco: Never Used  Substance and Sexual Activity  . Alcohol use: No  . Drug use: No  . Sexual activity: Not Currently  Lifestyle  . Physical activity:    Days per week: Not on file    Minutes per session: Not on file  . Stress: Not on file  Relationships  . Social connections:    Talks on phone: Not on file    Gets together: Not on file    Attends religious service: Not on file    Active member of club or organization: Not on file    Attends meetings of clubs or organizations: Not on file    Relationship status: Not on file  . Intimate partner violence:    Fear of current or ex partner: Not on file    Emotionally abused: Not on file    Physically abused: Not on file    Forced sexual activity: Not on file  Other Topics Concern  . Not on file  Social History Narrative  . Not on file    FAMILY HISTORY: Family History  Problem Relation Age of Onset  . Hypertension Mother     ALLERGIES:  has No Known Allergies.  MEDICATIONS:  Current  Outpatient Medications  Medication Sig Dispense Refill  . ACCU-CHEK FASTCLIX LANCETS MISC Use as directed 3 times daily. E11.9 100 each 12  . acetaminophen (TYLENOL) 325 MG tablet Take 2 tablets (650 mg total) by mouth every 4 (four) hours as needed for mild pain.    Marland Kitchen amLODipine (NORVASC) 10 MG tablet TAKE 1 TABLET BY MOUTH DAILY (Patient taking differently: Take 10 mg by mouth daily. ) 90 tablet 3  . aspirin EC 81 MG tablet Take 81 mg by mouth daily.    . Blood Glucose Monitoring Suppl (ACCU-CHEK AVIVA PLUS) w/Device KIT Use as directed 3 times daily. E11.9 1 kit 0  . glucose blood (ACCU-CHEK AVIVA PLUS) test strip Use as directed 3 times daily. E11.9 100  each 12  . Insulin Glargine (LANTUS SOLOSTAR) 100 UNIT/ML Solostar Pen Inject 40 Units into the skin every morning. 15 mL 3  . Insulin Pen Needle 31G X 5 MM MISC Use as directed 100 each 3  . insulin regular (HUMULIN R) 100 units/mL injection Inject 0.1 mLs (10 Units total) into the skin 3 (three) times daily before meals. For Blood Sugar greater than 250 10 mL 11  . isosorbide mononitrate (IMDUR) 30 MG 24 hr tablet Take 30 mg by mouth daily.  11  . JAKAFI 10 MG tablet TAKE 1 TABLET BY MOUTH TWICE DAILY 60 tablet 0  . memantine (NAMENDA) 10 MG tablet Take 1 tablet (10 mg total) by mouth 2 (two) times daily. 60 tablet 3  . tamsulosin (FLOMAX) 0.4 MG CAPS capsule Take 1 capsule (0.4 mg total) by mouth daily. 30 capsule 3  . ferrous sulfate 325 (65 FE) MG EC tablet Take 1 tablet (325 mg total) by mouth 3 (three) times daily with meals. 90 tablet 2  . NITROSTAT 0.4 MG SL tablet Place 1 tablet (0.4 mg total) under the tongue every 5 (five) minutes as needed for chest pain. 25 tablet 3   No current facility-administered medications for this visit.     REVIEW OF SYSTEMS:    A 10+ POINT REVIEW OF SYSTEMS WAS OBTAINED including neurology, dermatology, psychiatry, cardiac, respiratory, lymph, extremities, GI, GU, Musculoskeletal, constitutional,  breasts, reproductive, HEENT.  All pertinent positives are noted in the HPI.  All others are negative.   PHYSICAL EXAMINATION: ECOG PERFORMANCE STATUS: 3 - Symptomatic, >50% confined to bed  . Vitals:   12/10/18 1543  BP: 125/69  Pulse: 77  Resp: 18  Temp: (!) 97.5 F (36.4 C)  SpO2: 100%   Filed Weights   12/10/18 1543  Weight: 162 lb 8 oz (73.7 kg)   .Body mass index is 27.04 kg/m.  GENERAL:alert, in no acute distress and comfortable SKIN: no acute rashes, no significant lesions EYES: conjunctiva are pink and non-injected, sclera anicteric OROPHARYNX: MMM, no exudates, no oropharyngeal erythema or ulceration NECK: supple, no JVD LYMPH:  no palpable lymphadenopathy in the cervical, axillary or inguinal regions LUNGS: clear to auscultation b/l with normal respiratory effort Nelson: regular rate & rhythm ABDOMEN:  normoactive bowel sounds , non tender, not distended. No palpable hepatosplenomegaly.  Extremity: no pedal edema PSYCH: alert & oriented x 3 with fluent speech NEURO: no focal motor/sensory deficits   LABORATORY DATA:  I have reviewed the data as listed  . CBC Latest Ref Rng & Units 12/02/2018 08/17/2018 05/04/2018  WBC 4.0 - 10.5 K/uL 18.8(H) 18.1(H) 13.7(H)  Hemoglobin 13.0 - 17.0 g/dL 9.7(L) 9.9(L) 8.9(L)  Hematocrit 39.0 - 52.0 % 33.8(L) 31.7(L) 29.7(L)  Platelets 150 - 400 K/uL 50(L) 52(L) 82(L)    . CMP Latest Ref Rng & Units 12/02/2018 08/17/2018 05/04/2018  Glucose 70 - 99 mg/dL 132(H) 68(L) 265(H)  BUN 8 - 23 mg/dL 19 28(H) 15  Creatinine 0.61 - 1.24 mg/dL 1.72(H) 1.83(H) 1.53(H)  Sodium 135 - 145 mmol/L 141 139 133(L)  Potassium 3.5 - 5.1 mmol/L 5.3(H) 4.6 4.9  Chloride 98 - 111 mmol/L 109 106 102  CO2 22 - 32 mmol/L 21(L) 26 22  Calcium 8.9 - 10.3 mg/dL 8.9 9.8 9.1  Total Protein 6.5 - 8.1 g/dL - 7.8 7.3  Total Bilirubin 0.3 - 1.2 mg/dL - 1.0 1.1  Alkaline Phos 38 - 126 U/L - 125 130  AST 15 - 41 U/L - 52(H)  55(H)  ALT 0 - 44 U/L - 25 20    01/09/17 BM Bx:   02/04/14 BM Bx:     RADIOGRAPHIC STUDIES: I have personally reviewed the radiological images as listed and agreed with the findings in the report. Dg Chest 2 View  Result Date: 12/02/2018 CLINICAL DATA:  Shortness of breath and weakness EXAM: CHEST - 2 VIEW COMPARISON:  04/21/2018 FINDINGS: Cardiac shadow is stable. Pacing device is again seen and stable. The lungs are well aerated bilaterally. No focal infiltrate or sizable effusion is seen. Mild degenerative changes of the thoracic spine are noted. IMPRESSION: No acute abnormality noted. Electronically Signed   By: Inez Catalina M.D.   On: 12/02/2018 14:12   Ct Angio Chest Pe W/cm &/or Wo Cm  Result Date: 12/02/2018 CLINICAL DATA:  Shortness of breath. EXAM: CT ANGIOGRAPHY CHEST WITH CONTRAST TECHNIQUE: Multidetector CT imaging of the chest was performed using the standard protocol during bolus administration of intravenous contrast. Multiplanar CT image reconstructions and MIPs were obtained to evaluate the vascular anatomy. CONTRAST:  67m ISOVUE-370 IOPAMIDOL (ISOVUE-370) INJECTION 76% COMPARISON:  Chest x-ray dated 12/02/2018 and CT angiogram of the chest dated 11/26/2017 FINDINGS: Cardiovascular: Satisfactory opacification of the pulmonary arteries to the segmental level. No evidence of pulmonary embolism. Normal Nelson size. No pericardial effusion. Mediastinum/Nodes: No enlarged mediastinal, hilar, or axillary lymph nodes. Thyroid gland, trachea, and esophagus demonstrate no significant findings. Lungs/Pleura: Lungs are clear except for a small bleb in the posterior aspect of the left upper lobe. No pleural effusion or pneumothorax. Upper Abdomen: Chronic splenomegaly. Aortic atherosclerosis. Musculoskeletal: No chest wall abnormality. No acute or significant osseous findings. Review of the MIP images confirms the above findings. IMPRESSION: 1. No pulmonary emboli or other acute abnormalities. 2. Chronic splenomegaly.  Electronically Signed   By: JLorriane ShireM.D.   On: 12/02/2018 16:30    ASSESSMENT & PLAN:  84y.o. male with  1. Jak2 mutation positive Myelofibrosis . Lab Results  Component Value Date   LDH 2,176 (H) 12/10/2018    2. Iron deficiency anemia . Lab Results  Component Value Date   IRON 72 12/10/2018   TIBC 300 12/10/2018   IRONPCTSAT 24 12/10/2018   (Iron and TIBC)  Lab Results  Component Value Date   FERRITIN 465 (H) 12/10/2018   2. Thrombocytopenia  PLAN: -Discussed pt labwork today, 12/10/18; hgb stable in 9.5-10 range, platelets around 50k -The pt has no prohibitive toxicities from continuing 158mJakafi BID at this time. -Will order Senna S for constipation -Advised that the pt discuss his weaker urination with his PCP Dr. EnCharlott Rakesand he is currently taking Flomax  -Stressed the importance of the pt wearing his neck brace again, after his C1 fracture, and look to be evaluated by a neurosurgeon- will defer this mx to PCP. -Will see the pt back in 2-3 months   RTC with Dr KaIrene Limboith labs in 2 months   All of the patients questions were answered with apparent satisfaction. The patient knows to call the clinic with any problems, questions or concerns.  The total time spent in the appt was 20 minutes and more than 50% was on counseling and direct patient cares.    GaSullivan LoneD MSLaGrangeAHIVMS SCSaint Thomas Hospital For Specialty SurgeryTSharp Mesa Vista Hospitalematology/Oncology Physician [facility]Pal[facility](Office):       [phone] cell):  [phone]):           [phone]/19/2019 3:46 PM  I, ScBaldwin [place]  am acting as a scribe for Dr. Sullivan Lone.   .I have reviewed the above documentation for accuracy and completeness, and I agree with the above. Brunetta Genera MD

## 2018-12-10 ENCOUNTER — Telehealth: Payer: Self-pay

## 2018-12-10 ENCOUNTER — Inpatient Hospital Stay: Payer: Medicare HMO

## 2018-12-10 ENCOUNTER — Inpatient Hospital Stay: Payer: Medicare HMO | Attending: Hematology | Admitting: Hematology

## 2018-12-10 ENCOUNTER — Encounter: Payer: Self-pay | Admitting: Hematology

## 2018-12-10 VITALS — BP 125/69 | HR 77 | Temp 97.5°F | Resp 18 | Ht 65.0 in | Wt 162.5 lb

## 2018-12-10 DIAGNOSIS — D696 Thrombocytopenia, unspecified: Secondary | ICD-10-CM | POA: Insufficient documentation

## 2018-12-10 DIAGNOSIS — D469 Myelodysplastic syndrome, unspecified: Secondary | ICD-10-CM | POA: Diagnosis present

## 2018-12-10 DIAGNOSIS — Z950 Presence of cardiac pacemaker: Secondary | ICD-10-CM | POA: Diagnosis not present

## 2018-12-10 DIAGNOSIS — Z4682 Encounter for fitting and adjustment of non-vascular catheter: Secondary | ICD-10-CM | POA: Diagnosis not present

## 2018-12-10 DIAGNOSIS — S2232XA Fracture of one rib, left side, initial encounter for closed fracture: Secondary | ICD-10-CM | POA: Diagnosis present

## 2018-12-10 DIAGNOSIS — Z515 Encounter for palliative care: Secondary | ICD-10-CM | POA: Diagnosis not present

## 2018-12-10 DIAGNOSIS — S3993XA Unspecified injury of pelvis, initial encounter: Secondary | ICD-10-CM | POA: Diagnosis not present

## 2018-12-10 DIAGNOSIS — R402214 Coma scale, best verbal response, none, 24 hours or more after hospital admission: Secondary | ICD-10-CM | POA: Diagnosis not present

## 2018-12-10 DIAGNOSIS — S065X9A Traumatic subdural hemorrhage with loss of consciousness of unspecified duration, initial encounter: Secondary | ICD-10-CM | POA: Diagnosis present

## 2018-12-10 DIAGNOSIS — I951 Orthostatic hypotension: Secondary | ICD-10-CM | POA: Diagnosis not present

## 2018-12-10 DIAGNOSIS — F0280 Dementia in other diseases classified elsewhere without behavioral disturbance: Secondary | ICD-10-CM | POA: Diagnosis present

## 2018-12-10 DIAGNOSIS — R402114 Coma scale, eyes open, never, 24 hours or more after hospital admission: Secondary | ICD-10-CM | POA: Diagnosis not present

## 2018-12-10 DIAGNOSIS — L89126 Pressure-induced deep tissue damage of left upper back: Secondary | ICD-10-CM | POA: Diagnosis present

## 2018-12-10 DIAGNOSIS — S3991XA Unspecified injury of abdomen, initial encounter: Secondary | ICD-10-CM | POA: Diagnosis not present

## 2018-12-10 DIAGNOSIS — D72829 Elevated white blood cell count, unspecified: Secondary | ICD-10-CM | POA: Diagnosis present

## 2018-12-10 DIAGNOSIS — Z79899 Other long term (current) drug therapy: Secondary | ICD-10-CM

## 2018-12-10 DIAGNOSIS — N179 Acute kidney failure, unspecified: Secondary | ICD-10-CM | POA: Diagnosis present

## 2018-12-10 DIAGNOSIS — S199XXA Unspecified injury of neck, initial encounter: Secondary | ICD-10-CM | POA: Diagnosis not present

## 2018-12-10 DIAGNOSIS — Z23 Encounter for immunization: Secondary | ICD-10-CM | POA: Diagnosis present

## 2018-12-10 DIAGNOSIS — I169 Hypertensive crisis, unspecified: Secondary | ICD-10-CM | POA: Diagnosis present

## 2018-12-10 DIAGNOSIS — Z794 Long term (current) use of insulin: Secondary | ICD-10-CM

## 2018-12-10 DIAGNOSIS — I10 Essential (primary) hypertension: Secondary | ICD-10-CM | POA: Insufficient documentation

## 2018-12-10 DIAGNOSIS — J9600 Acute respiratory failure, unspecified whether with hypoxia or hypercapnia: Secondary | ICD-10-CM | POA: Diagnosis not present

## 2018-12-10 DIAGNOSIS — R402314 Coma scale, best motor response, none, 24 hours or more after hospital admission: Secondary | ICD-10-CM | POA: Diagnosis not present

## 2018-12-10 DIAGNOSIS — Z7982 Long term (current) use of aspirin: Secondary | ICD-10-CM | POA: Insufficient documentation

## 2018-12-10 DIAGNOSIS — I611 Nontraumatic intracerebral hemorrhage in hemisphere, cortical: Secondary | ICD-10-CM | POA: Diagnosis not present

## 2018-12-10 DIAGNOSIS — N400 Enlarged prostate without lower urinary tract symptoms: Secondary | ICD-10-CM | POA: Diagnosis present

## 2018-12-10 DIAGNOSIS — Z66 Do not resuscitate: Secondary | ICD-10-CM | POA: Diagnosis not present

## 2018-12-10 DIAGNOSIS — S0101XA Laceration without foreign body of scalp, initial encounter: Secondary | ICD-10-CM | POA: Diagnosis present

## 2018-12-10 DIAGNOSIS — D509 Iron deficiency anemia, unspecified: Secondary | ICD-10-CM | POA: Diagnosis not present

## 2018-12-10 DIAGNOSIS — G309 Alzheimer's disease, unspecified: Secondary | ICD-10-CM | POA: Diagnosis not present

## 2018-12-10 DIAGNOSIS — S0990XA Unspecified injury of head, initial encounter: Secondary | ICD-10-CM | POA: Diagnosis not present

## 2018-12-10 DIAGNOSIS — I495 Sick sinus syndrome: Secondary | ICD-10-CM | POA: Diagnosis present

## 2018-12-10 DIAGNOSIS — I499 Cardiac arrhythmia, unspecified: Secondary | ICD-10-CM | POA: Diagnosis present

## 2018-12-10 DIAGNOSIS — Z978 Presence of other specified devices: Secondary | ICD-10-CM | POA: Diagnosis not present

## 2018-12-10 DIAGNOSIS — Z7189 Other specified counseling: Secondary | ICD-10-CM | POA: Diagnosis not present

## 2018-12-10 DIAGNOSIS — W1839XA Other fall on same level, initial encounter: Secondary | ICD-10-CM | POA: Diagnosis not present

## 2018-12-10 DIAGNOSIS — R42 Dizziness and giddiness: Secondary | ICD-10-CM | POA: Diagnosis not present

## 2018-12-10 DIAGNOSIS — E1165 Type 2 diabetes mellitus with hyperglycemia: Secondary | ICD-10-CM | POA: Diagnosis not present

## 2018-12-10 DIAGNOSIS — Y92009 Unspecified place in unspecified non-institutional (private) residence as the place of occurrence of the external cause: Secondary | ICD-10-CM

## 2018-12-10 DIAGNOSIS — D7581 Myelofibrosis: Secondary | ICD-10-CM

## 2018-12-10 DIAGNOSIS — R296 Repeated falls: Secondary | ICD-10-CM | POA: Diagnosis present

## 2018-12-10 DIAGNOSIS — R4182 Altered mental status, unspecified: Secondary | ICD-10-CM | POA: Diagnosis present

## 2018-12-10 DIAGNOSIS — Z1589 Genetic susceptibility to other disease: Secondary | ICD-10-CM

## 2018-12-10 DIAGNOSIS — S299XXA Unspecified injury of thorax, initial encounter: Secondary | ICD-10-CM | POA: Diagnosis not present

## 2018-12-10 DIAGNOSIS — Z8249 Family history of ischemic heart disease and other diseases of the circulatory system: Secondary | ICD-10-CM | POA: Diagnosis not present

## 2018-12-10 DIAGNOSIS — R55 Syncope and collapse: Secondary | ICD-10-CM | POA: Diagnosis not present

## 2018-12-10 DIAGNOSIS — E11649 Type 2 diabetes mellitus with hypoglycemia without coma: Secondary | ICD-10-CM | POA: Diagnosis present

## 2018-12-10 DIAGNOSIS — I618 Other nontraumatic intracerebral hemorrhage: Secondary | ICD-10-CM | POA: Diagnosis not present

## 2018-12-10 DIAGNOSIS — D630 Anemia in neoplastic disease: Secondary | ICD-10-CM | POA: Diagnosis present

## 2018-12-10 DIAGNOSIS — Z8674 Personal history of sudden cardiac arrest: Secondary | ICD-10-CM

## 2018-12-10 DIAGNOSIS — R0683 Snoring: Secondary | ICD-10-CM | POA: Diagnosis not present

## 2018-12-10 LAB — CBC WITH DIFFERENTIAL (CANCER CENTER ONLY)
Abs Immature Granulocytes: 2.6 10*3/uL — ABNORMAL HIGH (ref 0.00–0.07)
BASOS ABS: 0.1 10*3/uL (ref 0.0–0.1)
Band Neutrophils: 7 %
Basophils Relative: 1 %
Blasts: 19 %
Eosinophils Absolute: 0.1 10*3/uL (ref 0.0–0.5)
Eosinophils Relative: 1 %
HEMATOCRIT: 33 % — AB (ref 39.0–52.0)
Hemoglobin: 9.6 g/dL — ABNORMAL LOW (ref 13.0–17.0)
Lymphocytes Relative: 28 %
Lymphs Abs: 3.6 10*3/uL (ref 0.7–4.0)
MCH: 24.9 pg — ABNORMAL LOW (ref 26.0–34.0)
MCHC: 29.1 g/dL — ABNORMAL LOW (ref 30.0–36.0)
MCV: 85.5 fL (ref 80.0–100.0)
Metamyelocytes Relative: 4 %
Monocytes Absolute: 1.4 10*3/uL — ABNORMAL HIGH (ref 0.1–1.0)
Monocytes Relative: 11 %
Myelocytes: 16 %
NRBC: 101 /100{WBCs} — AB
Neutro Abs: 2.6 10*3/uL (ref 1.7–17.7)
Neutrophils Relative %: 13 %
Platelet Count: 46 10*3/uL — ABNORMAL LOW (ref 150–400)
RBC: 3.86 MIL/uL — ABNORMAL LOW (ref 4.22–5.81)
RDW: 23.4 % — ABNORMAL HIGH (ref 11.5–15.5)
WBC Count: 12.9 10*3/uL — ABNORMAL HIGH (ref 4.0–10.5)
nRBC: 57.4 % — ABNORMAL HIGH (ref 0.0–0.2)

## 2018-12-10 LAB — COMPREHENSIVE METABOLIC PANEL
ALT: 27 U/L (ref 0–44)
AST: 56 U/L — ABNORMAL HIGH (ref 15–41)
Albumin: 3.9 g/dL (ref 3.5–5.0)
Alkaline Phosphatase: 94 U/L (ref 38–126)
Anion gap: 11 (ref 5–15)
BUN: 24 mg/dL — AB (ref 8–23)
CO2: 23 mmol/L (ref 22–32)
Calcium: 9 mg/dL (ref 8.9–10.3)
Chloride: 106 mmol/L (ref 98–111)
Creatinine, Ser: 1.84 mg/dL — ABNORMAL HIGH (ref 0.61–1.24)
GFR calc Af Amer: 38 mL/min — ABNORMAL LOW (ref >60)
GFR calc non Af Amer: 33 mL/min — ABNORMAL LOW (ref >60)
Glucose, Bld: 211 mg/dL — ABNORMAL HIGH (ref 70–99)
Potassium: 4.9 mmol/L (ref 3.5–5.1)
Sodium: 140 mmol/L (ref 135–145)
Total Bilirubin: 1.6 mg/dL — ABNORMAL HIGH (ref 0.3–1.2)
Total Protein: 7 g/dL (ref 6.5–8.1)

## 2018-12-10 NOTE — Telephone Encounter (Signed)
Printed avs and calender of upcoming appointment. Per 12/19 los 

## 2018-12-11 LAB — IRON AND TIBC
IRON: 72 ug/dL (ref 42–163)
Saturation Ratios: 24 % (ref 20–55)
TIBC: 300 ug/dL (ref 202–409)
UIBC: 229 ug/dL (ref 117–376)

## 2018-12-11 LAB — FERRITIN: Ferritin: 465 ng/mL — ABNORMAL HIGH (ref 24–336)

## 2018-12-11 LAB — LACTATE DEHYDROGENASE: LDH: 2176 U/L — ABNORMAL HIGH (ref 98–192)

## 2018-12-12 ENCOUNTER — Emergency Department (HOSPITAL_COMMUNITY): Payer: Medicare HMO

## 2018-12-12 ENCOUNTER — Other Ambulatory Visit: Payer: Self-pay

## 2018-12-12 ENCOUNTER — Inpatient Hospital Stay (HOSPITAL_COMMUNITY)
Admission: EM | Admit: 2018-12-12 | Discharge: 2018-12-23 | DRG: 082 | Disposition: E | Payer: Medicare HMO | Attending: Internal Medicine | Admitting: Internal Medicine

## 2018-12-12 DIAGNOSIS — F0280 Dementia in other diseases classified elsewhere without behavioral disturbance: Secondary | ICD-10-CM | POA: Diagnosis present

## 2018-12-12 DIAGNOSIS — R42 Dizziness and giddiness: Secondary | ICD-10-CM

## 2018-12-12 DIAGNOSIS — Z66 Do not resuscitate: Secondary | ICD-10-CM | POA: Diagnosis not present

## 2018-12-12 DIAGNOSIS — W19XXXA Unspecified fall, initial encounter: Secondary | ICD-10-CM

## 2018-12-12 DIAGNOSIS — I499 Cardiac arrhythmia, unspecified: Secondary | ICD-10-CM | POA: Diagnosis present

## 2018-12-12 DIAGNOSIS — Z794 Long term (current) use of insulin: Secondary | ICD-10-CM

## 2018-12-12 DIAGNOSIS — I10 Essential (primary) hypertension: Secondary | ICD-10-CM | POA: Diagnosis present

## 2018-12-12 DIAGNOSIS — Z8249 Family history of ischemic heart disease and other diseases of the circulatory system: Secondary | ICD-10-CM | POA: Diagnosis not present

## 2018-12-12 DIAGNOSIS — Z950 Presence of cardiac pacemaker: Secondary | ICD-10-CM | POA: Diagnosis present

## 2018-12-12 DIAGNOSIS — IMO0001 Reserved for inherently not codable concepts without codable children: Secondary | ICD-10-CM

## 2018-12-12 DIAGNOSIS — Z515 Encounter for palliative care: Secondary | ICD-10-CM

## 2018-12-12 DIAGNOSIS — S2232XA Fracture of one rib, left side, initial encounter for closed fracture: Secondary | ICD-10-CM | POA: Diagnosis present

## 2018-12-12 DIAGNOSIS — R402214 Coma scale, best verbal response, none, 24 hours or more after hospital admission: Secondary | ICD-10-CM | POA: Diagnosis not present

## 2018-12-12 DIAGNOSIS — S0101XA Laceration without foreign body of scalp, initial encounter: Secondary | ICD-10-CM

## 2018-12-12 DIAGNOSIS — R296 Repeated falls: Secondary | ICD-10-CM | POA: Diagnosis present

## 2018-12-12 DIAGNOSIS — J9600 Acute respiratory failure, unspecified whether with hypoxia or hypercapnia: Secondary | ICD-10-CM | POA: Diagnosis not present

## 2018-12-12 DIAGNOSIS — Z7982 Long term (current) use of aspirin: Secondary | ICD-10-CM | POA: Diagnosis not present

## 2018-12-12 DIAGNOSIS — Y92009 Unspecified place in unspecified non-institutional (private) residence as the place of occurrence of the external cause: Secondary | ICD-10-CM | POA: Diagnosis not present

## 2018-12-12 DIAGNOSIS — Z7189 Other specified counseling: Secondary | ICD-10-CM | POA: Diagnosis not present

## 2018-12-12 DIAGNOSIS — R402114 Coma scale, eyes open, never, 24 hours or more after hospital admission: Secondary | ICD-10-CM | POA: Diagnosis not present

## 2018-12-12 DIAGNOSIS — E1165 Type 2 diabetes mellitus with hyperglycemia: Secondary | ICD-10-CM

## 2018-12-12 DIAGNOSIS — R4182 Altered mental status, unspecified: Secondary | ICD-10-CM | POA: Diagnosis present

## 2018-12-12 DIAGNOSIS — I495 Sick sinus syndrome: Secondary | ICD-10-CM | POA: Diagnosis present

## 2018-12-12 DIAGNOSIS — L8990 Pressure ulcer of unspecified site, unspecified stage: Secondary | ICD-10-CM

## 2018-12-12 DIAGNOSIS — D469 Myelodysplastic syndrome, unspecified: Secondary | ICD-10-CM | POA: Diagnosis present

## 2018-12-12 DIAGNOSIS — I611 Nontraumatic intracerebral hemorrhage in hemisphere, cortical: Secondary | ICD-10-CM | POA: Diagnosis not present

## 2018-12-12 DIAGNOSIS — W1839XA Other fall on same level, initial encounter: Secondary | ICD-10-CM | POA: Diagnosis present

## 2018-12-12 DIAGNOSIS — R402314 Coma scale, best motor response, none, 24 hours or more after hospital admission: Secondary | ICD-10-CM | POA: Diagnosis not present

## 2018-12-12 DIAGNOSIS — D7581 Myelofibrosis: Secondary | ICD-10-CM | POA: Diagnosis present

## 2018-12-12 DIAGNOSIS — N179 Acute kidney failure, unspecified: Secondary | ICD-10-CM | POA: Diagnosis present

## 2018-12-12 DIAGNOSIS — G309 Alzheimer's disease, unspecified: Secondary | ICD-10-CM | POA: Diagnosis present

## 2018-12-12 DIAGNOSIS — I169 Hypertensive crisis, unspecified: Secondary | ICD-10-CM | POA: Diagnosis present

## 2018-12-12 DIAGNOSIS — Z978 Presence of other specified devices: Secondary | ICD-10-CM | POA: Diagnosis not present

## 2018-12-12 DIAGNOSIS — R55 Syncope and collapse: Secondary | ICD-10-CM | POA: Diagnosis not present

## 2018-12-12 DIAGNOSIS — Z23 Encounter for immunization: Secondary | ICD-10-CM | POA: Diagnosis present

## 2018-12-12 DIAGNOSIS — S065X9A Traumatic subdural hemorrhage with loss of consciousness of unspecified duration, initial encounter: Secondary | ICD-10-CM | POA: Diagnosis present

## 2018-12-12 LAB — COMPREHENSIVE METABOLIC PANEL
ALT: 28 U/L (ref 0–44)
AST: 64 U/L — ABNORMAL HIGH (ref 15–41)
Albumin: 4 g/dL (ref 3.5–5.0)
Alkaline Phosphatase: 101 U/L (ref 38–126)
Anion gap: 10 (ref 5–15)
BUN: 18 mg/dL (ref 8–23)
CO2: 23 mmol/L (ref 22–32)
Calcium: 9.3 mg/dL (ref 8.9–10.3)
Chloride: 105 mmol/L (ref 98–111)
Creatinine, Ser: 1.81 mg/dL — ABNORMAL HIGH (ref 0.61–1.24)
GFR calc Af Amer: 39 mL/min — ABNORMAL LOW (ref >60)
GFR calc non Af Amer: 33 mL/min — ABNORMAL LOW (ref >60)
Glucose, Bld: 68 mg/dL — ABNORMAL LOW (ref 70–99)
Potassium: 4.5 mmol/L (ref 3.5–5.1)
Sodium: 138 mmol/L (ref 135–145)
Total Bilirubin: 1.3 mg/dL — ABNORMAL HIGH (ref 0.3–1.2)
Total Protein: 7.8 g/dL (ref 6.5–8.1)

## 2018-12-12 LAB — URINALYSIS, ROUTINE W REFLEX MICROSCOPIC
Bilirubin Urine: NEGATIVE
Glucose, UA: 100 mg/dL — AB
Ketones, ur: NEGATIVE mg/dL
LEUKOCYTES UA: NEGATIVE
Nitrite: NEGATIVE
Protein, ur: NEGATIVE mg/dL
Specific Gravity, Urine: <1.005 — ABNORMAL LOW (ref 1.005–1.030)
pH: 6.5 (ref 5.0–8.0)

## 2018-12-12 LAB — CBC WITH DIFFERENTIAL/PLATELET
Abs Immature Granulocytes: 4.5 10*3/uL — ABNORMAL HIGH (ref 0.00–0.07)
Band Neutrophils: 6 %
Basophils Absolute: 2.4 10*3/uL — ABNORMAL HIGH (ref 0.0–0.1)
Basophils Relative: 12 %
Blasts: 10 %
Eosinophils Absolute: 0 10*3/uL (ref 0.0–0.5)
Eosinophils Relative: 0 %
HCT: 35.1 % — ABNORMAL LOW (ref 39.0–52.0)
Hemoglobin: 9.9 g/dL — ABNORMAL LOW (ref 13.0–17.0)
Lymphocytes Relative: 22 %
Lymphs Abs: 4.3 10*3/uL — ABNORMAL HIGH (ref 0.7–4.0)
MCH: 23.9 pg — ABNORMAL LOW (ref 26.0–34.0)
MCHC: 28.2 g/dL — ABNORMAL LOW (ref 30.0–36.0)
MCV: 84.6 fL (ref 80.0–100.0)
Metamyelocytes Relative: 15 %
Monocytes Absolute: 2.2 10*3/uL — ABNORMAL HIGH (ref 0.1–1.0)
Monocytes Relative: 11 %
Myelocytes: 7 %
Neutro Abs: 4.3 10*3/uL (ref 1.7–7.7)
Neutrophils Relative %: 16 %
Platelets: 50 10*3/uL — ABNORMAL LOW (ref 150–400)
Promyelocytes Relative: 1 %
RBC: 4.15 MIL/uL — ABNORMAL LOW (ref 4.22–5.81)
RDW: 23.1 % — ABNORMAL HIGH (ref 11.5–15.5)
WBC: 19.6 10*3/uL — ABNORMAL HIGH (ref 4.0–10.5)
nRBC: 48.2 % — ABNORMAL HIGH (ref 0.0–0.2)
nRBC: 69 /100 WBC — ABNORMAL HIGH

## 2018-12-12 LAB — GLUCOSE, CAPILLARY: Glucose-Capillary: 88 mg/dL (ref 70–99)

## 2018-12-12 LAB — TYPE AND SCREEN
ABO/RH(D): AB POS
Antibody Screen: NEGATIVE

## 2018-12-12 LAB — CBG MONITORING, ED
GLUCOSE-CAPILLARY: 65 mg/dL — AB (ref 70–99)
Glucose-Capillary: 95 mg/dL (ref 70–99)

## 2018-12-12 LAB — TROPONIN I: Troponin I: <0.03 ng/mL (ref <0.03)

## 2018-12-12 LAB — APTT: aPTT: 25 seconds (ref 24–36)

## 2018-12-12 LAB — PROTIME-INR
INR: 1.16
Prothrombin Time: 14.7 seconds (ref 11.4–15.2)

## 2018-12-12 LAB — URINALYSIS, MICROSCOPIC (REFLEX): Squamous Epithelial / HPF: NONE SEEN (ref 0–5)

## 2018-12-12 LAB — ABO/RH: ABO/RH(D): AB POS

## 2018-12-12 MED ORDER — SODIUM CHLORIDE 0.9 % IV SOLN
INTRAVENOUS | Status: DC
  Administered 2018-12-13 – 2018-12-14 (×4): via INTRAVENOUS

## 2018-12-12 MED ORDER — DEXTROSE 10 % IV BOLUS
500.0000 mL | Freq: Once | INTRAVENOUS | Status: AC
  Administered 2018-12-12: 500 mL via INTRAVENOUS

## 2018-12-12 MED ORDER — ACETAMINOPHEN 325 MG PO TABS
625.0000 mg | ORAL_TABLET | ORAL | Status: DC | PRN
  Administered 2018-12-14: 650 mg via ORAL
  Filled 2018-12-12: qty 2

## 2018-12-12 MED ORDER — AMLODIPINE BESYLATE 5 MG PO TABS
10.0000 mg | ORAL_TABLET | Freq: Every day | ORAL | Status: DC
  Administered 2018-12-13 – 2018-12-14 (×2): 10 mg via ORAL
  Filled 2018-12-12 (×2): qty 2

## 2018-12-12 MED ORDER — INSULIN ASPART 100 UNIT/ML ~~LOC~~ SOLN: 0.0000 [IU] | Freq: Every day | SUBCUTANEOUS | Status: DC

## 2018-12-12 MED ORDER — INSULIN ASPART 100 UNIT/ML ~~LOC~~ SOLN
0.0000 [IU] | Freq: Three times a day (TID) | SUBCUTANEOUS | Status: DC
  Administered 2018-12-13 – 2018-12-14 (×3): 2 [IU] via SUBCUTANEOUS
  Administered 2018-12-15: 1 [IU] via SUBCUTANEOUS
  Administered 2018-12-15 (×2): 2 [IU] via SUBCUTANEOUS

## 2018-12-12 MED ORDER — ONDANSETRON HCL 4 MG/2ML IJ SOLN: 4.0000 mg | Freq: Four times a day (QID) | INTRAMUSCULAR | Status: DC | PRN

## 2018-12-12 MED ORDER — NITROGLYCERIN 0.4 MG SL SUBL: 0.4000 mg | SUBLINGUAL_TABLET | SUBLINGUAL | Status: DC | PRN

## 2018-12-12 MED ORDER — FERROUS SULFATE 325 (65 FE) MG PO TABS
325.0000 mg | ORAL_TABLET | Freq: Three times a day (TID) | ORAL | Status: DC
  Administered 2018-12-13 – 2018-12-15 (×9): 325 mg via ORAL
  Filled 2018-12-12 (×9): qty 1

## 2018-12-12 MED ORDER — MEMANTINE HCL 10 MG PO TABS
10.0000 mg | ORAL_TABLET | Freq: Two times a day (BID) | ORAL | Status: DC
  Administered 2018-12-13 – 2018-12-15 (×7): 10 mg via ORAL
  Filled 2018-12-12 (×8): qty 1

## 2018-12-12 MED ORDER — ASPIRIN EC 81 MG PO TBEC
81.0000 mg | DELAYED_RELEASE_TABLET | Freq: Every day | ORAL | Status: DC
  Administered 2018-12-13 – 2018-12-15 (×3): 81 mg via ORAL
  Filled 2018-12-12 (×3): qty 1

## 2018-12-12 MED ORDER — HEPARIN SODIUM (PORCINE) 5000 UNIT/ML IJ SOLN
5000.0000 [IU] | Freq: Three times a day (TID) | INTRAMUSCULAR | Status: DC
  Administered 2018-12-13 – 2018-12-15 (×8): 5000 [IU] via SUBCUTANEOUS
  Filled 2018-12-12 (×8): qty 1

## 2018-12-12 MED ORDER — ONDANSETRON HCL 4 MG PO TABS: 4.0000 mg | ORAL_TABLET | Freq: Four times a day (QID) | ORAL | Status: DC | PRN

## 2018-12-12 MED ORDER — RUXOLITINIB PHOSPHATE 10 MG PO TABS: 10.0000 mg | ORAL_TABLET | Freq: Two times a day (BID) | ORAL | Status: DC

## 2018-12-12 MED ORDER — INSULIN GLARGINE 100 UNIT/ML ~~LOC~~ SOLN
40.0000 [IU] | Freq: Every day | SUBCUTANEOUS | Status: DC
  Filled 2018-12-12: qty 0.4

## 2018-12-12 MED ORDER — LABETALOL HCL 5 MG/ML IV SOLN: 10.0000 mg | Freq: Once | INTRAVENOUS | Status: DC

## 2018-12-12 MED ORDER — ISOSORBIDE MONONITRATE ER 30 MG PO TB24
30.0000 mg | ORAL_TABLET | Freq: Every day | ORAL | Status: DC
  Administered 2018-12-13 – 2018-12-15 (×3): 30 mg via ORAL
  Filled 2018-12-12 (×3): qty 1

## 2018-12-12 MED ORDER — TAMSULOSIN HCL 0.4 MG PO CAPS
0.4000 mg | ORAL_CAPSULE | Freq: Every day | ORAL | Status: DC
  Administered 2018-12-13 – 2018-12-15 (×3): 0.4 mg via ORAL
  Filled 2018-12-12 (×3): qty 1

## 2018-12-12 NOTE — ED Triage Notes (Signed)
Pt arrives following fall, unwitnessed unknown LOC  Oriented to person and time, Ox4 at baseline  CBG 56 on arrival, pt family states he was here on Wednesday for fall as well

## 2018-12-12 NOTE — ED Provider Notes (Signed)
[facility] EMERGENCY DEPARTMENT Provider Note   CSN: [id number] Arrival date & time: 12/01/2018  1709     History   Chief Complaint Chief Complaint  Patient presents with  . Fall  . Altered Mental Status    HPI Aaron Nelson is a 84 y.o. male.  The history is provided by the patient and a relative. A language interpreter was used.    Patient is a 84 year old male with a past medical history of DM, HTN, cardiac arrest status post [place] pacemaker placement who presents accompanied by his son for evaluation after concern for a fall that occurred immediately prior to presentation.  The patient reportedly lives at home and his son was with him in his home when the son saw him go to the bathroom and then heard a fall.  The patient was found down with a cut on the left side of his head.  Per the son the patient has also been more confused than his usual self as he is usually able to state the date, year, and usually knows where he is.  The patient denies any pain but is unable to specify any details regarding the circumstances of his fall or why he is in the hospital.  The patient is unable to specify his past medical history.  Past Medical History:  Diagnosis Date  . Diabetes mellitus without complication ([place])   . Hypertension   . Myelodysplasia (myelodysplastic syndrome) ([place])   . S/P placement of cardiac pacemaker 08/30/2017  . Sinus arrest   . Syncope   . Syncope and collapse 08/30/2017    Patient Active Problem List   Diagnosis Date Noted  . Fall 12/11/2018  . Dementia due to Alzheimer's disease ([place]) 12/17/2018  . Memory loss 08/17/2018  . Closed fracture of nasal bones   . Postural dizziness with presyncope   . Syncope 04/21/2018  . Benign prostatic hyperplasia with hesitancy 12/10/2017  . Hyperglycemia 12/10/2017  . Abnormal CT scan, kidney 10/15/2017  . Syncope and collapse 08/30/2017  . Sinus arrest 20 sec with syncope 08/30/2017  . S/P placement  of cardiac pacemaker 08/30/2017  . Sinus node dysfunction ([place]) 08/29/2017  . Myelofibrosis ([place]) 07/24/2017  . JAK2 [id number] mutation 07/24/2017  . Essential hypertension 07/15/2017  . Gait disturbance 07/15/2017  . Uncontrolled type 2 diabetes mellitus without complication, with long-term current use of insulin ([place]) 07/15/2017  . Leukopenia 07/03/2017  . Anemia 07/03/2017  . Thrombocytosis ([place]) 07/03/2017    Past Surgical History:  Procedure Laterality Date  . BONE MARROW BIOPSY  2017  . CATARACT EXTRACTION    . EYE SURGERY    . LOOP RECORDER REMOVAL N/A 08/29/2017   Procedure: LOOP RECORDER REMOVAL;  Surgeon: Deboraha Sprang, MD;  Location: [facility];  Service: Cardiovascular;  Laterality: N/A;  . PACEMAKER IMPLANT Left 08/29/2017   St Jude generator  . PACEMAKER IMPLANT N/A 08/29/2017   Procedure: Pacemaker Implant;  Surgeon: Deboraha Sprang, MD;  Location: [facility];  Service: Cardiovascular;  Laterality: N/A;  . PORTACATH PLACEMENT  2017        Home Medications    Prior to Admission medications   Medication Sig Start Date End Date Taking? Authorizing Provider  acetaminophen (TYLENOL) 325 MG tablet Take 2 tablets (650 mg total) by mouth every 4 (four) hours as needed for mild pain. 08/30/17  Yes Isaiah Serge, NP  amLODipine (NORVASC) 10 MG tablet TAKE 1 TABLET BY MOUTH DAILY  Patient taking differently: Take 10 mg by mouth daily.  12/11/17  Yes Deboraha Sprang, MD  aspirin EC 81 MG tablet Take 81 mg by mouth daily.   Yes [provider]  ferrous sulfate 325 (65 FE) MG EC tablet Take 1 tablet (325 mg total) by mouth 3 (three) times daily with meals. 11/06/18 12/01/2018 Yes Brunetta Genera, MD  Insulin Glargine (LANTUS SOLOSTAR) 100 UNIT/ML Solostar Pen Inject 40 Units into the skin every morning. Patient taking differently: Inject 40 Units into the skin daily before breakfast.  08/17/18  Yes Newlin, Enobong, MD  insulin regular (HUMULIN R) 100  units/mL injection Inject 0.1 mLs (10 Units total) into the skin 3 (three) times daily before meals. For Blood Sugar greater than 250 Patient taking differently: Inject 10 Units into the skin See admin instructions. Inject 10 units into the skin three times a day before meals for BGL greater than 250 12/10/17  Yes Jegede, Olugbemiga E, MD  isosorbide mononitrate (IMDUR) 30 MG 24 hr tablet Take 30 mg by mouth daily. 09/17/18  Yes [provider]  JAKAFI 10 MG tablet TAKE 1 TABLET BY MOUTH TWICE DAILY Patient taking differently: Take 10 mg by mouth 2 (two) times daily.  12/08/18  Yes Brunetta Genera, MD  memantine (NAMENDA) 10 MG tablet Take 1 tablet (10 mg total) by mouth 2 (two) times daily. 08/17/18  Yes Newlin, Enobong, MD  NITROSTAT 0.4 MG SL tablet Place 1 tablet (0.4 mg total) under the tongue every 5 (five) minutes as needed for chest pain. 09/16/17 11/27/2018 Yes Belva Crome, MD  tamsulosin (FLOMAX) 0.4 MG CAPS capsule Take 1 capsule (0.4 mg total) by mouth daily. 08/17/18  Yes Charlott Rakes, MD  ACCU-CHEK FASTCLIX LANCETS MISC Use as directed 3 times daily. E11.9 11/20/17   Tresa Garter, MD  Blood Glucose Monitoring Suppl (ACCU-CHEK AVIVA PLUS) w/Device KIT Use as directed 3 times daily. E11.9 11/20/17   Angelica Chessman E, MD  glucose blood (ACCU-CHEK AVIVA PLUS) test strip Use as directed 3 times daily. E11.9 04/08/18   Argentina Donovan, PA-C  Insulin Pen Needle 31G X 5 MM MISC Use as directed 04/08/18   Argentina Donovan, PA-C    Family History Family History  Problem Relation Age of Onset  . Hypertension Mother     Social History Social History   Tobacco Use  . Smoking status: Never Smoker  . Smokeless tobacco: Never Used  Substance Use Topics  . Alcohol use: No  . Drug use: No     Allergies   Patient has no known allergies.   Review of Systems Review of Systems  Unable to perform ROS: Acuity of condition   Reassessment patient stated he did  not know why he had fallen but denies any recent fevers, chest pain, cough, shortness of breath, abdominal pain, nausea, vomiting, diarrhea, dysuria, or other acute complaints.  Physical Exam Updated Vital Signs BP (!) 172/92 (BP Location: Right Arm)   Pulse 75   Temp 98.4 F (36.9 C) (Oral)   Resp 16   Ht '5\' 6"'  (1.676 m)   Wt 66.2 kg   SpO2 100%   BMI 23.57 kg/m   Physical Exam Vitals signs and nursing note reviewed.  Constitutional:      Appearance: Normal appearance. He is normal weight.  HENT:     Head: Normocephalic.     Right Ear: External ear normal.     Nose: Nose normal.  Mouth/Throat:     Mouth: Mucous membranes are moist.     Pharynx: Oropharynx is clear.  Eyes:     Pupils: Pupils are equal, round, and reactive to light.  Cardiovascular:     Rate and Rhythm: Normal rate.     Pulses: Normal pulses.          Radial pulses are 2+ on the right side and 2+ on the left side.       Dorsalis pedis pulses are 2+ on the right side and 2+ on the left side.  Pulmonary:     Effort: Pulmonary effort is normal. No respiratory distress.  Abdominal:     General: There is no distension.     Tenderness: There is no abdominal tenderness.  Musculoskeletal:        General: No deformity.  Skin:    General: Skin is warm.     Capillary Refill: Capillary refill takes less than 2 seconds.  Neurological:     Mental Status: He is disoriented.    There is a laceration over the left earlobe as well as the left temple area.  These lacerations have no surrounding hematoma and have a slow ooze.  There is also a series of small lacerations over the patient's left upper quadrant of his abdomen.  Patient is unable to state the date or year where he is located on initial presentation.  ED Treatments / Results  Labs (all labs ordered are listed, but only abnormal results are displayed) Labs Reviewed  CBC WITH DIFFERENTIAL/PLATELET - Abnormal; Notable for the following components:       Result Value   WBC 19.6 (*)    RBC 4.15 (*)    Hemoglobin 9.9 (*)    HCT 35.1 (*)    MCH 23.9 (*)    MCHC 28.2 (*)    RDW 23.1 (*)    Platelets 50 (*)    nRBC 48.2 (*)    Lymphs Abs 4.3 (*)    Monocytes Absolute 2.2 (*)    Basophils Absolute 2.4 (*)    nRBC 69 (*)    Abs Immature Granulocytes 4.50 (*)    All other components within normal limits  COMPREHENSIVE METABOLIC PANEL - Abnormal; Notable for the following components:   Glucose, Bld 68 (*)    Creatinine, Ser 1.81 (*)    AST 64 (*)    Total Bilirubin 1.3 (*)    GFR calc non Af Amer 33 (*)    GFR calc Af Amer 39 (*)    All other components within normal limits  URINALYSIS, ROUTINE W REFLEX MICROSCOPIC - Abnormal; Notable for the following components:   Specific Gravity, Urine <1.005 (*)    Glucose, UA 100 (*)    Hgb urine dipstick TRACE (*)    All other components within normal limits  URINALYSIS, MICROSCOPIC (REFLEX) - Abnormal; Notable for the following components:   Bacteria, UA RARE (*)    All other components within normal limits  CBG MONITORING, ED - Abnormal; Notable for the following components:   Glucose-Capillary 65 (*)    All other components within normal limits  TROPONIN I  PROTIME-INR  APTT  GLUCOSE, CAPILLARY  PATHOLOGIST SMEAR REVIEW  CBC  COMPREHENSIVE METABOLIC PANEL  CBG MONITORING, ED  TYPE AND SCREEN  ABO/RH    EKG EKG Interpretation  Date/Time:  Saturday December 12 2018 17:22:13 EST Ventricular Rate:  78 PR Interval:    QRS Duration: 102 QT Interval:  440 QTC Calculation: 502  R Axis:   62 Text Interpretation:  Sinus rhythm Consider left atrial enlargement Nonspecific repol abnormality, inferior leads Prolonged QT interval Baseline wander in lead(s) II III aVF V3 Abnormal ekg Confirmed by Carmin Muskrat [phone]) on 12/01/2018 6:36:58 PM   Radiology Ct Abdomen Pelvis Wo Contrast  Result Date: 12/16/2018 CLINICAL DATA:  Unwitnessed fall. Renal failure. EXAM: CT CHEST, ABDOMEN  AND PELVIS WITHOUT CONTRAST TECHNIQUE: Multidetector CT imaging of the chest, abdomen and pelvis was performed following the standard protocol without IV contrast. COMPARISON:  Chest radiograph 12/11/2018. Pelvic radiograph 11/28/2018. Prior chest CT 12/02/2018. FINDINGS: CT CHEST FINDINGS Cardiovascular: Dual lead pacer. Aortic atherosclerosis. Tortuous thoracic aorta. Normal heart size, without pericardial effusion. Lad coronary artery calcification. Mediastinum/Nodes: No mediastinal or definite hilar adenopathy, given limitations of unenhanced CT. Lungs/Pleura: No pleural fluid or pneumothorax. Right lower lobe calcified granuloma on image 85/4. Minimal left apical pleuroparenchymal scarring. Musculoskeletal: Moderate thoracic spondylosis. Ninth posterolateral left rib fracture is likely subacute, minimally displaced on image 52/3. CT ABDOMEN PELVIS FINDINGS Hepatobiliary: Mild degradation secondary to patient arm position, not raised above the head. Normal noncontrast appearance of the liver, gallbladder, biliary tract. Pancreas: Normal, without mass or ductal dilatation. Spleen: Atypical splenic morphology, without focal abnormality. Similar. Adrenals/Urinary Tract: Normal adrenal glands. Left kidney is ptotic. No renal calculi or hydronephrosis. Mild bladder wall irregularity. Stomach/Bowel: Normal stomach, without wall thickening. Large colonic stool burden, including a 5.9 cm stool ball in the rectum. Normal terminal ileum and appendix. Normal small bowel. Vascular/Lymphatic: Aortic and branch vessel atherosclerosis. No abdominopelvic adenopathy. Reproductive: Normal prostate. Other: No significant free fluid.  No free intraperitoneal air. Musculoskeletal: Suspect renal osteodystrophy, mild. Lumbosacral spondylosis IMPRESSION: 1. Ninth posterolateral left rib fracture is likely subacute. 2. Otherwise, no acute or posttraumatic deformity identified. 3. Coronary artery atherosclerosis. 4. Mild degradation  secondary to patient arm position. 5.  Aortic Atherosclerosis (ICD10-I70.0). 6. Possible constipation and fecal impaction. 7. Mild bladder wall irregularity could represent a component of outlet obstruction. Aortic Atherosclerosis (ICD10-I70.0). Electronically Signed   By: Abigail Miyamoto M.D.   On: 12/11/2018 19:26   Dg Chest 1 View  Result Date: 12/13/2018 CLINICAL DATA:  Trauma, unwitnessed fall EXAM: CHEST  1 VIEW COMPARISON:  12/02/2018, CT chest 12/02/2018 FINDINGS: Left-sided pacing device with similar appearance of intracardiac leads. No acute airspace disease or effusion. Normal heart size. Aortic atherosclerosis. No pneumothorax. IMPRESSION: No active disease. Electronically Signed   By: Donavan Foil M.D.   On: 12/02/2018 17:53   Ct Head Wo Contrast  Result Date: 11/26/2018 CLINICAL DATA:  Unwitnessed fall. Laceration to the back of the head. EXAM: CT HEAD WITHOUT CONTRAST CT CERVICAL SPINE WITHOUT CONTRAST TECHNIQUE: Multidetector CT imaging of the head and cervical spine was performed following the standard protocol without intravenous contrast. Multiplanar CT image reconstructions of the cervical spine were also generated. COMPARISON:  04/21/2018 FINDINGS: CT HEAD FINDINGS Brain: Generalized atrophy. Chronic small-vessel ischemic changes of the white matter. Old right parietal vertex cortical and subcortical infarction. No sign of acute infarction, mass lesion, hemorrhage, hydrocephalus or extra-axial collection. Vascular: There is atherosclerotic calcification of the major vessels at the base of the brain. Skull: No skull fracture. Sinuses/Orbits: Chronic mucoperiosteal thickening in the sphenoid sinus. Other: Left parieto-occipital scalp hematoma. CT CERVICAL SPINE FINDINGS Alignment: No traumatic malalignment. Skull base and vertebrae: No evidence of regional acute fracture. 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 Soft tissues and spinal canal: No soft tissue injury. Disc  levels: Chronic fusion at C2-3  and C4-5. Chronic degenerative facet arthropathy and spondylosis at C3-4, C5-6, C6-7 and C7-T1. Upper chest: Negative Other: None IMPRESSION: 1. Head CT: No acute or traumatic intracranial finding. Atrophy and chronic small-vessel ischemic changes. Left parieto-occipital scalp hematoma without underlying skull fracture. 2. Cervical spine CT: No acute traumatic finding. 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 Chronic degenerative changes as outlined above. Electronically Signed   By: Nelson Chimes M.D.   On: 12/02/2018 19:33   Ct Chest Wo Contrast  Result Date: 12/11/2018 CLINICAL DATA:  Unwitnessed fall. Renal failure. EXAM: CT CHEST, ABDOMEN AND PELVIS WITHOUT CONTRAST TECHNIQUE: Multidetector CT imaging of the chest, abdomen and pelvis was performed following the standard protocol without IV contrast. COMPARISON:  Chest radiograph 12/11/2018. Pelvic radiograph 12/08/2018. Prior chest CT 12/02/2018. FINDINGS: CT CHEST FINDINGS Cardiovascular: Dual lead pacer. Aortic atherosclerosis. Tortuous thoracic aorta. Normal heart size, without pericardial effusion. Lad coronary artery calcification. Mediastinum/Nodes: No mediastinal or definite hilar adenopathy, given limitations of unenhanced CT. Lungs/Pleura: No pleural fluid or pneumothorax. Right lower lobe calcified granuloma on image 85/4. Minimal left apical pleuroparenchymal scarring. Musculoskeletal: Moderate thoracic spondylosis. Ninth posterolateral left rib fracture is likely subacute, minimally displaced on image 52/3. CT ABDOMEN PELVIS FINDINGS Hepatobiliary: Mild degradation secondary to patient arm position, not raised above the head. Normal noncontrast appearance of the liver, gallbladder, biliary tract. Pancreas: Normal, without mass or ductal dilatation. Spleen: Atypical splenic morphology, without focal abnormality. Similar. Adrenals/Urinary Tract: Normal adrenal glands. Left kidney is ptotic. No renal  calculi or hydronephrosis. Mild bladder wall irregularity. Stomach/Bowel: Normal stomach, without wall thickening. Large colonic stool burden, including a 5.9 cm stool ball in the rectum. Normal terminal ileum and appendix. Normal small bowel. Vascular/Lymphatic: Aortic and branch vessel atherosclerosis. No abdominopelvic adenopathy. Reproductive: Normal prostate. Other: No significant free fluid.  No free intraperitoneal air. Musculoskeletal: Suspect renal osteodystrophy, mild. Lumbosacral spondylosis IMPRESSION: 1. Ninth posterolateral left rib fracture is likely subacute. 2. Otherwise, no acute or posttraumatic deformity identified. 3. Coronary artery atherosclerosis. 4. Mild degradation secondary to patient arm position. 5.  Aortic Atherosclerosis (ICD10-I70.0). 6. Possible constipation and fecal impaction. 7. Mild bladder wall irregularity could represent a component of outlet obstruction. Aortic Atherosclerosis (ICD10-I70.0). Electronically Signed   By: Abigail Miyamoto M.D.   On: 11/25/2018 19:26   Ct Cervical Spine Wo Contrast  Result Date: 12/21/2018 CLINICAL DATA:  Unwitnessed fall. Laceration to the back of the head. EXAM: CT HEAD WITHOUT CONTRAST CT CERVICAL SPINE WITHOUT CONTRAST TECHNIQUE: Multidetector CT imaging of the head and cervical spine was performed following the standard protocol without intravenous contrast. Multiplanar CT image reconstructions of the cervical spine were also generated. COMPARISON:  04/21/2018 FINDINGS: CT HEAD FINDINGS Brain: Generalized atrophy. Chronic small-vessel ischemic changes of the white matter. Old right parietal vertex cortical and subcortical infarction. No sign of acute infarction, mass lesion, hemorrhage, hydrocephalus or extra-axial collection. Vascular: There is atherosclerotic calcification of the major vessels at the base of the brain. Skull: No skull fracture. Sinuses/Orbits: Chronic mucoperiosteal thickening in the sphenoid sinus. Other: Left  parieto-occipital scalp hematoma. CT CERVICAL SPINE FINDINGS Alignment: No traumatic malalignment. Skull base and vertebrae: No evidence of regional acute fracture. 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 Soft tissues and spinal canal: No soft tissue injury. Disc levels: Chronic fusion at C2-3 and C4-5. Chronic degenerative facet arthropathy and spondylosis at C3-4, C5-6, C6-7 and C7-T1. Upper chest: Negative Other: None IMPRESSION: 1. Head CT: No acute or traumatic intracranial finding. Atrophy and  chronic small-vessel ischemic changes. Left parieto-occipital scalp hematoma without underlying skull fracture. 2. Cervical spine CT: No acute traumatic finding. 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 Chronic degenerative changes as outlined above. Electronically Signed   By: Nelson Chimes M.D.   On: 12/13/2018 19:33   Dg Pelvis Portable  Result Date: 12/13/2018 CLINICAL DATA:  Unwitnessed fall EXAM: PORTABLE PELVIS 1-2 VIEWS COMPARISON:  None. FINDINGS: SI joints are non widened. Pubic symphysis and rami are intact. No fracture or malalignment. IMPRESSION: No acute osseous abnormality Electronically Signed   By: Donavan Foil M.D.   On: 12/15/2018 17:53    Procedures .Marland KitchenLaceration Repair Date/Time: 12/14/2018 11:19 PM Performed by: Hulan Saas, MD Authorized by: Hulan Saas, MD   Consent:    Consent obtained:  Verbal   Consent given by:  Patient   Risks discussed:  Poor wound healing   Alternatives discussed:  No treatment Laceration details:    Location:  Scalp   Scalp location:  L temporal   Length (cm):  4 Repair type:    Repair type:  Simple Exploration:    Wound exploration: wound explored through full range of motion     Contaminated: no   Treatment:    Area cleansed with:  Saline   Amount of cleaning:  Standard   Irrigation solution:  Sterile saline Skin repair:    Repair method:  Staples   Number of staples:  3 Approximation:     Approximation:  Close Post-procedure details:    Dressing:  Bulky dressing   Patient tolerance of procedure:  Tolerated well, no immediate complications   (including critical care time)  Medications Ordered in ED Medications  aspirin EC tablet 81 mg (has no administration in time range)  acetaminophen (TYLENOL) tablet 650 mg (has no administration in time range)  nitroGLYCERIN (NITROSTAT) SL tablet 0.4 mg (has no administration in time range)  amLODipine (NORVASC) tablet 10 mg (has no administration in time range)  insulin glargine (LANTUS) injection 40 Units (has no administration in time range)  tamsulosin (FLOMAX) capsule 0.4 mg (has no administration in time range)  memantine (NAMENDA) tablet 10 mg (has no administration in time range)  ferrous sulfate tablet 325 mg (has no administration in time range)  isosorbide mononitrate (IMDUR) 24 hr tablet 30 mg (has no administration in time range)  ruxolitinib phosphate (JAKAFI) tablet 10 mg (has no administration in time range)  insulin aspart (novoLOG) injection 0-9 Units (has no administration in time range)  insulin aspart (novoLOG) injection 0-5 Units (has no administration in time range)  heparin injection 5,000 Units (has no administration in time range)  0.9 %  sodium chloride infusion (has no administration in time range)  ondansetron (ZOFRAN) tablet 4 mg (has no administration in time range)    Or  ondansetron (ZOFRAN) injection 4 mg (has no administration in time range)  dextrose (D10W) 10% bolus 500 mL (0 mLs Intravenous Stopped 11/22/2018 2128)     Initial Impression / Assessment and Plan / ED Course  I have reviewed the triage vital signs and the nursing notes.  Pertinent labs & imaging results that were available during my care of the patient were reviewed by me and considered in my medical decision making (see chart for details).     Patient is an 84 year old male who presents above-stated history exam.  On  presentation patient is confused but does follow commands.  Exam as above remarkable for laceration of the left temple area, left ear,  and left upper quadrant of the abdomen.  No other obvious signs of trauma on exam.  ABCs otherwise intact as above. Given age, history, and initial confusion on presentation trauma scans were obtained.  CT Head and C Spine Head CT: No acute or traumatic intracranial finding. Atrophy and chronic small-vessel ischemic changes. Left parieto-occipital scalp hematoma without underlying skull fracture. Cervical spine CT: No acute traumatic finding. 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 Chronic degenerative changes as outlined above. CT C/A/P Ninth posterolateral left rib fracture is likely subacute.  Otherwise, no acute or posttraumatic deformity identified. Coronary artery atherosclerosis. Mild degradation secondary to patient arm position. Aortic Atherosclerosis (ICD10-I70.0).  Possible constipation and fecal impaction. Mild bladder wall irregularity could represent a component of outlet obstruction.  Doubt acute cardiac ischemic event given ECG shows NSR with a ventricular rate of 78 with a QTC of 502 and no other overt signs of acute ischemic change and initial troponin is undetectable.  CMP shows no significant electrolyte abnormalities with creatinine consistent with prior levels.  CBC shows a leukocytosis of 19.6 with a hemoglobin 9.9 that is stable compared to prior levels as well as platelets of 50 that is also stable compared to prior levels this year.  On reassessment I attempted to ambulate the patient patient stated he felt extremely dizzy while ambulating.  Given concern for unexplained syncope and persistent dizziness with ambulation and patient was admitted for evaluation.   Final Clinical Impressions(s) / ED Diagnoses   Final diagnoses:  Fall, initial encounter  Laceration of scalp, initial encounter    ED Discharge Orders     None       Hulan Saas, MD 11/28/2018 3818    Carmin Muskrat, MD 12/13/18 1521

## 2018-12-12 NOTE — H&P (Signed)
History and Physical   Aaron Nelson TIR:[id number] DOB: [birth year] DOA: 12/11/2018  Referring MD/NP/PA: Dr. Melina Copa  PCP: Charlott Rakes, MD   Outpatient Specialists: On-call  Patient coming from: Home  Chief Complaint: Dizziness and altered mental status with fall  HPI: Aaron Nelson is a 84 y.o. male with medical history significant of recurrent syncope and presyncope, sick sinus syndrome status post pacemaker placement, myelodysplasia, hypertension, diabetes, early dementia who lives with his son at home.  Patient was recently admitted to the hospital and discharged due to recurrent fall.  He was apparently at home with his son when patient forgot to use his walker and walked towards the bathroom.  He went in the fell backward hitting the back of his head on the floor.  He has an open laceration which was addressed in the ER.  Patient is on reported that he has had this recurrent dizziness episodes.  Patient has no understanding as to what brings them on.  EMS showed no significant drop in blood sugar.  Blood pressure was very much elevated systolic over 676 when they arrived the ER.  Patient was mobilized in the ER but was unsteady and unable to stand on his feet.  There is worried that this is due to significant arrhythmia with his extensive cardiac history.  He is therefore being admitted for further evaluation.  ED Course: Temperature is 98 for blood pressure 209/83 pulse is 80 respiratory rate of 16 oxygen sat 100% on room air.  White count is 19.6 hemoglobin 9.9 and platelets of 50.  Sodium 138 potassium 4 chloride 105 CO2 23 with creatinine 1.81 and glucose of 68.  Urinalysis essentially negative.  Chest x-ray also negative.  X-ray of the pelvis was also negative.  CT cervical spine as well as head and chest were all within normal except for subacute fracture of the left ninth rib.  Review of Systems: As per HPI otherwise 10 point review of systems negative.    Past Medical History:    Diagnosis Date  . Diabetes mellitus without complication ([place])   . Hypertension   . Myelodysplasia (myelodysplastic syndrome) ([place])   . S/P placement of cardiac pacemaker 08/30/2017  . Sinus arrest   . Syncope   . Syncope and collapse 08/30/2017    Past Surgical History:  Procedure Laterality Date  . BONE MARROW BIOPSY  2017  . CATARACT EXTRACTION    . EYE SURGERY    . LOOP RECORDER REMOVAL N/A 08/29/2017   Procedure: LOOP RECORDER REMOVAL;  Surgeon: Deboraha Sprang, MD;  Location: [facility];  Service: Cardiovascular;  Laterality: N/A;  . PACEMAKER IMPLANT Left 08/29/2017   St Jude generator  . PACEMAKER IMPLANT N/A 08/29/2017   Procedure: Pacemaker Implant;  Surgeon: Deboraha Sprang, MD;  Location: [facility];  Service: Cardiovascular;  Laterality: N/A;  . PORTACATH PLACEMENT  2017     reports that he has never smoked. He has never used smokeless tobacco. He reports that he does not drink alcohol or use drugs.  No Known Allergies  Family History  Problem Relation Age of Onset  . Hypertension Mother      Prior to Admission medications   Medication Sig Start Date End Date Taking? Authorizing Provider  acetaminophen (TYLENOL) 325 MG tablet Take 2 tablets (650 mg total) by mouth every 4 (four) hours as needed for mild pain. 08/30/17  Yes Isaiah Serge, NP  amLODipine (NORVASC) 10 MG tablet TAKE 1 TABLET BY MOUTH  DAILY Patient taking differently: Take 10 mg by mouth daily.  12/11/17  Yes Deboraha Sprang, MD  aspirin EC 81 MG tablet Take 81 mg by mouth daily.   Yes [provider]  ferrous sulfate 325 (65 FE) MG EC tablet Take 1 tablet (325 mg total) by mouth 3 (three) times daily with meals. 11/06/18 12/13/2018 Yes Brunetta Genera, MD  Insulin Glargine (LANTUS SOLOSTAR) 100 UNIT/ML Solostar Pen Inject 40 Units into the skin every morning. Patient taking differently: Inject 40 Units into the skin daily before breakfast.  08/17/18  Yes Newlin, Enobong, MD   insulin regular (HUMULIN R) 100 units/mL injection Inject 0.1 mLs (10 Units total) into the skin 3 (three) times daily before meals. For Blood Sugar greater than 250 Patient taking differently: Inject 10 Units into the skin See admin instructions. Inject 10 units into the skin three times a day before meals for BGL greater than 250 12/10/17  Yes Jegede, Olugbemiga E, MD  isosorbide mononitrate (IMDUR) 30 MG 24 hr tablet Take 30 mg by mouth daily. 09/17/18  Yes [provider]  JAKAFI 10 MG tablet TAKE 1 TABLET BY MOUTH TWICE DAILY Patient taking differently: Take 10 mg by mouth 2 (two) times daily.  12/08/18  Yes Brunetta Genera, MD  memantine (NAMENDA) 10 MG tablet Take 1 tablet (10 mg total) by mouth 2 (two) times daily. 08/17/18  Yes Newlin, Enobong, MD  NITROSTAT 0.4 MG SL tablet Place 1 tablet (0.4 mg total) under the tongue every 5 (five) minutes as needed for chest pain. 09/16/17 11/29/2018 Yes Belva Crome, MD  tamsulosin (FLOMAX) 0.4 MG CAPS capsule Take 1 capsule (0.4 mg total) by mouth daily. 08/17/18  Yes Charlott Rakes, MD  ACCU-CHEK FASTCLIX LANCETS MISC Use as directed 3 times daily. E11.9 11/20/17   Tresa Garter, MD  Blood Glucose Monitoring Suppl (ACCU-CHEK AVIVA PLUS) w/Device KIT Use as directed 3 times daily. E11.9 11/20/17   Angelica Chessman E, MD  glucose blood (ACCU-CHEK AVIVA PLUS) test strip Use as directed 3 times daily. E11.9 04/08/18   Argentina Donovan, PA-C  Insulin Pen Needle 31G X 5 MM MISC Use as directed 04/08/18   Argentina Donovan, [place]    Physical Exam: Vitals:   12/20/2018 1745 11/29/2018 1800 11/26/2018 1806 12/07/2018 2130  BP: (!) 164/89 (!) 209/83  (!) 152/91  Pulse: 67 60  80  Resp: 12 11    Temp:      TempSrc:      SpO2: 100% 100%  100%  Weight:   66.2 kg   Height:   _0  (1.676 m)       Constitutional: NAD, calm, comfortable, confused and mildly dysarthric Vitals:   12/07/2018 1745 12/06/2018 1800 11/24/2018 1806 12/22/2018 2130  BP:  (!) 164/89 (!) 209/83  (!) 152/91  Pulse: 67 60  80  Resp: 12 11    Temp:      TempSrc:      SpO2: 100% 100%  100%  Weight:   66.2 kg   Height:   _1  (1.676 m)    Eyes: PERRL, lids and conjunctivae normal ENMT: Mucous membranes are moist. Posterior pharynx clear of any exudate or lesions.Normal dentition.  Neck: normal, supple, no masses, no thyromegaly Respiratory: clear to auscultation bilaterally, no wheezing, no crackles. Normal respiratory effort. No accessory muscle use.  Cardiovascular: Regular rate and rhythm, no murmurs / rubs / gallops. No extremity edema. 2+ pedal pulses. No carotid bruits.  Abdomen: no tenderness, no masses palpated. No hepatosplenomegaly. Bowel sounds positive.  Musculoskeletal: no clubbing / cyanosis. No joint deformity upper and lower extremities. Good ROM, no contractures. Normal muscle tone.  Skin: no rashes, lesions, ulcers. No induration Neurologic: Head dressed with open wound at the back of the head, CN 2-12 grossly intact. Sensation intact, DTR normal. Strength 5/5 in all 4.  Psychiatric: Patient is confused not able to give adequate history, poor judgment.     Labs on Admission: I have personally reviewed following labs and imaging studies  CBC: Recent Labs  Lab 12/10/18 1529 12/02/2018 1735  WBC 12.9* 19.6*  NEUTROABS 2.6 4.3  HGB 9.6* 9.9*  HCT 33.0* 35.1*  MCV 85.5 84.6  PLT 46* 50*   Basic Metabolic Panel: Recent Labs  Lab 12/10/18 1529 12/06/2018 1735  NA 140 138  K 4.9 4.5  CL 106 105  CO2 23 23  GLUCOSE 211* 68*  BUN 24* 18  CREATININE 1.84* 1.81*  CALCIUM 9.0 9.3   GFR: Estimated Creatinine Clearance: 26.9 mL/min (A) (by C-G formula based on SCr of 1.81 mg/dL (H)). Liver Function Tests: Recent Labs  Lab 12/10/18 1529 12/15/2018 1735  AST 56* 64*  ALT 27 28  ALKPHOS 94 101  BILITOT 1.6* 1.3*  PROT 7.0 7.8  ALBUMIN 3.9 4.0   No results for input(s): LIPASE, AMYLASE in the last 168 hours. No results for  input(s): AMMONIA in the last 168 hours. Coagulation Profile: Recent Labs  Lab 12/10/2018 1735  INR 1.16   Cardiac Enzymes: Recent Labs  Lab 12/11/2018 1735  TROPONINI <0.03   BNP (last 3 results) No results for input(s): PROBNP in the last 8760 hours. HbA1C: No results for input(s): HGBA1C in the last 72 hours. CBG: Recent Labs  Lab 12/11/2018 1724  GLUCAP 65*   Lipid Profile: No results for input(s): CHOL, HDL, LDLCALC, TRIG, CHOLHDL, LDLDIRECT in the last 72 hours. Thyroid Function Tests: No results for input(s): TSH, T4TOTAL, FREET4, T3FREE, THYROIDAB in the last 72 hours. Anemia Panel: Recent Labs    12/10/18 1528  FERRITIN 465*  TIBC 300  IRON 72   Urine analysis:    Component Value Date/Time   COLORURINE YELLOW 12/09/2018 [place] 12/06/2018 1735   APPEARANCEUR Clear 06/11/2017 1232   LABSPEC <1.005 (L) 12/13/2018 1735   PHURINE 6.5 11/23/2018 1735   GLUCOSEU 100 (A) 11/24/2018 1735   HGBUR TRACE (A) 12/01/2018 1735   BILIRUBINUR NEGATIVE 12/08/2018 1735   BILIRUBINUR Negative 04/08/2018 0933   BILIRUBINUR Negative 06/11/2017 1232   KETONESUR NEGATIVE 12/04/2018 1735   PROTEINUR NEGATIVE 12/11/2018 1735   UROBILINOGEN 1.0 04/08/2018 0933   NITRITE NEGATIVE 11/24/2018 1735   LEUKOCYTESUR NEGATIVE 12/07/2018 1735   LEUKOCYTESUR Negative 06/11/2017 1232   Sepsis Labs: _0 (procalcitonin:4,lacticidven:4) )No results found for this or any previous visit (from the past 240 hour(s)).   Radiological Exams on Admission: Ct Abdomen Pelvis Wo Contrast  Result Date: 11/30/2018 CLINICAL DATA:  Unwitnessed fall. Renal failure. EXAM: CT CHEST, ABDOMEN AND PELVIS WITHOUT CONTRAST TECHNIQUE: Multidetector CT imaging of the chest, abdomen and pelvis was performed following the standard protocol without IV contrast. COMPARISON:  Chest radiograph 12/11/2018. Pelvic radiograph 11/26/2018. Prior chest CT 12/02/2018. FINDINGS: CT CHEST FINDINGS  Cardiovascular: Dual lead pacer. Aortic atherosclerosis. Tortuous thoracic aorta. Normal heart size, without pericardial effusion. Lad coronary artery calcification. Mediastinum/Nodes: No mediastinal or definite hilar adenopathy, given limitations of unenhanced CT. Lungs/Pleura: No pleural fluid or pneumothorax. Right lower lobe calcified  granuloma on image 85/4. Minimal left apical pleuroparenchymal scarring. Musculoskeletal: Moderate thoracic spondylosis. Ninth posterolateral left rib fracture is likely subacute, minimally displaced on image 52/3. CT ABDOMEN PELVIS FINDINGS Hepatobiliary: Mild degradation secondary to patient arm position, not raised above the head. Normal noncontrast appearance of the liver, gallbladder, biliary tract. Pancreas: Normal, without mass or ductal dilatation. Spleen: Atypical splenic morphology, without focal abnormality. Similar. Adrenals/Urinary Tract: Normal adrenal glands. Left kidney is ptotic. No renal calculi or hydronephrosis. Mild bladder wall irregularity. Stomach/Bowel: Normal stomach, without wall thickening. Large colonic stool burden, including a 5.9 cm stool ball in the rectum. Normal terminal ileum and appendix. Normal small bowel. Vascular/Lymphatic: Aortic and branch vessel atherosclerosis. No abdominopelvic adenopathy. Reproductive: Normal prostate. Other: No significant free fluid.  No free intraperitoneal air. Musculoskeletal: Suspect renal osteodystrophy, mild. Lumbosacral spondylosis IMPRESSION: 1. Ninth posterolateral left rib fracture is likely subacute. 2. Otherwise, no acute or posttraumatic deformity identified. 3. Coronary artery atherosclerosis. 4. Mild degradation secondary to patient arm position. 5.  Aortic Atherosclerosis (ICD10-I70.0). 6. Possible constipation and fecal impaction. 7. Mild bladder wall irregularity could represent a component of outlet obstruction. Aortic Atherosclerosis (ICD10-I70.0). Electronically Signed   By: Abigail Miyamoto M.D.    On: 11/23/2018 19:26   Dg Chest 1 View  Result Date: 11/23/2018 CLINICAL DATA:  Trauma, unwitnessed fall EXAM: CHEST  1 VIEW COMPARISON:  12/02/2018, CT chest 12/02/2018 FINDINGS: Left-sided pacing device with similar appearance of intracardiac leads. No acute airspace disease or effusion. Normal heart size. Aortic atherosclerosis. No pneumothorax. IMPRESSION: No active disease. Electronically Signed   By: Donavan Foil M.D.   On: 12/09/2018 17:53   Ct Head Wo Contrast  Result Date: 12/11/2018 CLINICAL DATA:  Unwitnessed fall. Laceration to the back of the head. EXAM: CT HEAD WITHOUT CONTRAST CT CERVICAL SPINE WITHOUT CONTRAST TECHNIQUE: Multidetector CT imaging of the head and cervical spine was performed following the standard protocol without intravenous contrast. Multiplanar CT image reconstructions of the cervical spine were also generated. COMPARISON:  04/21/2018 FINDINGS: CT HEAD FINDINGS Brain: Generalized atrophy. Chronic small-vessel ischemic changes of the white matter. Old right parietal vertex cortical and subcortical infarction. No sign of acute infarction, mass lesion, hemorrhage, hydrocephalus or extra-axial collection. Vascular: There is atherosclerotic calcification of the major vessels at the base of the brain. Skull: No skull fracture. Sinuses/Orbits: Chronic mucoperiosteal thickening in the sphenoid sinus. Other: Left parieto-occipital scalp hematoma. CT CERVICAL SPINE FINDINGS Alignment: No traumatic malalignment. Skull base and vertebrae: No evidence of regional acute fracture. 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 Soft tissues and spinal canal: No soft tissue injury. Disc levels: Chronic fusion at C2-3 and C4-5. Chronic degenerative facet arthropathy and spondylosis at C3-4, C5-6, C6-7 and C7-T1. Upper chest: Negative Other: None IMPRESSION: 1. Head CT: No acute or traumatic intracranial finding. Atrophy and chronic small-vessel ischemic changes. Left  parieto-occipital scalp hematoma without underlying skull fracture. 2. Cervical spine CT: No acute traumatic finding. 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 Chronic degenerative changes as outlined above. Electronically Signed   By: Nelson Chimes M.D.   On: 12/10/2018 19:33   Ct Chest Wo Contrast  Result Date: 11/29/2018 CLINICAL DATA:  Unwitnessed fall. Renal failure. EXAM: CT CHEST, ABDOMEN AND PELVIS WITHOUT CONTRAST TECHNIQUE: Multidetector CT imaging of the chest, abdomen and pelvis was performed following the standard protocol without IV contrast. COMPARISON:  Chest radiograph 12/11/2018. Pelvic radiograph 11/24/2018. Prior chest CT 12/02/2018. FINDINGS: CT CHEST FINDINGS Cardiovascular: Dual lead pacer. Aortic  atherosclerosis. Tortuous thoracic aorta. Normal heart size, without pericardial effusion. Lad coronary artery calcification. Mediastinum/Nodes: No mediastinal or definite hilar adenopathy, given limitations of unenhanced CT. Lungs/Pleura: No pleural fluid or pneumothorax. Right lower lobe calcified granuloma on image 85/4. Minimal left apical pleuroparenchymal scarring. Musculoskeletal: Moderate thoracic spondylosis. Ninth posterolateral left rib fracture is likely subacute, minimally displaced on image 52/3. CT ABDOMEN PELVIS FINDINGS Hepatobiliary: Mild degradation secondary to patient arm position, not raised above the head. Normal noncontrast appearance of the liver, gallbladder, biliary tract. Pancreas: Normal, without mass or ductal dilatation. Spleen: Atypical splenic morphology, without focal abnormality. Similar. Adrenals/Urinary Tract: Normal adrenal glands. Left kidney is ptotic. No renal calculi or hydronephrosis. Mild bladder wall irregularity. Stomach/Bowel: Normal stomach, without wall thickening. Large colonic stool burden, including a 5.9 cm stool ball in the rectum. Normal terminal ileum and appendix. Normal small bowel. Vascular/Lymphatic: Aortic and branch  vessel atherosclerosis. No abdominopelvic adenopathy. Reproductive: Normal prostate. Other: No significant free fluid.  No free intraperitoneal air. Musculoskeletal: Suspect renal osteodystrophy, mild. Lumbosacral spondylosis IMPRESSION: 1. Ninth posterolateral left rib fracture is likely subacute. 2. Otherwise, no acute or posttraumatic deformity identified. 3. Coronary artery atherosclerosis. 4. Mild degradation secondary to patient arm position. 5.  Aortic Atherosclerosis (ICD10-I70.0). 6. Possible constipation and fecal impaction. 7. Mild bladder wall irregularity could represent a component of outlet obstruction. Aortic Atherosclerosis (ICD10-I70.0). Electronically Signed   By: Abigail Miyamoto M.D.   On: 12/17/2018 19:26   Ct Cervical Spine Wo Contrast  Result Date: 12/17/2018 CLINICAL DATA:  Unwitnessed fall. Laceration to the back of the head. EXAM: CT HEAD WITHOUT CONTRAST CT CERVICAL SPINE WITHOUT CONTRAST TECHNIQUE: Multidetector CT imaging of the head and cervical spine was performed following the standard protocol without intravenous contrast. Multiplanar CT image reconstructions of the cervical spine were also generated. COMPARISON:  04/21/2018 FINDINGS: CT HEAD FINDINGS Brain: Generalized atrophy. Chronic small-vessel ischemic changes of the white matter. Old right parietal vertex cortical and subcortical infarction. No sign of acute infarction, mass lesion, hemorrhage, hydrocephalus or extra-axial collection. Vascular: There is atherosclerotic calcification of the major vessels at the base of the brain. Skull: No skull fracture. Sinuses/Orbits: Chronic mucoperiosteal thickening in the sphenoid sinus. Other: Left parieto-occipital scalp hematoma. CT CERVICAL SPINE FINDINGS Alignment: No traumatic malalignment. Skull base and vertebrae: No evidence of regional acute fracture. 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 Soft tissues and spinal canal: No soft tissue injury. Disc levels:  Chronic fusion at C2-3 and C4-5. Chronic degenerative facet arthropathy and spondylosis at C3-4, C5-6, C6-7 and C7-T1. Upper chest: Negative Other: None IMPRESSION: 1. Head CT: No acute or traumatic intracranial finding. Atrophy and chronic small-vessel ischemic changes. Left parieto-occipital scalp hematoma without underlying skull fracture. 2. Cervical spine CT: No acute traumatic finding. 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 Chronic degenerative changes as outlined above. Electronically Signed   By: Nelson Chimes M.D.   On: 12/08/2018 19:33   Dg Pelvis Portable  Result Date: 12/15/2018 CLINICAL DATA:  Unwitnessed fall EXAM: PORTABLE PELVIS 1-2 VIEWS COMPARISON:  None. FINDINGS: SI joints are non widened. Pubic symphysis and rami are intact. No fracture or malalignment. IMPRESSION: No acute osseous abnormality Electronically Signed   By: Donavan Foil M.D.   On: 12/17/2018 17:53    EKG: Independently reviewed.  EKG showed normal sinus rhythm with prolonged QT interval.  Assessment/Plan Principal Problem:   Postural dizziness with presyncope Active Problems:   Essential hypertension   Uncontrolled type 2 diabetes mellitus  without complication, with long-term current use of insulin (HCC)   Myelofibrosis ([place])   Sinus node dysfunction (HCC)   Syncope and collapse   S/P placement of cardiac pacemaker   Fall   Dementia due to Alzheimer's disease ([place])     #1 recurrent falls with presyncope: Most likely related to combination of sick sinus with hypoglycemic episodes.  Patient lives with his son but no additional help.  He will need 24-hour care with persistent physical therapy and Occupational Therapy.  His dementia may be also contributing to the fall.  Patient will be admitted to the hospital.  We will watch him off and place him on telemetry.  PT OT and RN consultation.  Most likely will need home health or skilled nursing facility.  #2 diabetes: patient's blood sugar in  the ER is 68.  He may be having hypoglycemic episodes.  We will monitor his couple of blood glucose in the hospital closely.  Adjust his home regimen of diabetic treatment.  #3 essential hypertension: Blood pressure is markedly elevated.  Resume home regimen and adjust as necessary.  #4 dementia: Continue with home regimen of dementia medications.  #5 history of myelofibrosis: He is being followed by oncology.  Continue according to oncology.  #6 mild head injury: Supportive care and dressing.  #7 BPH: Continue with Flomax   DVT prophylaxis: Heparin Code Status: Full code Family Communication: Son at bedside Disposition Plan: To be determined Consults called: None Admission status: Inpatient  Severity of Illness: The appropriate patient status for this patient is INPATIENT. Inpatient status is judged to be reasonable and necessary in order to provide the required intensity of service to ensure the patient's safety. The patient's presenting symptoms, physical exam findings, and initial radiographic and laboratory data in the context of their chronic comorbidities is felt to place them at high risk for further clinical deterioration. Furthermore, it is not anticipated that the patient will be medically stable for discharge from the hospital within 2 midnights of admission. The following factors support the patient status of inpatient.   " The patient's presenting symptoms include recurrent falls. " The worrisome physical exam findings include confusion with head injury. " The initial radiographic and laboratory data are worrisome because of no significant new findings. " The chronic co-morbidities include dementia with recurrent falls and syncope.   * I certify that at the point of admission it is my clinical judgment that the patient will require inpatient hospital care spanning beyond 2 midnights from the point of admission due to high intensity of service, high risk for further  deterioration and high frequency of surveillance required.Barbette Merino MD [facility] Pager [phone]  If 7PM-7AM, please contact night-coverage [email or web address] Password [email or web address]  11/28/2018, 10:11 PM

## 2018-12-12 NOTE — ED Notes (Signed)
Pt ambulated in hallway and walked with out assistance holding on to wall rail. Pts head cleaned up and wrapped also bed changed.

## 2018-12-12 NOTE — ED Triage Notes (Signed)
Laceration to L side of head, ear, and abrasion on belly

## 2018-12-13 ENCOUNTER — Encounter (HOSPITAL_COMMUNITY): Payer: Self-pay | Admitting: *Deleted

## 2018-12-13 DIAGNOSIS — Z794 Long term (current) use of insulin: Secondary | ICD-10-CM

## 2018-12-13 DIAGNOSIS — E1165 Type 2 diabetes mellitus with hyperglycemia: Secondary | ICD-10-CM

## 2018-12-13 DIAGNOSIS — Z950 Presence of cardiac pacemaker: Secondary | ICD-10-CM

## 2018-12-13 DIAGNOSIS — I495 Sick sinus syndrome: Secondary | ICD-10-CM

## 2018-12-13 LAB — COMPREHENSIVE METABOLIC PANEL
ALT: 25 U/L (ref 0–44)
AST: 57 U/L — ABNORMAL HIGH (ref 15–41)
Albumin: 3.5 g/dL (ref 3.5–5.0)
Alkaline Phosphatase: 87 U/L (ref 38–126)
Anion gap: 8 (ref 5–15)
BUN: 13 mg/dL (ref 8–23)
CO2: 24 mmol/L (ref 22–32)
Calcium: 8.9 mg/dL (ref 8.9–10.3)
Chloride: 108 mmol/L (ref 98–111)
Creatinine, Ser: 1.57 mg/dL — ABNORMAL HIGH (ref 0.61–1.24)
GFR calc Af Amer: 46 mL/min — ABNORMAL LOW (ref >60)
GFR, EST NON AFRICAN AMERICAN: 40 mL/min — AB (ref >60)
Glucose, Bld: 107 mg/dL — ABNORMAL HIGH (ref 70–99)
Potassium: 3.8 mmol/L (ref 3.5–5.1)
Sodium: 140 mmol/L (ref 135–145)
Total Bilirubin: 1.3 mg/dL — ABNORMAL HIGH (ref 0.3–1.2)
Total Protein: 7.1 g/dL (ref 6.5–8.1)

## 2018-12-13 LAB — GLUCOSE, CAPILLARY
GLUCOSE-CAPILLARY: 82 mg/dL (ref 70–99)
Glucose-Capillary: 70 mg/dL (ref 70–99)
Glucose-Capillary: 176 mg/dL — ABNORMAL HIGH (ref 70–99)
Glucose-Capillary: 193 mg/dL — ABNORMAL HIGH (ref 70–99)

## 2018-12-13 LAB — HEMOGLOBIN A1C
Hgb A1c MFr Bld: 7 % — ABNORMAL HIGH (ref 4.8–5.6)
Mean Plasma Glucose: 154.2 mg/dL

## 2018-12-13 LAB — CBC
HCT: 31.6 % — ABNORMAL LOW (ref 39.0–52.0)
Hemoglobin: 9.1 g/dL — ABNORMAL LOW (ref 13.0–17.0)
MCH: 24 pg — ABNORMAL LOW (ref 26.0–34.0)
MCHC: 28.8 g/dL — ABNORMAL LOW (ref 30.0–36.0)
MCV: 83.4 fL (ref 80.0–100.0)
Platelets: 44 10*3/uL — ABNORMAL LOW (ref 150–400)
RBC: 3.79 MIL/uL — ABNORMAL LOW (ref 4.22–5.81)
RDW: 22.7 % — ABNORMAL HIGH (ref 11.5–15.5)
WBC: 13.6 10*3/uL — ABNORMAL HIGH (ref 4.0–10.5)
nRBC: 50.2 % — ABNORMAL HIGH (ref 0.0–0.2)

## 2018-12-13 MED ORDER — INSULIN GLARGINE 100 UNIT/ML ~~LOC~~ SOLN
8.0000 [IU] | SUBCUTANEOUS | Status: DC
  Administered 2018-12-14: 8 [IU] via SUBCUTANEOUS
  Filled 2018-12-13: qty 0.08

## 2018-12-13 MED ORDER — RUXOLITINIB PHOSPHATE 10 MG PO TABS
10.0000 mg | ORAL_TABLET | Freq: Two times a day (BID) | ORAL | Status: DC
  Administered 2018-12-13 – 2018-12-15 (×6): 10 mg via ORAL
  Filled 2018-12-13 (×7): qty 1

## 2018-12-13 MED ORDER — INFLUENZA VAC SPLIT HIGH-DOSE 0.5 ML IM SUSY
0.5000 mL | PREFILLED_SYRINGE | INTRAMUSCULAR | Status: AC
  Administered 2018-12-15: 0.5 mL via INTRAMUSCULAR
  Filled 2018-12-13: qty 0.5

## 2018-12-13 NOTE — Evaluation (Signed)
Physical Therapy Evaluation Patient Details Name: Aaron Nelson MRN: [id number] DOB: 12-08-1930 Today's Date: 12/13/2018   History of Present Illness  Aaron Nelson is a 84 y.o. male with medical history significant of recurrent syncope and presyncope, sick sinus syndrome status post pacemaker placement, myelodysplasia, hypertension, diabetes, early dementia who lives with his son at home.  Patient was recently admitted to the hospital and discharged due to recurrent fall.  He was apparently at home with his son when patient forgot to use his walker and walked towards the bathroom.  He went in the fell backward hitting the back of his head on the floor.  He has an open laceration which was addressed in the ER.  Patient is on reported that he has had this recurrent dizziness episodes.  Clinical Impression   Pt admitted with above diagnosis. Pt currently with functional limitations due to the deficits listed below (see PT Problem List). Must manage at home independently for greater part of the day when his son is at work; Therapist, sports with significnalty increased fall risk, orthostatic hypotension, decr activity tolerance; At this point, we must consider SNF stay for to maximize independence and safety with mobility;  Pt will benefit from skilled PT to increase their independence and safety with mobility to allow discharge to the venue listed below.       Follow Up Recommendations SNF    Equipment Recommendations  3in1 (PT)    Recommendations for Other Services       Precautions / Restrictions Precautions Precautions: Fall Precaution Comments: watch Orthostatic BPs      Mobility  Bed Mobility Overal bed mobility: Needs Assistance Bed Mobility: Supine to Sit;Sit to Supine     Supine to sit: Min assist Sit to supine: Min assist   General bed mobility comments: Sat up to EOB with min handheld assist to pull to sit; After a short time sitting up, Aaron Nelson eyes became spacey and fluttered  closed; assisted back to supine in bed  Transfers                 General transfer comment: Not tested yet, he became lightheaded sitting up to EOB  Ambulation/Gait             General Gait Details: Not tested yet, he became lightheaded sitting up to EOB  Stairs            Wheelchair Mobility    Modified Rankin (Stroke Patients Only)       Balance                                             Pertinent Vitals/Pain Pain Assessment: No/denies pain(no headache)    Home Living Family/patient expects to be discharged to:: Private residence Living Arrangements: Children Available Help at Discharge: Family Type of Home: House Home Access: Stairs to enter   Technical brewer of Steps: 2 Home Layout: One level Home Equipment: Environmental consultant - 2 wheels;Cane - single point;Wheelchair - manual      Prior Function Level of Independence: Independent with assistive device(s)         Comments: ambulatory with a cane     Hand Dominance        Extremity/Trunk Assessment   Upper Extremity Assessment Upper Extremity Assessment: Generalized weakness    Lower Extremity Assessment Lower Extremity Assessment: Generalized weakness  Communication   Communication: No difficulties  Cognition Arousal/Alertness: Awake/alert Behavior During Therapy: WFL for tasks assessed/performed Overall Cognitive Status: No family/caregiver present to determine baseline cognitive functioning                                        General Comments General comments (skin integrity, edema, etc.): Orthostatics taken earlier were positive for BP drop with upright postures; see vitals flowsheet    Exercises     Assessment/Plan    PT Assessment Patient needs continued PT services  PT Problem List Decreased strength;Decreased activity tolerance;Decreased balance;Decreased mobility;Decreased knowledge of use of DME;Decreased  cognition;Decreased safety awareness;Decreased knowledge of precautions;Cardiopulmonary status limiting activity       PT Treatment Interventions DME instruction;Gait training;Stair training;Functional mobility training;Therapeutic activities;Therapeutic exercise;Balance training;Cognitive remediation;Patient/family education    PT Goals (Current goals can be found in the Care Plan section)  Acute Rehab PT Goals Patient Stated Goal: did not state PT Goal Formulation: With patient Time For Goal Achievement: 12/27/18 Potential to Achieve Goals: Good    Frequency Min 2X/week   Barriers to discharge Decreased caregiver support      Co-evaluation               AM-PAC PT "6 Clicks" Mobility  Outcome Measure Help needed turning from your back to your side while in a flat bed without using bedrails?: A Little Help needed moving from lying on your back to sitting on the side of a flat bed without using bedrails?: A Little Help needed moving to and from a bed to a chair (including a wheelchair)?: A Little Help needed standing up from a chair using your arms (e.g., wheelchair or bedside chair)?: A Lot Help needed to walk in hospital room?: A Lot Help needed climbing 3-5 steps with a railing? : A Lot 6 Click Score: 15    End of Session   Activity Tolerance: Other (comment)(Limtied by lightheadedness in upright sitting) Patient left: in bed;with call bell/phone within reach;with bed alarm set Nurse Communication: Mobility status PT Visit Diagnosis: Unsteadiness on feet (R26.81);Other abnormalities of gait and mobility (R26.89);Dizziness and giddiness (R42)    Time: 9528-4132 PT Time Calculation (min) (ACUTE ONLY): 15 min   Charges:   PT Evaluation $PT Eval Moderate Complexity: 1 Mod          Roney [place], [place]  Acute Rehabilitation Services Pager [phone] Office [phone]   [place] 12/13/2018, 3:39 PM

## 2018-12-13 NOTE — Progress Notes (Signed)
PROGRESS NOTE    Nivan Melendrez  JAS:[id number] DOB: 05-09-1930 DOA: 12/20/2018 PCP: Charlott Rakes, MD    Brief Narrative: Aaron Nelson is a 84 y.o. male with medical history significant of recurrent syncope and presyncope, sick sinus syndrome status post pacemaker placement, myelodysplasia, hypertension, diabetes, early dementia who lives with his son at home comes in for recurrent falls and syncope.  CT of the head without contrast on his arrival cervical spine shows Atrophy and chronic small-vessel ischemic changes. Left parieto-occipital scalp hematoma without underlying skull fracture. 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  Assessment & Plan:   Principal Problem:   Postural dizziness with presyncope Active Problems:   Essential hypertension   Uncontrolled type 2 diabetes mellitus without complication, with long-term current use of insulin (HCC)   Myelofibrosis (HCC)   Sinus node dysfunction (HCC)   Syncope and collapse   S/P placement of cardiac pacemaker   Fall   Dementia due to Alzheimer's disease ([place])  Recurrent syncope Suspect secondary to hypoglycemic episodes in addition to arrhythmia's and deconditioning Overnight telemetry monitor did not show any abnormal rhythms We will get EP consult to check his pacemaker tomorrow. Avoid hypoglycemic episodes. Repeat echocardiogram.  PT evaluation recommending SNF.    Type 2 diabetes mellitus well controlled CBG (last 3)  Recent Labs    12/13/2018 2309 12/13/18 0738 12/13/18 1127  GLUCAP 88 82 70   Hemoglobin A1c 7. Continue with sliding scale insulin and decrease the dose of Lantus to 10 units daily.  History of sick sinus syndrome status post pacemaker.  History of myelofibrosis Follows up with oncology as outpatient.  Resume with Jakafi 10 mg twice daily.   Essential hypertension Well-controlled continue with Imdur and Norvasc   History of dementia Continue with Namenda   Acute renal  failure Probably secondary to dehydration. Improving with normal saline fluids.   Leukocytosis Unclear etiology.  Patient does not appear to be toxic at this time.   Anemia and thrombocytopenia Probably secondary to myelofibrosis.   DVT prophylaxis: Heparin  code Status: Full code Family Communication: Son at bedside  disposition Plan: Pending clinical improvement and possibly discharge to SNF in the next 24 to 48 hours.   Consultants:  None Procedures: None Antimicrobials: None  Subjective: Patient reports feeling better than yesterday no chest pain or shortness of breath at this time  Objective: Vitals:   12/13/18 0404 12/13/18 0506 12/13/18 0628 12/13/18 1009  BP: (!) 171/84   (!) 154/71  Pulse: 79   80  Resp: 18     Temp: 99.9 F (37.7 C)     TempSrc: Oral     SpO2: 100% 100%    Weight:   71.8 kg   Height:        Intake/Output Summary (Last 24 hours) at 12/13/2018 1012 Last data filed at 12/13/2018 0933 Gross per 24 hour  Intake 800 ml  Output 1450 ml  Net -650 ml   Filed Weights   11/26/2018 1806 12/13/2018 2300 12/13/18 0628  Weight: 66.2 kg 71.8 kg 71.8 kg    Examination:  General exam: Appears calm and comfortable  Respiratory system: Clear to auscultation. Respiratory effort normal. Cardiovascular system: S1 & S2 heard, RRR. No JVD,  Gastrointestinal system: Abdomen is nondistended, soft and nontender. No organomegaly or masses felt. Normal bowel sounds heard. Central nervous system: Alert and oriented to place and person Extremities: Symmetric 5 x 5 power. Skin: No rashes, lesions or ulcers Psychiatry:Mood & affect appropriate.  Data Reviewed: I have personally reviewed following labs and imaging studies  CBC: Recent Labs  Lab 12/10/18 1529 12/10/2018 1735 12/13/18 0634  WBC 12.9* 19.6* 13.6*  NEUTROABS 2.6 4.3  --   HGB 9.6* 9.9* 9.1*  HCT 33.0* 35.1* 31.6*  MCV 85.5 84.6 83.4  PLT 46* 50* 44*   Basic Metabolic Panel: Recent  Labs  Lab 12/10/18 1529 12/02/2018 1735 12/13/18 0634  NA 140 138 140  K 4.9 4.5 3.8  CL 106 105 108  CO2 23 23 24   GLUCOSE 211* 68* 107*  BUN 24* 18 13  CREATININE 1.84* 1.81* 1.57*  CALCIUM 9.0 9.3 8.9   GFR: Estimated Creatinine Clearance: 31 mL/min (A) (by C-G formula based on SCr of 1.57 mg/dL (H)). Liver Function Tests: Recent Labs  Lab 12/10/18 1529 11/26/2018 1735 12/13/18 0634  AST 56* 64* 57*  ALT 27 28 25   ALKPHOS 94 101 87  BILITOT 1.6* 1.3* 1.3*  PROT 7.0 7.8 7.1  ALBUMIN 3.9 4.0 3.5   No results for input(s): LIPASE, AMYLASE in the last 168 hours. No results for input(s): AMMONIA in the last 168 hours. Coagulation Profile: Recent Labs  Lab 12/04/2018 1735  INR 1.16   Cardiac Enzymes: Recent Labs  Lab 12/11/2018 1735  TROPONINI <0.03   BNP (last 3 results) No results for input(s): PROBNP in the last 8760 hours. HbA1C: No results for input(s): HGBA1C in the last 72 hours. CBG: Recent Labs  Lab 11/29/2018 1724 12/08/2018 2221 12/17/2018 2309 12/13/18 0738  GLUCAP 65* 95 88 82   Lipid Profile: No results for input(s): CHOL, HDL, LDLCALC, TRIG, CHOLHDL, LDLDIRECT in the last 72 hours. Thyroid Function Tests: No results for input(s): TSH, T4TOTAL, FREET4, T3FREE, THYROIDAB in the last 72 hours. Anemia Panel: Recent Labs    12/10/18 1528  FERRITIN 465*  TIBC 300  IRON 72   Sepsis Labs: No results for input(s): PROCALCITON, LATICACIDVEN in the last 168 hours.  No results found for this or any previous visit (from the past 240 hour(s)).       Radiology Studies: Ct Abdomen Pelvis Wo Contrast  Result Date: 12/17/2018 CLINICAL DATA:  Unwitnessed fall. Renal failure. EXAM: CT CHEST, ABDOMEN AND PELVIS WITHOUT CONTRAST TECHNIQUE: Multidetector CT imaging of the chest, abdomen and pelvis was performed following the standard protocol without IV contrast. COMPARISON:  Chest radiograph 12/11/2018. Pelvic radiograph 12/02/2018. Prior chest CT 12/02/2018.  FINDINGS: CT CHEST FINDINGS Cardiovascular: Dual lead pacer. Aortic atherosclerosis. Tortuous thoracic aorta. Normal heart size, without pericardial effusion. Lad coronary artery calcification. Mediastinum/Nodes: No mediastinal or definite hilar adenopathy, given limitations of unenhanced CT. Lungs/Pleura: No pleural fluid or pneumothorax. Right lower lobe calcified granuloma on image 85/4. Minimal left apical pleuroparenchymal scarring. Musculoskeletal: Moderate thoracic spondylosis. Ninth posterolateral left rib fracture is likely subacute, minimally displaced on image 52/3. CT ABDOMEN PELVIS FINDINGS Hepatobiliary: Mild degradation secondary to patient arm position, not raised above the head. Normal noncontrast appearance of the liver, gallbladder, biliary tract. Pancreas: Normal, without mass or ductal dilatation. Spleen: Atypical splenic morphology, without focal abnormality. Similar. Adrenals/Urinary Tract: Normal adrenal glands. Left kidney is ptotic. No renal calculi or hydronephrosis. Mild bladder wall irregularity. Stomach/Bowel: Normal stomach, without wall thickening. Large colonic stool burden, including a 5.9 cm stool ball in the rectum. Normal terminal ileum and appendix. Normal small bowel. Vascular/Lymphatic: Aortic and branch vessel atherosclerosis. No abdominopelvic adenopathy. Reproductive: Normal prostate. Other: No significant free fluid.  No free intraperitoneal air. Musculoskeletal: Suspect renal osteodystrophy, mild. Lumbosacral spondylosis  IMPRESSION: 1. Ninth posterolateral left rib fracture is likely subacute. 2. Otherwise, no acute or posttraumatic deformity identified. 3. Coronary artery atherosclerosis. 4. Mild degradation secondary to patient arm position. 5.  Aortic Atherosclerosis (ICD10-I70.0). 6. Possible constipation and fecal impaction. 7. Mild bladder wall irregularity could represent a component of outlet obstruction. Aortic Atherosclerosis (ICD10-I70.0). Electronically Signed    By: Abigail Miyamoto M.D.   On: 11/23/2018 19:26   Dg Chest 1 View  Result Date: 12/16/2018 CLINICAL DATA:  Trauma, unwitnessed fall EXAM: CHEST  1 VIEW COMPARISON:  12/02/2018, CT chest 12/02/2018 FINDINGS: Left-sided pacing device with similar appearance of intracardiac leads. No acute airspace disease or effusion. Normal heart size. Aortic atherosclerosis. No pneumothorax. IMPRESSION: No active disease. Electronically Signed   By: Donavan Foil M.D.   On: 11/27/2018 17:53   Ct Head Wo Contrast  Result Date: 12/04/2018 CLINICAL DATA:  Unwitnessed fall. Laceration to the back of the head. EXAM: CT HEAD WITHOUT CONTRAST CT CERVICAL SPINE WITHOUT CONTRAST TECHNIQUE: Multidetector CT imaging of the head and cervical spine was performed following the standard protocol without intravenous contrast. Multiplanar CT image reconstructions of the cervical spine were also generated. COMPARISON:  04/21/2018 FINDINGS: CT HEAD FINDINGS Brain: Generalized atrophy. Chronic small-vessel ischemic changes of the white matter. Old right parietal vertex cortical and subcortical infarction. No sign of acute infarction, mass lesion, hemorrhage, hydrocephalus or extra-axial collection. Vascular: There is atherosclerotic calcification of the major vessels at the base of the brain. Skull: No skull fracture. Sinuses/Orbits: Chronic mucoperiosteal thickening in the sphenoid sinus. Other: Left parieto-occipital scalp hematoma. CT CERVICAL SPINE FINDINGS Alignment: No traumatic malalignment. Skull base and vertebrae: No evidence of regional acute fracture. 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 Soft tissues and spinal canal: No soft tissue injury. Disc levels: Chronic fusion at C2-3 and C4-5. Chronic degenerative facet arthropathy and spondylosis at C3-4, C5-6, C6-7 and C7-T1. Upper chest: Negative Other: None IMPRESSION: 1. Head CT: No acute or traumatic intracranial finding. Atrophy and chronic small-vessel ischemic  changes. Left parieto-occipital scalp hematoma without underlying skull fracture. 2. Cervical spine CT: No acute traumatic finding. 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 Chronic degenerative changes as outlined above. Electronically Signed   By: Nelson Chimes M.D.   On: 12/13/2018 19:33   Ct Chest Wo Contrast  Result Date: 12/13/2018 CLINICAL DATA:  Unwitnessed fall. Renal failure. EXAM: CT CHEST, ABDOMEN AND PELVIS WITHOUT CONTRAST TECHNIQUE: Multidetector CT imaging of the chest, abdomen and pelvis was performed following the standard protocol without IV contrast. COMPARISON:  Chest radiograph 12/11/2018. Pelvic radiograph 11/24/2018. Prior chest CT 12/02/2018. FINDINGS: CT CHEST FINDINGS Cardiovascular: Dual lead pacer. Aortic atherosclerosis. Tortuous thoracic aorta. Normal heart size, without pericardial effusion. Lad coronary artery calcification. Mediastinum/Nodes: No mediastinal or definite hilar adenopathy, given limitations of unenhanced CT. Lungs/Pleura: No pleural fluid or pneumothorax. Right lower lobe calcified granuloma on image 85/4. Minimal left apical pleuroparenchymal scarring. Musculoskeletal: Moderate thoracic spondylosis. Ninth posterolateral left rib fracture is likely subacute, minimally displaced on image 52/3. CT ABDOMEN PELVIS FINDINGS Hepatobiliary: Mild degradation secondary to patient arm position, not raised above the head. Normal noncontrast appearance of the liver, gallbladder, biliary tract. Pancreas: Normal, without mass or ductal dilatation. Spleen: Atypical splenic morphology, without focal abnormality. Similar. Adrenals/Urinary Tract: Normal adrenal glands. Left kidney is ptotic. No renal calculi or hydronephrosis. Mild bladder wall irregularity. Stomach/Bowel: Normal stomach, without wall thickening. Large colonic stool burden, including a 5.9 cm stool ball in the rectum. Normal  terminal ileum and appendix. Normal small bowel. Vascular/Lymphatic: Aortic  and branch vessel atherosclerosis. No abdominopelvic adenopathy. Reproductive: Normal prostate. Other: No significant free fluid.  No free intraperitoneal air. Musculoskeletal: Suspect renal osteodystrophy, mild. Lumbosacral spondylosis IMPRESSION: 1. Ninth posterolateral left rib fracture is likely subacute. 2. Otherwise, no acute or posttraumatic deformity identified. 3. Coronary artery atherosclerosis. 4. Mild degradation secondary to patient arm position. 5.  Aortic Atherosclerosis (ICD10-I70.0). 6. Possible constipation and fecal impaction. 7. Mild bladder wall irregularity could represent a component of outlet obstruction. Aortic Atherosclerosis (ICD10-I70.0). Electronically Signed   By: Abigail Miyamoto M.D.   On: 12/16/2018 19:26   Ct Cervical Spine Wo Contrast  Result Date: 11/23/2018 CLINICAL DATA:  Unwitnessed fall. Laceration to the back of the head. EXAM: CT HEAD WITHOUT CONTRAST CT CERVICAL SPINE WITHOUT CONTRAST TECHNIQUE: Multidetector CT imaging of the head and cervical spine was performed following the standard protocol without intravenous contrast. Multiplanar CT image reconstructions of the cervical spine were also generated. COMPARISON:  04/21/2018 FINDINGS: CT HEAD FINDINGS Brain: Generalized atrophy. Chronic small-vessel ischemic changes of the white matter. Old right parietal vertex cortical and subcortical infarction. No sign of acute infarction, mass lesion, hemorrhage, hydrocephalus or extra-axial collection. Vascular: There is atherosclerotic calcification of the major vessels at the base of the brain. Skull: No skull fracture. Sinuses/Orbits: Chronic mucoperiosteal thickening in the sphenoid sinus. Other: Left parieto-occipital scalp hematoma. CT CERVICAL SPINE FINDINGS Alignment: No traumatic malalignment. Skull base and vertebrae: No evidence of regional acute fracture. 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 Soft tissues and spinal canal: No soft tissue injury. Disc  levels: Chronic fusion at C2-3 and C4-5. Chronic degenerative facet arthropathy and spondylosis at C3-4, C5-6, C6-7 and C7-T1. Upper chest: Negative Other: None IMPRESSION: 1. Head CT: No acute or traumatic intracranial finding. Atrophy and chronic small-vessel ischemic changes. Left parieto-occipital scalp hematoma without underlying skull fracture. 2. Cervical spine CT: No acute traumatic finding. 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 Chronic degenerative changes as outlined above. Electronically Signed   By: Nelson Chimes M.D.   On: 11/28/2018 19:33   Dg Pelvis Portable  Result Date: 12/05/2018 CLINICAL DATA:  Unwitnessed fall EXAM: PORTABLE PELVIS 1-2 VIEWS COMPARISON:  None. FINDINGS: SI joints are non widened. Pubic symphysis and rami are intact. No fracture or malalignment. IMPRESSION: No acute osseous abnormality Electronically Signed   By: Donavan Foil M.D.   On: 11/29/2018 17:53        Scheduled Meds: . amLODipine  10 mg Oral Daily  . aspirin EC  81 mg Oral Daily  . ferrous sulfate  325 mg Oral TID WC  . heparin  5,000 Units Subcutaneous Q8H  . [START ON 12/14/2018] Influenza vac split quadrivalent PF  0.5 mL Intramuscular Tomorrow-1000  . insulin aspart  0-5 Units Subcutaneous QHS  . insulin aspart  0-9 Units Subcutaneous TID WC  . isosorbide mononitrate  30 mg Oral Daily  . memantine  10 mg Oral BID  . ruxolitinib phosphate  10 mg Oral BID  . tamsulosin  0.4 mg Oral Daily   Continuous Infusions: . sodium chloride 75 mL/hr at 12/13/18 0033     LOS: 1 day    Time spent: 32 minutes    Hosie Poisson, MD [facility] Pager [phone]  If 7PM-7AM, please contact night-coverage [email or web address] Password [email or web address] 12/13/2018, 10:12 AM

## 2018-12-14 ENCOUNTER — Ambulatory Visit (INDEPENDENT_AMBULATORY_CARE_PROVIDER_SITE_OTHER): Payer: Medicare HMO

## 2018-12-14 ENCOUNTER — Telehealth: Payer: Self-pay | Admitting: *Deleted

## 2018-12-14 DIAGNOSIS — Z7189 Other specified counseling: Secondary | ICD-10-CM

## 2018-12-14 DIAGNOSIS — R55 Syncope and collapse: Secondary | ICD-10-CM | POA: Diagnosis not present

## 2018-12-14 DIAGNOSIS — R4182 Altered mental status, unspecified: Secondary | ICD-10-CM

## 2018-12-14 DIAGNOSIS — I495 Sick sinus syndrome: Secondary | ICD-10-CM

## 2018-12-14 DIAGNOSIS — D7581 Myelofibrosis: Secondary | ICD-10-CM

## 2018-12-14 DIAGNOSIS — Z515 Encounter for palliative care: Secondary | ICD-10-CM

## 2018-12-14 DIAGNOSIS — L8990 Pressure ulcer of unspecified site, unspecified stage: Secondary | ICD-10-CM

## 2018-12-14 LAB — BASIC METABOLIC PANEL
ANION GAP: 6 (ref 5–15)
BUN: 10 mg/dL (ref 8–23)
CO2: 24 mmol/L (ref 22–32)
Calcium: 8.7 mg/dL — ABNORMAL LOW (ref 8.9–10.3)
Chloride: 108 mmol/L (ref 98–111)
Creatinine, Ser: 1.43 mg/dL — ABNORMAL HIGH (ref 0.61–1.24)
GFR calc Af Amer: 51 mL/min — ABNORMAL LOW (ref >60)
GFR calc non Af Amer: 44 mL/min — ABNORMAL LOW (ref >60)
Glucose, Bld: 114 mg/dL — ABNORMAL HIGH (ref 70–99)
POTASSIUM: 3.9 mmol/L (ref 3.5–5.1)
Sodium: 138 mmol/L (ref 135–145)

## 2018-12-14 LAB — GLUCOSE, CAPILLARY
GLUCOSE-CAPILLARY: 164 mg/dL — AB (ref 70–99)
GLUCOSE-CAPILLARY: 149 mg/dL — AB (ref 70–99)
Glucose-Capillary: 118 mg/dL — ABNORMAL HIGH (ref 70–99)
Glucose-Capillary: 152 mg/dL — ABNORMAL HIGH (ref 70–99)

## 2018-12-14 LAB — PATHOLOGIST SMEAR REVIEW: Path Review: INCREASED

## 2018-12-14 MED ORDER — ADULT MULTIVITAMIN W/MINERALS CH
1.0000 | ORAL_TABLET | Freq: Every day | ORAL | Status: DC
  Administered 2018-12-14 – 2018-12-15 (×2): 1 via ORAL
  Filled 2018-12-14 (×2): qty 1

## 2018-12-14 MED ORDER — GLUCERNA SHAKE PO LIQD
237.0000 mL | Freq: Three times a day (TID) | ORAL | Status: DC
  Administered 2018-12-14 – 2018-12-15 (×5): 237 mL via ORAL

## 2018-12-14 MED ORDER — INSULIN GLARGINE 100 UNIT/ML ~~LOC~~ SOLN
10.0000 [IU] | SUBCUTANEOUS | Status: DC
  Administered 2018-12-15: 10 [IU] via SUBCUTANEOUS
  Filled 2018-12-14 (×2): qty 0.1

## 2018-12-14 NOTE — Consult Note (Addendum)
[place] Nurse wound consult note Reason for Consult: Consult requested for scapular wounds Wound type: Pt has 2 areas of deep tissue injury which he states occurred after a fall at home to left posterior shoulder. Pressure Injury POA: Yes Measurement: 3X2cm and 4X2cm, both darker-colored intact skin; no open wounds, drainage, or fluctuance Dressing procedure/placement/frequency: No topical treatment is indicated at this time. Please re-consult if further assistance is needed.  Thank-you,  Julien Girt MSN, [place], [place], [place], [place]

## 2018-12-14 NOTE — Evaluation (Signed)
Occupational Therapy Evaluation Patient Details Name: Aaron Nelson MRN: [id number] DOB: 1930/03/20 Today's Date: 12/14/2018    History of Present Illness Aaron Nelson is a 84 y.o. male PMHx:  recurrent syncope and presyncope, sick sinus syndrome status post pacemaker placement, myelodysplasia, HTN, DM , early dementia who lives with his son at home.  Patient was recently admitted to the hospital and discharged due to recurrent fall.  He was apparently at home with his son when patient forgot to use his walker and walked towards the bathroom.  He went in the fell backward hitting the back of his head on the floor.  He has an open laceration which was addressed in the ER.  Patient reports he has had this recurrent dizziness episodes.   Clinical Impression   This 84 yo male admitted with above presents to acute OT with his really BIG issue being his orthostatic BPs making him unable to safely do his basic ADLs. He will beneifit from acute OT with follow up OT at SNF to work on being able to be up on his feet safely without orthostatic issues.    Follow Up Recommendations  SNF;Supervision/Assistance - 24 hour    Equipment Recommendations  None recommended by OT       Precautions / Restrictions Precautions Precautions: Fall Precaution Comments: watch Orthostatic BPs Restrictions Weight Bearing Restrictions: No      Mobility Bed Mobility               General bed mobility comments: Pt sitting EOB wtih mobilty tech when I entered pt's room  Transfers Overall transfer level: Needs assistance Equipment used: Rolling walker (2 wheeled) Transfers: Sit to/from Stand Sit to Stand: Min guard              Balance Overall balance assessment: Needs assistance Sitting-balance support: No upper extremity supported Sitting balance-Leahy Scale: Good     Standing balance support: Bilateral upper extremity supported Standing balance-Leahy Scale: Poor Standing balance comment: reliant  on RW                           ADL either performed or assessed with clinical judgement   ADL Overall ADL's : Needs assistance/impaired Eating/Feeding: Independent;Sitting   Grooming: Set up;Supervision/safety;Sitting   Upper Body Bathing: Set up;Supervision/ safety;Sitting   Lower Body Bathing: Min guard;Sit to/from stand   Upper Body Dressing : Supervision/safety;Set up;Sitting   Lower Body Dressing: Sit to/from stand;Min guard   Toilet Transfer: Min guard;Stand-pivot;BSC;RW   Toileting- Clothing Manipulation and Hygiene: Minimal assistance;Sit to/from stand         General ADL Comments: Can only stand for minimal time due to orthostatics (<1 minute before he gets dizzy and sees black spots). Also  noted change in pt's breathing wtih standing up (became audible)--educated on purse lipped breathing     Vision Patient Visual Report: No change from baseline              Pertinent Vitals/Pain Pain Assessment: Faces Faces Pain Scale: Hurts a little bit Pain Location: back of his head where he fell Pain Descriptors / Indicators: Sore;Aching Pain Intervention(s): Limited activity within patient's tolerance;Repositioned;Heat applied     Hand Dominance Right   Extremity/Trunk Assessment Upper Extremity Assessment Upper Extremity Assessment: Overall WFL for tasks assessed           Communication Communication Communication: No difficulties   Cognition Arousal/Alertness: Awake/alert Behavior During Therapy: WFL for tasks assessed/performed Overall Cognitive Status:  History of cognitive impairments - at baseline                                                Home Living Family/patient expects to be discharged to:: Skilled nursing facility Living Arrangements: Children Available Help at Discharge: Family;Available PRN/intermittently Type of Home: House Home Access: Stairs to enter Entrance Stairs-Number of Steps: 2   Home  Layout: One level               Home Equipment: Walker - 2 wheels;Cane - single point;Wheelchair - manual          Prior Functioning/Environment Level of Independence: Independent with assistive device(s)        Comments: ambulatory with a cane but says he uses W/C in house most of time        OT Problem List: Decreased activity tolerance;Impaired balance (sitting and/or standing);Decreased safety awareness;Decreased cognition;Cardiopulmonary status limiting activity      OT Treatment/Interventions: Self-care/ADL training;DME and/or AE instruction;Patient/family education;Balance training    OT Goals(Current goals can be found in the care plan section) Acute Rehab OT Goals Patient Stated Goal: would like to go home, but understands the need for rehab due to safety concerns with basic ADLs and orthostatics OT Goal Formulation: With patient Time For Goal Achievement: 12/28/18 Potential to Achieve Goals: Good  OT Frequency: Min 2X/week   Barriers to D/C: Decreased caregiver support             AM-PAC OT "6 Clicks" Daily Activity     Outcome Measure Help from another person eating meals?: None Help from another person taking care of personal grooming?: A Little Help from another person toileting, which includes using toliet, bedpan, or urinal?: A Little Help from another person bathing (including washing, rinsing, drying)?: A Little Help from another person to put on and taking off regular upper body clothing?: A Little Help from another person to put on and taking off regular lower body clothing?: A Little 6 Click Score: 19   End of Session Equipment Utilized During Treatment: Rolling walker  Activity Tolerance: Other (comment)(limited due to orthostatic BPs) Patient left: in chair;with chair alarm set;with call bell/phone within reach  OT Visit Diagnosis: Unsteadiness on feet (R26.81);Other abnormalities of gait and mobility (R26.89);Muscle weakness (generalized)  (M62.81);History of falling (Z91.81);Dizziness and giddiness (R42)                Time: 4098-1191 OT Time Calculation (min): 34 min Charges:  OT General Charges $OT Visit: 1 Visit OT Evaluation $OT Eval Moderate Complexity: 1 Mod OT Treatments $Self Care/Home Management : 8-22 mins   Golden Circle, OTR/L Acute [employer] Pager [phone] Office [phone]     Almon Register 12/14/2018, 10:33 AM

## 2018-12-14 NOTE — Telephone Encounter (Signed)
Notified by [facility] - Dr. Tresa Moore wants to speak with Dr. Irene Limbo [phone] regarding patient. Msg routed to Dr. Irene Limbo

## 2018-12-14 NOTE — Progress Notes (Signed)
Initial Nutrition Assessment  DOCUMENTATION CODES:   Not applicable  INTERVENTION:   -Glucerna Shake po TID, each supplement provides 220 kcal and 10 grams of protein -MVI with minerals daly  NUTRITION DIAGNOSIS:   Inadequate oral intake related to decreased appetite(mastcatory difficulty) as evidenced by per patient/family report.  GOAL:   Patient will meet greater than or equal to 90% of their needs  MONITOR:   PO intake, Supplement acceptance, Labs, Weight trends, Skin, I & O's  REASON FOR ASSESSMENT:   Consult Assessment of nutrition requirement/status  ASSESSMENT:   Aaron Nelson is a 84 y.o. male with medical history significant of recurrent syncope and presyncope, sick sinus syndrome status post pacemaker placement, myelodysplasia, hypertension, diabetes, early dementia who lives with his son at home.  Patient was recently admitted to the hospital and discharged due to recurrent fall.  He was apparently at home with his son when patient forgot to use his walker and walked towards the bathroom.  He went in the fell backward hitting the back of his head on the floor.  He has an open laceration which was addressed in the ER.  Patient is on reported that he has had this recurrent dizziness episodes.  Patient has no understanding as to what brings them on.  EMS showed no significant drop in blood sugar.  Blood pressure was very much elevated systolic over 350 when they arrived the ER.  Patient was mobilized in the ER but was unsteady and unable to stand on his feet.  There is worried that this is due to significant arrhythmia with his extensive cardiac history.  He is therefore being admitted for further evaluation.  Pt admitted with recurrent syncope.  Spoke with pt at bedside, who reports decreased appetite over the past 3 weeks. He shares that he recently started having difficulty consuming harder meats, such as [employer] fried chicken. He consumes 3-4 small meals per day, which  mainly consist of greens and potatoes. Protein has been limited secondary to masticatory difficulty. Pt reports he consumes about 2/3 of his breakfast this morning of toast, syrup, and eggs. Noted meal completion has improved- PO 25-85%. Pt confirms appetite has improved since admission.  Pt endorses wt loss; he reports that he has lost about 20# within the past 3 weeks secondary to decreased appetite. However, this is not consistent with wt hs, which reveals wt stability over the past 9 months.   Discussed importance of good meal and supplement intake to promote healing. Pt amenable to supplements. Offered diet downgrade, however, politely declined as he has not had difficulty chewing foods since hospitalization.   Case discussed with RN, who reports pt is making good process and tolerating meals and medications well.   Last Hgb A1c: 7.0 (12/13/18). PTA DM medications are 40 units insulin glargine daily and 10 units insulin regular TID.   Labs reviewed: CBGS: 118-193 (inpatient orders for glycemic control are 0-5 units insulin spart q HS, 0-9 units insulin aspart TID with meals, and 8 units insulin glargine daily).   NUTRITION - FOCUSED PHYSICAL EXAM:    Most Recent Value  Orbital Region  No depletion  Upper Arm Region  Mild depletion  Thoracic and Lumbar Region  No depletion  Buccal Region  No depletion  Temple Region  Mild depletion  Clavicle Bone Region  Mild depletion  Clavicle and Acromion Bone Region  No depletion  Scapular Bone Region  Mild depletion  Dorsal Hand  Mild depletion  Patellar Region  Mild depletion  Anterior Thigh Region  Mild depletion  Posterior Calf Region  Mild depletion  Edema (RD Assessment)  Mild  Hair  Reviewed  Eyes  Reviewed  Mouth  Reviewed  Skin  Reviewed  Nails  Reviewed       Diet Order:   Diet Order            Diet Carb Modified Fluid consistency: Thin; Room service appropriate? Yes  Diet effective now              EDUCATION NEEDS:    Education needs have been addressed  Skin:  Skin Assessment: Skin Integrity Issues: Skin Integrity Issues:: DTI, Other (Comment) DTI: lt posterior shoulder x2 Other: rt lateral head laceration  Last BM:  12/13/18  Height:   Ht Readings from Last 1 Encounters:  12/02/2018 5\' 6"  (1.676 m)    Weight:   Wt Readings from Last 1 Encounters:  12/14/18 71.6 kg    Ideal Body Weight:  64.5 kg  BMI:  Body mass index is 25.49 kg/m.  Estimated Nutritional Needs:   Kcal:  1700-1900  Protein:  80-95 grams  Fluid:  1.7-1.9 L    Leina Babe A. Jimmye Norman, RD, LDN, CDE Pager: [phone] After hours Pager: [phone]

## 2018-12-14 NOTE — Consult Note (Signed)
Consultation Note Date: 12/14/2018   Patient Name: Aaron Nelson  DOB: [birth year]  MRN: [id number]  Age / Sex: 84 y.o., male  PCP: Charlott Rakes, MD Referring Physician: Hosie Poisson, MD  Reason for Consultation: Establishing goals of care  HPI/Patient Profile: 84 y.o. male  with past medical history of recurrent syncope and presyncope, sick sinus syndrome s/p pacemaker, myelofibrosis, HTN, diabetes, sinus arrest admitted on 11/27/2018 with dizziness and altered mental status with fall r/t orthostatic hypotension and hypoglycemia.   Clinical Assessment and Goals of Care: I met today with [name] along with his son, [name]. They tell me about concerns with getting him better. We discussed orthostatic hypotension and goals to adjust medication from hospitalist team and work with PT/OT to work with safety in movements/ambulating.   I spent some time explaining orthostatic hypotension, hypoglycemia (and poor intake at times; although albumin 3.5), and thrombocytopenia with risk of falling. They are trusting the medical team and their recommendations for medications and plan.   They are hopeful for [name] to eventually return home with [name]. [name] works occasionally but no set hours and is usually home with his father the majority of the time. [name] also tells [place] that he would want to "try" to be resuscitated BUT was very clear that he would not want to be left on life support for long. [name] also emphasizes his belief in God and prayer and says he is not afraid to die.   All questions/concerns addressed to best of my ability. Emotional support provided.   Primary Decision Maker NEXT OF KIN son [name] but patient able to make own decisions even though reported dementia he appeared to understand and be capable of own decision-making at this time.     SUMMARY OF RECOMMENDATIONS   - Full code desired -  Pursue short term rehab with longer term plan to return home with his son with tips/equipment/tools to keep him more safe from falling  Code Status/Advance Care Planning:  Full code   Symptom Management:   None currently  Palliative Prophylaxis:   Bowel Regimen and Delirium Protocol, Fall precautions  Additional Recommendations (Limitations, Scope, Preferences):  Full Scope Treatment  Psycho-social/Spiritual:   Desire for further Chaplaincy support:no  Additional Recommendations: Caregiving  Support/Resources  Prognosis:   Unable to determine  Discharge Planning: [place] for rehab with Palliative care service follow-up      Primary Diagnoses: Present on Admission: . Syncope and collapse . Sinus node dysfunction (HCC) . S/P placement of cardiac pacemaker . Myelofibrosis ([place]) . Essential hypertension . Fall . Dementia due to Alzheimer's disease ([place])   I have reviewed the medical record, interviewed the patient and family, and examined the patient. The following aspects are pertinent.  Past Medical History:  Diagnosis Date  . Diabetes mellitus without complication ([place])   . Hypertension   . Myelodysplasia (myelodysplastic syndrome) ([place])   . S/P placement of cardiac pacemaker 08/30/2017  . Sinus arrest   . Syncope   . Syncope and  collapse 08/30/2017   Social History   Socioeconomic History  . Marital status: Divorced    Spouse name: Not on file  . Number of children: Not on file  . Years of education: Not on file  . Highest education level: Not on file  Occupational History  . Not on file  Social Needs  . Financial resource strain: Not on file  . Food insecurity:    Worry: Not on file    Inability: Not on file  . Transportation needs:    Medical: Not on file    Non-medical: Not on file  Tobacco Use  . Smoking status: Never Smoker  . Smokeless tobacco: Never Used  Substance and Sexual Activity  . Alcohol use: No  . Drug use: No   . Sexual activity: Not Currently  Lifestyle  . Physical activity:    Days per week: Not on file    Minutes per session: Not on file  . Stress: Not on file  Relationships  . Social connections:    Talks on phone: Not on file    Gets together: Not on file    Attends religious service: Not on file    Active member of club or organization: Not on file    Attends meetings of clubs or organizations: Not on file    Relationship status: Not on file  Other Topics Concern  . Not on file  Social History Narrative  . Not on file   Family History  Problem Relation Age of Onset  . Hypertension Mother    Scheduled Meds: . aspirin EC  81 mg Oral Daily  . feeding supplement (GLUCERNA SHAKE)  237 mL Oral TID BM  . ferrous sulfate  325 mg Oral TID WC  . heparin  5,000 Units Subcutaneous Q8H  . Influenza vac split quadrivalent PF  0.5 mL Intramuscular Tomorrow-1000  . insulin aspart  0-5 Units Subcutaneous QHS  . insulin aspart  0-9 Units Subcutaneous TID WC  . [START ON 12/15/2018] insulin glargine  10 Units Subcutaneous BH-q7a  . isosorbide mononitrate  30 mg Oral Daily  . memantine  10 mg Oral BID  . multivitamin with minerals  1 tablet Oral Daily  . ruxolitinib phosphate  10 mg Oral BID  . tamsulosin  0.4 mg Oral Daily   Continuous Infusions: . sodium chloride 75 mL/hr at 12/14/18 0219   PRN Meds:.acetaminophen, nitroGLYCERIN, ondansetron **OR** ondansetron (ZOFRAN) IV No Known Allergies Review of Systems  Constitutional: Positive for activity change and appetite change.  Neurological: Positive for dizziness.    Physical Exam Vitals signs and nursing note reviewed.  Constitutional:      General: He is not in acute distress. Cardiovascular:     Rate and Rhythm: Regular rhythm.  Pulmonary:     Effort: Pulmonary effort is normal. No tachypnea, accessory muscle usage or respiratory distress.  Neurological:     Mental Status: He is alert and oriented to person, place, and time.       Vital Signs: BP 108/65 (BP Location: Right Arm)   Pulse 77   Temp 98 F (36.7 C) (Oral)   Resp 16   Ht '5\' 6"'  (1.676 m)   Wt 71.6 kg   SpO2 99%   BMI 25.49 kg/m  Pain Scale: 0-10   Pain Score: 2    SpO2: SpO2: 99 % O2 Device:SpO2: 99 % O2 Flow Rate: .   IO: Intake/output summary:   Intake/Output Summary (Last 24 hours) at 12/14/2018 1529 Last  data filed at 12/14/2018 5848 Gross per 24 hour  Intake 2281.8 ml  Output 2525 ml  Net -243.2 ml    LBM: Last BM Date: 12/13/18 Baseline Weight: Weight: 66.2 kg Most recent weight: Weight: 71.6 kg     Palliative Assessment/Data:   Flowsheet Rows     Most Recent Value  Intake Tab  Referral Department  Hospitalist  Unit at Time of Referral  Cardiac/Telemetry Unit  Date Notified  12/13/18  Palliative Care Type  New Palliative care  Reason for referral  Clarify Goals of Care  Date of Admission  12/08/2018  # of days IP prior to Palliative referral  1  Clinical Assessment  Psychosocial & Spiritual Assessment  Palliative Care Outcomes      Time In: 1400 Time Out: 1500 Time Total: 60 min Greater than 50%  of this time was spent counseling and coordinating care related to the above assessment and plan.  Signed by: Vinie Sill, NP Palliative Medicine Team Pager # [phone] (M-F 8a-5p) Team Phone # [phone] (Nights/Weekends)

## 2018-12-14 NOTE — Progress Notes (Signed)
Patient's orthostatics were positive.  Paged MD  Physical therapist came in while giving medications.  Sat patient on side of bed- in the process patient's eyes closed and he swayed back toward head of bed.  Laid patient back down.  Patient reported he felt dizzy with movement.

## 2018-12-14 NOTE — NC FL2 (Signed)
[place] MEDICAID FL2 LEVEL OF CARE SCREENING TOOL     IDENTIFICATION  Patient Name: Aaron Nelson Birthdate: 02-15-1930 Sex: male Admission Date (Current Location): 11/29/2018  [facility] and [place] Number:  Herbalist and Address:  The [place], [place] [address] [place], [place] [address]      Provider Number: [id number]  Attending Physician Name and Address:  Hosie Poisson, MD  Relative Name and Phone Number:       Current Level of Care: Hospital Recommended Level of Care: [place] Prior Approval Number:    Date Approved/Denied:   PASRR Number: [id number]  Discharge Plan: SNF    Current Diagnoses: Patient Active Problem List   Diagnosis Date Noted  . Pressure injury of skin 12/14/2018  . Fall 11/30/2018  . Dementia due to Alzheimer's disease ([place]) 11/28/2018  . Memory loss 08/17/2018  . Closed fracture of nasal bones   . Postural dizziness with presyncope   . Syncope 04/21/2018  . Benign prostatic hyperplasia with hesitancy 12/10/2017  . Hyperglycemia 12/10/2017  . Abnormal CT scan, kidney 10/15/2017  . Syncope and collapse 08/30/2017  . Sinus arrest 20 sec with syncope 08/30/2017  . S/P placement of cardiac pacemaker 08/30/2017  . Sinus node dysfunction ([place]) 08/29/2017  . Myelofibrosis ([place]) 07/24/2017  . JAK2 V617F mutation 07/24/2017  . Essential hypertension 07/15/2017  . Gait disturbance 07/15/2017  . Uncontrolled type 2 diabetes mellitus without complication, with long-term current use of insulin ([place]) 07/15/2017  . Leukopenia 07/03/2017  . Anemia 07/03/2017  . Thrombocytosis ([place]) 07/03/2017    Orientation RESPIRATION BLADDER Height & Weight     Self, Time, Situation, Place  Normal Continent Weight: 157 lb 14.4 oz (71.6 kg) Height:  5\' 6"  (167.6 cm)  BEHAVIORAL SYMPTOMS/MOOD NEUROLOGICAL BOWEL NUTRITION STATUS  (None) (Dementia due to Alzheimer's Disease) Continent Diet(Carb modified)   AMBULATORY STATUS COMMUNICATION OF NEEDS Skin   Extensive Assist Verbally Other (Comment)(Deep tissue injury on left posterior shoulder: No dressing. Laceration on right lateral anterior head: Gauze prn.)                       Personal Care Assistance Level of Assistance  Bathing, Feeding, Dressing Bathing Assistance: Limited assistance Feeding assistance: Independent Dressing Assistance: Limited assistance     Functional Limitations Info  Sight, Hearing, Speech Sight Info: Adequate Hearing Info: Adequate Speech Info: Adequate    SPECIAL CARE FACTORS FREQUENCY  PT (By licensed PT), OT (By licensed OT), Blood pressure     PT Frequency: 5 x week OT Frequency: 5 x week            Contractures Contractures Info: Not present    Additional Factors Info  Code Status, Allergies Code Status Info: Full Allergies Info: NKDA           Current Medications (12/14/2018):  This is the current hospital active medication list Current Facility-Administered Medications  Medication Dose Route Frequency Provider Last Rate Last Dose  . 0.9 %  sodium chloride infusion   Intravenous Continuous Elwyn Reach, MD 75 mL/hr at 12/14/18 0219    . acetaminophen (TYLENOL) tablet 650 mg  650 mg Oral Q4H PRN Elwyn Reach, MD   650 mg at 12/14/18 0840  . aspirin EC tablet 81 mg  81 mg Oral Daily Elwyn Reach, MD   81 mg at 12/14/18 0841  . feeding supplement (GLUCERNA SHAKE) (GLUCERNA SHAKE) liquid 237 mL  237 mL Oral  TID BM Hosie Poisson, MD   237 mL at 12/14/18 1244  . ferrous sulfate tablet 325 mg  325 mg Oral TID WC Gala Romney L, MD   325 mg at 12/14/18 1244  . heparin injection 5,000 Units  5,000 Units Subcutaneous Q8H Elwyn Reach, MD   5,000 Units at 12/14/18 [phone]  . Influenza vac split quadrivalent PF (FLUZONE HIGH-DOSE) injection 0.5 mL  0.5 mL Intramuscular Tomorrow-1000 Hosie Poisson, MD      . insulin aspart (novoLOG) injection 0-5 Units  0-5 Units  Subcutaneous QHS Garba, Mohammad L, MD      . insulin aspart (novoLOG) injection 0-9 Units  0-9 Units Subcutaneous TID WC Elwyn Reach, MD   2 Units at 12/14/18 1244  . [START ON 12/15/2018] insulin glargine (LANTUS) injection 10 Units  10 Units Subcutaneous Carole Civil, Jeoffrey Massed, MD      . isosorbide mononitrate (IMDUR) 24 hr tablet 30 mg  30 mg Oral Daily Elwyn Reach, MD   30 mg at 12/14/18 0841  . memantine (NAMENDA) tablet 10 mg  10 mg Oral BID Elwyn Reach, MD   10 mg at 12/14/18 0841  . multivitamin with minerals tablet 1 tablet  1 tablet Oral Daily Hosie Poisson, MD   1 tablet at 12/14/18 1244  . nitroGLYCERIN (NITROSTAT) SL tablet 0.4 mg  0.4 mg Sublingual Q5 min PRN Gala Romney L, MD      . ondansetron (ZOFRAN) tablet 4 mg  4 mg Oral Q6H PRN Elwyn Reach, MD       Or  . ondansetron (ZOFRAN) injection 4 mg  4 mg Intravenous Q6H PRN Gala Romney L, MD      . ruxolitinib phosphate (JAKAFI) tablet 10 mg  10 mg Oral BID Gala Romney L, MD   10 mg at 12/14/18 1024  . tamsulosin (FLOMAX) capsule 0.4 mg  0.4 mg Oral Daily Elwyn Reach, MD   0.4 mg at 12/14/18 6415     Discharge Medications: Please see discharge summary for a list of discharge medications.  Relevant Imaging Results:  Relevant Lab Results:   Additional Information SS#: [id number]  Candie Chroman, LCSW

## 2018-12-14 NOTE — Clinical Social Work Placement (Signed)
   CLINICAL SOCIAL WORK PLACEMENT  NOTE  Date:  12/14/2018  Patient Details  Name: Aaron Nelson MRN: [id number] Date of Birth: August 21, 1930  Clinical Social Work is seeking post-discharge placement for this patient at the [place] level of care (*CSW will initial, date and re-position this form in  chart as items are completed):      Patient/family provided with [facility] Work Department's list of facilities offering this level of care within the geographic area requested by the patient (or if unable, by the patient's family).      Patient/family informed of their freedom to choose among providers that offer the needed level of care, that participate in Medicare, Medicaid or managed care program needed by the patient, have an available bed and are willing to accept the patient.      Patient/family informed of [facility]'s ownership interest in [facility] and [facility], as well as of the fact that they are under no obligation to receive care at these facilities.  PASRR submitted to EDS on 12/14/18     PASRR number received on 12/14/18     Existing PASRR number confirmed on       FL2 transmitted to all facilities in geographic area requested by pt/family on 12/14/18     FL2 transmitted to all facilities within larger geographic area on       Patient informed that his/her managed care company has contracts with or will negotiate with certain facilities, including the following:            Patient/family informed of bed offers received.  Patient chooses bed at       Physician recommends and patient chooses bed at      Patient to be transferred to   on  .  Patient to be transferred to facility by       Patient family notified on   of transfer.  Name of family member notified:        PHYSICIAN Please sign FL2     Additional Comment:    _______________________________________________ Candie Chroman, LCSW 12/14/2018, 1:05  PM

## 2018-12-14 NOTE — Clinical Social Work Note (Signed)
Provided patient with list of bed offers and CMS scores. Will follow up tomorrow for decision.  [place] Scrape, [place]

## 2018-12-14 NOTE — Progress Notes (Signed)
Device interrogation (full copy in paper chart): Normal device function AP 9%, VP <1% 1 episode of AT on 11/16/18 - duration 12 seconds. No other episodes  Chanetta Marshall, NP 12/14/2018 10:37 AM

## 2018-12-14 NOTE — Progress Notes (Deleted)
Patient Care Team: Charlott Rakes, MD as PCP - General (Family Medicine) Jackelyn Knife, MD as Rounding Team (Internal Medicine)   HPI  Aaron Nelson is a 84 y.o. male With a chief complaint of pacemaker implanted for sinus arrest and syncope having been demonstrated on a loop recorder-- subsequently explanted.  Also profound orthostatic hypotension  And post micturition syncope     DATE TEST EF   10/18 Echo   55-65 %   10/18 MYOVIEW 50% No Ischemia  5/19 Echo  65-70%  LVH mild  *** Date Cr K Hgb  12/19 1.43 3.9 9.1           Records and Results Reviewed   Past Medical History:  Diagnosis Date  . Diabetes mellitus without complication ([place])   . Hypertension   . Myelodysplasia (myelodysplastic syndrome) ([place])   . S/P placement of cardiac pacemaker 08/30/2017  . Sinus arrest   . Syncope   . Syncope and collapse 08/30/2017    Past Surgical History:  Procedure Laterality Date  . BONE MARROW BIOPSY  2017  . CATARACT EXTRACTION    . EYE SURGERY    . LOOP RECORDER REMOVAL N/A 08/29/2017   Procedure: LOOP RECORDER REMOVAL;  Surgeon: Deboraha Sprang, MD;  Location: [facility];  Service: Cardiovascular;  Laterality: N/A;  . PACEMAKER IMPLANT Left 08/29/2017   St Jude generator  . PACEMAKER IMPLANT N/A 08/29/2017   Procedure: Pacemaker Implant;  Surgeon: Deboraha Sprang, MD;  Location: [facility];  Service: Cardiovascular;  Laterality: N/A;  . PORTACATH PLACEMENT  2017    No current facility-administered medications for this visit.    No current outpatient medications on file.   Facility-Administered Medications Ordered in Other Visits  Medication Dose Route Frequency Provider Last Rate Last Dose  . 0.9 %  sodium chloride infusion   Intravenous Continuous Elwyn Reach, MD 75 mL/hr at 12/14/18 0219    . acetaminophen (TYLENOL) tablet 650 mg  650 mg Oral Q4H PRN Elwyn Reach, MD   650 mg at 12/14/18 0840  . aspirin EC tablet 81 mg  81 mg  Oral Daily Elwyn Reach, MD   81 mg at 12/14/18 0841  . ferrous sulfate tablet 325 mg  325 mg Oral TID WC Gala Romney L, MD   325 mg at 12/14/18 0841  . heparin injection 5,000 Units  5,000 Units Subcutaneous Q8H Elwyn Reach, MD   5,000 Units at 12/14/18 [phone]  . Influenza vac split quadrivalent PF (FLUZONE HIGH-DOSE) injection 0.5 mL  0.5 mL Intramuscular Tomorrow-1000 Hosie Poisson, MD      . insulin aspart (novoLOG) injection 0-5 Units  0-5 Units Subcutaneous QHS Garba, Mohammad L, MD      . insulin aspart (novoLOG) injection 0-9 Units  0-9 Units Subcutaneous TID WC Elwyn Reach, MD   2 Units at 12/13/18 1801  . insulin glargine (LANTUS) injection 8 Units  8 Units Subcutaneous Trilby Drummer, MD   8 Units at 12/14/18 [phone]  . isosorbide mononitrate (IMDUR) 24 hr tablet 30 mg  30 mg Oral Daily Elwyn Reach, MD   30 mg at 12/14/18 0841  . memantine (NAMENDA) tablet 10 mg  10 mg Oral BID Elwyn Reach, MD   10 mg at 12/14/18 0841  . nitroGLYCERIN (NITROSTAT) SL tablet 0.4 mg  0.4 mg Sublingual Q5 min PRN Elwyn Reach, MD      . ondansetron [facility])  tablet 4 mg  4 mg Oral Q6H PRN Elwyn Reach, MD       Or  . ondansetron (ZOFRAN) injection 4 mg  4 mg Intravenous Q6H PRN Gala Romney L, MD      . ruxolitinib phosphate (JAKAFI) tablet 10 mg  10 mg Oral BID Gala Romney L, MD   10 mg at 12/14/18 1024  . tamsulosin (FLOMAX) capsule 0.4 mg  0.4 mg Oral Daily Gala Romney L, MD   0.4 mg at 12/14/18 0841    No Known Allergies    Review of Systems negative except from HPI and PMH  Physical Exam There were no vitals taken for this visit. *** ECG***  Assessment and  Plan  Systolic hypertension  Orthostatic lightheadedness/syncope  Sinus arrest with pauses  Pacemaker St Jude   ***   His orthostatic changes are PROFOUND and his systolic hypertension is very high.  This will be difficult to manage.  We discussed the need to increase  antihypertensive therapy so have elected to change amlodipine  2.5>>10 its long t 1/2 willcause these effects to be somewhat gradual   I will reach out to his PCP for closer followup  Management of the falling BP is the greater challenge both because of the profound nature of the change and the consequences which are falls  Non pharmacological options reduce drug interactions--so will start with abdominal binder and isometric contraction prior to standing.  Pyridostigmine 30-60 bid would be next   No interval syncope   Current medicines are reviewed at length with the patient today .  The patient does not  have concerns regarding medicines.

## 2018-12-14 NOTE — Progress Notes (Signed)
Remote pacemaker transmission.   

## 2018-12-14 NOTE — Progress Notes (Signed)
PROGRESS NOTE    Aaron Nelson  GHW:[id number] DOB: 02-14-1930 DOA: 11/26/2018 PCP: Charlott Rakes, MD    Brief Narrative: Aaron Nelson is a 84 y.o. male with medical history significant of recurrent syncope and presyncope, sick sinus syndrome status post pacemaker placement, myelodysplasia, hypertension, diabetes, early dementia who lives with his son at home comes in for recurrent falls and syncope.  CT of the head without contrast on his arrival cervical spine shows Atrophy and chronic small-vessel ischemic changes. Left parieto-occipital scalp hematoma without underlying skull fracture. 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  Assessment & Plan:   Principal Problem:   Postural dizziness with presyncope Active Problems:   Essential hypertension   Uncontrolled type 2 diabetes mellitus without complication, with long-term current use of insulin (HCC)   Myelofibrosis (HCC)   Sinus node dysfunction (HCC)   Syncope and collapse   S/P placement of cardiac pacemaker   Fall   Dementia due to Alzheimer's disease ([place])   Pressure injury of skin  Recurrent syncope Suspect secondary to hypoglycemic episodes in addition to arrhythmia's and deconditioning EP consult to interrogate the pacemaker.  Avoid hypoglycemic episodes. Decreased the dose of lantus to 10 units daily.  Repeat echocardiogram.  PT evaluation recommending SNF. Pt reports dizziness on getting up from lying position and his orthostatic vital signs were positive.  Recommend to continue with NS fluids and stopped the amlodipine.  TED hoses ordered.  Recheck orthostatic vital signs tomorrow, if still positive, will d/c imdur.     Type 2 diabetes mellitus well controlled CBG (last 3)  Recent Labs    12/13/18 1638 12/13/18 2108 12/14/18 0752  GLUCAP 176* 193* 118*   Hemoglobin A1c 7. Continue with sliding scale insulin and decrease the dose of Lantus to 10 units daily.  History of sick sinus syndrome  status post pacemaker. EP consulted and pace maker interrogated, normal device function.   History of myelofibrosis Follows up with oncology as outpatient.  Resume with Jakafi 10 mg twice daily.   Essential hypertension Orthostatic earlier today with OT eval.  Stop the norvasc and recheck orthostatic vital signs in am.    History of dementia Continue with Namenda   Acute renal failure Probably secondary to dehydration. Improving with normal saline fluids.   Leukocytosis Unclear etiology.  Patient does not appear to be toxic at this time.   Anemia and thrombocytopenia Probably secondary to myelofibrosis.  Pressure injury to the left posterior shoulder present on admission, from fall . Intact skin.  Wound care consulted and recommendations given.   DVT prophylaxis: Heparin  code Status: Full code Family Communication: Son at bedside  disposition Plan: Pending clinical improvement and possibly discharge to SNF in the next 24 to 48 hours.   Consultants:  EP consult.   Procedures: Echocardiogram.   Antimicrobials: None  Subjective: Reports dizziness on standing up from lying position.  Some pain in the back of the head.  No chest pain or sob.    Objective: Vitals:   12/13/18 2022 12/14/18 0005 12/14/18 0425 12/14/18 0756  BP: (!) 151/70 (!) 177/86 (!) 190/86 (!) 197/92  Pulse: 70 72 67 74  Resp: 20 14 14 19   Temp: 100 F (37.8 C) 99.2 F (37.3 C) 99.1 F (37.3 C) (!) 97.4 F (36.3 C)  TempSrc: Oral Oral Oral Oral  SpO2: 100% 100% 99% 100%  Weight:   71.6 kg   Height:        Intake/Output Summary (Last 24 hours)  at 12/14/2018 1015 Last data filed at 12/14/2018 0635 Gross per 24 hour  Intake 2641.8 ml  Output 2675 ml  Net -33.2 ml   Filed Weights   12/17/2018 2300 12/13/18 0628 12/14/18 0425  Weight: 71.8 kg 71.8 kg 71.6 kg    Examination:  General exam: Appears calm and comfortable , no distress noted.  Respiratory system: Clear to  auscultation. Respiratory effort normal. No wheezing or rhonchi.  Cardiovascular system: S1 & S2 heard, RRR. No JVD,  Gastrointestinal system: Abdomen is soft, NT nd bs+ Central nervous system: Alert and oriented to place and person Extremities: Symmetric 5 x 5 power. No pedal edema or cyanosis.  Skin: No rashes, lesions or ulcers Psychiatry:Mood & affect appropriate.     Data Reviewed: I have personally reviewed following labs and imaging studies  CBC: Recent Labs  Lab 12/10/18 1529 12/13/2018 1735 12/13/18 0634  WBC 12.9* 19.6* 13.6*  NEUTROABS 2.6 4.3  --   HGB 9.6* 9.9* 9.1*  HCT 33.0* 35.1* 31.6*  MCV 85.5 84.6 83.4  PLT 46* 50* 44*   Basic Metabolic Panel: Recent Labs  Lab 12/10/18 1529 12/04/2018 1735 12/13/18 0634 12/14/18 0521  NA 140 138 140 138  K 4.9 4.5 3.8 3.9  CL 106 105 108 108  CO2 23 23 24 24   GLUCOSE 211* 68* 107* 114*  BUN 24* 18 13 10   CREATININE 1.84* 1.81* 1.57* 1.43*  CALCIUM 9.0 9.3 8.9 8.7*   GFR: Estimated Creatinine Clearance: 34.1 mL/min (A) (by C-G formula based on SCr of 1.43 mg/dL (H)). Liver Function Tests: Recent Labs  Lab 12/10/18 1529 12/05/2018 1735 12/13/18 0634  AST 56* 64* 57*  ALT 27 28 25   ALKPHOS 94 101 87  BILITOT 1.6* 1.3* 1.3*  PROT 7.0 7.8 7.1  ALBUMIN 3.9 4.0 3.5   No results for input(s): LIPASE, AMYLASE in the last 168 hours. No results for input(s): AMMONIA in the last 168 hours. Coagulation Profile: Recent Labs  Lab 12/17/2018 1735  INR 1.16   Cardiac Enzymes: Recent Labs  Lab 12/22/2018 1735  TROPONINI <0.03   BNP (last 3 results) No results for input(s): PROBNP in the last 8760 hours. HbA1C: Recent Labs    12/13/18 0634  HGBA1C 7.0*   CBG: Recent Labs  Lab 12/13/18 0738 12/13/18 1127 12/13/18 1638 12/13/18 2108 12/14/18 0752  GLUCAP 82 70 176* 193* 118*   Lipid Profile: No results for input(s): CHOL, HDL, LDLCALC, TRIG, CHOLHDL, LDLDIRECT in the last 72 hours. Thyroid Function  Tests: No results for input(s): TSH, T4TOTAL, FREET4, T3FREE, THYROIDAB in the last 72 hours. Anemia Panel: No results for input(s): VITAMINB12, FOLATE, FERRITIN, TIBC, IRON, RETICCTPCT in the last 72 hours. Sepsis Labs: No results for input(s): PROCALCITON, LATICACIDVEN in the last 168 hours.  No results found for this or any previous visit (from the past 240 hour(s)).       Radiology Studies: Ct Abdomen Pelvis Wo Contrast  Result Date: 11/28/2018 CLINICAL DATA:  Unwitnessed fall. Renal failure. EXAM: CT CHEST, ABDOMEN AND PELVIS WITHOUT CONTRAST TECHNIQUE: Multidetector CT imaging of the chest, abdomen and pelvis was performed following the standard protocol without IV contrast. COMPARISON:  Chest radiograph 12/11/2018. Pelvic radiograph 11/25/2018. Prior chest CT 12/02/2018. FINDINGS: CT CHEST FINDINGS Cardiovascular: Dual lead pacer. Aortic atherosclerosis. Tortuous thoracic aorta. Normal heart size, without pericardial effusion. Lad coronary artery calcification. Mediastinum/Nodes: No mediastinal or definite hilar adenopathy, given limitations of unenhanced CT. Lungs/Pleura: No pleural fluid or pneumothorax. Right lower lobe calcified granuloma  on image 85/4. Minimal left apical pleuroparenchymal scarring. Musculoskeletal: Moderate thoracic spondylosis. Ninth posterolateral left rib fracture is likely subacute, minimally displaced on image 52/3. CT ABDOMEN PELVIS FINDINGS Hepatobiliary: Mild degradation secondary to patient arm position, not raised above the head. Normal noncontrast appearance of the liver, gallbladder, biliary tract. Pancreas: Normal, without mass or ductal dilatation. Spleen: Atypical splenic morphology, without focal abnormality. Similar. Adrenals/Urinary Tract: Normal adrenal glands. Left kidney is ptotic. No renal calculi or hydronephrosis. Mild bladder wall irregularity. Stomach/Bowel: Normal stomach, without wall thickening. Large colonic stool burden, including a 5.9 cm  stool ball in the rectum. Normal terminal ileum and appendix. Normal small bowel. Vascular/Lymphatic: Aortic and branch vessel atherosclerosis. No abdominopelvic adenopathy. Reproductive: Normal prostate. Other: No significant free fluid.  No free intraperitoneal air. Musculoskeletal: Suspect renal osteodystrophy, mild. Lumbosacral spondylosis IMPRESSION: 1. Ninth posterolateral left rib fracture is likely subacute. 2. Otherwise, no acute or posttraumatic deformity identified. 3. Coronary artery atherosclerosis. 4. Mild degradation secondary to patient arm position. 5.  Aortic Atherosclerosis (ICD10-I70.0). 6. Possible constipation and fecal impaction. 7. Mild bladder wall irregularity could represent a component of outlet obstruction. Aortic Atherosclerosis (ICD10-I70.0). Electronically Signed   By: Abigail Miyamoto M.D.   On: 12/10/2018 19:26   Dg Chest 1 View  Result Date: 12/03/2018 CLINICAL DATA:  Trauma, unwitnessed fall EXAM: CHEST  1 VIEW COMPARISON:  12/02/2018, CT chest 12/02/2018 FINDINGS: Left-sided pacing device with similar appearance of intracardiac leads. No acute airspace disease or effusion. Normal heart size. Aortic atherosclerosis. No pneumothorax. IMPRESSION: No active disease. Electronically Signed   By: Donavan Foil M.D.   On: 12/08/2018 17:53   Ct Head Wo Contrast  Result Date: 12/22/2018 CLINICAL DATA:  Unwitnessed fall. Laceration to the back of the head. EXAM: CT HEAD WITHOUT CONTRAST CT CERVICAL SPINE WITHOUT CONTRAST TECHNIQUE: Multidetector CT imaging of the head and cervical spine was performed following the standard protocol without intravenous contrast. Multiplanar CT image reconstructions of the cervical spine were also generated. COMPARISON:  04/21/2018 FINDINGS: CT HEAD FINDINGS Brain: Generalized atrophy. Chronic small-vessel ischemic changes of the white matter. Old right parietal vertex cortical and subcortical infarction. No sign of acute infarction, mass lesion,  hemorrhage, hydrocephalus or extra-axial collection. Vascular: There is atherosclerotic calcification of the major vessels at the base of the brain. Skull: No skull fracture. Sinuses/Orbits: Chronic mucoperiosteal thickening in the sphenoid sinus. Other: Left parieto-occipital scalp hematoma. CT CERVICAL SPINE FINDINGS Alignment: No traumatic malalignment. Skull base and vertebrae: No evidence of regional acute fracture. 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 Soft tissues and spinal canal: No soft tissue injury. Disc levels: Chronic fusion at C2-3 and C4-5. Chronic degenerative facet arthropathy and spondylosis at C3-4, C5-6, C6-7 and C7-T1. Upper chest: Negative Other: None IMPRESSION: 1. Head CT: No acute or traumatic intracranial finding. Atrophy and chronic small-vessel ischemic changes. Left parieto-occipital scalp hematoma without underlying skull fracture. 2. Cervical spine CT: No acute traumatic finding. 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 Chronic degenerative changes as outlined above. Electronically Signed   By: Nelson Chimes M.D.   On: 12/22/2018 19:33   Ct Chest Wo Contrast  Result Date: 12/17/2018 CLINICAL DATA:  Unwitnessed fall. Renal failure. EXAM: CT CHEST, ABDOMEN AND PELVIS WITHOUT CONTRAST TECHNIQUE: Multidetector CT imaging of the chest, abdomen and pelvis was performed following the standard protocol without IV contrast. COMPARISON:  Chest radiograph 12/11/2018. Pelvic radiograph 11/25/2018. Prior chest CT 12/02/2018. FINDINGS: CT CHEST FINDINGS Cardiovascular: Dual lead pacer. Aortic atherosclerosis.  Tortuous thoracic aorta. Normal heart size, without pericardial effusion. Lad coronary artery calcification. Mediastinum/Nodes: No mediastinal or definite hilar adenopathy, given limitations of unenhanced CT. Lungs/Pleura: No pleural fluid or pneumothorax. Right lower lobe calcified granuloma on image 85/4. Minimal left apical pleuroparenchymal scarring.  Musculoskeletal: Moderate thoracic spondylosis. Ninth posterolateral left rib fracture is likely subacute, minimally displaced on image 52/3. CT ABDOMEN PELVIS FINDINGS Hepatobiliary: Mild degradation secondary to patient arm position, not raised above the head. Normal noncontrast appearance of the liver, gallbladder, biliary tract. Pancreas: Normal, without mass or ductal dilatation. Spleen: Atypical splenic morphology, without focal abnormality. Similar. Adrenals/Urinary Tract: Normal adrenal glands. Left kidney is ptotic. No renal calculi or hydronephrosis. Mild bladder wall irregularity. Stomach/Bowel: Normal stomach, without wall thickening. Large colonic stool burden, including a 5.9 cm stool ball in the rectum. Normal terminal ileum and appendix. Normal small bowel. Vascular/Lymphatic: Aortic and branch vessel atherosclerosis. No abdominopelvic adenopathy. Reproductive: Normal prostate. Other: No significant free fluid.  No free intraperitoneal air. Musculoskeletal: Suspect renal osteodystrophy, mild. Lumbosacral spondylosis IMPRESSION: 1. Ninth posterolateral left rib fracture is likely subacute. 2. Otherwise, no acute or posttraumatic deformity identified. 3. Coronary artery atherosclerosis. 4. Mild degradation secondary to patient arm position. 5.  Aortic Atherosclerosis (ICD10-I70.0). 6. Possible constipation and fecal impaction. 7. Mild bladder wall irregularity could represent a component of outlet obstruction. Aortic Atherosclerosis (ICD10-I70.0). Electronically Signed   By: Abigail Miyamoto M.D.   On: 12/10/2018 19:26   Ct Cervical Spine Wo Contrast  Result Date: 11/30/2018 CLINICAL DATA:  Unwitnessed fall. Laceration to the back of the head. EXAM: CT HEAD WITHOUT CONTRAST CT CERVICAL SPINE WITHOUT CONTRAST TECHNIQUE: Multidetector CT imaging of the head and cervical spine was performed following the standard protocol without intravenous contrast. Multiplanar CT image reconstructions of the cervical  spine were also generated. COMPARISON:  04/21/2018 FINDINGS: CT HEAD FINDINGS Brain: Generalized atrophy. Chronic small-vessel ischemic changes of the white matter. Old right parietal vertex cortical and subcortical infarction. No sign of acute infarction, mass lesion, hemorrhage, hydrocephalus or extra-axial collection. Vascular: There is atherosclerotic calcification of the major vessels at the base of the brain. Skull: No skull fracture. Sinuses/Orbits: Chronic mucoperiosteal thickening in the sphenoid sinus. Other: Left parieto-occipital scalp hematoma. CT CERVICAL SPINE FINDINGS Alignment: No traumatic malalignment. Skull base and vertebrae: No evidence of regional acute fracture. 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 Soft tissues and spinal canal: No soft tissue injury. Disc levels: Chronic fusion at C2-3 and C4-5. Chronic degenerative facet arthropathy and spondylosis at C3-4, C5-6, C6-7 and C7-T1. Upper chest: Negative Other: None IMPRESSION: 1. Head CT: No acute or traumatic intracranial finding. Atrophy and chronic small-vessel ischemic changes. Left parieto-occipital scalp hematoma without underlying skull fracture. 2. Cervical spine CT: No acute traumatic finding. 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 Chronic degenerative changes as outlined above. Electronically Signed   By: Nelson Chimes M.D.   On: 12/06/2018 19:33   Dg Pelvis Portable  Result Date: 12/20/2018 CLINICAL DATA:  Unwitnessed fall EXAM: PORTABLE PELVIS 1-2 VIEWS COMPARISON:  None. FINDINGS: SI joints are non widened. Pubic symphysis and rami are intact. No fracture or malalignment. IMPRESSION: No acute osseous abnormality Electronically Signed   By: Donavan Foil M.D.   On: 12/08/2018 17:53        Scheduled Meds: . aspirin EC  81 mg Oral Daily  . ferrous sulfate  325 mg Oral TID WC  . heparin  5,000 Units Subcutaneous Q8H  . Influenza vac split  quadrivalent PF  0.5 mL Intramuscular  Tomorrow-1000  . insulin aspart  0-5 Units Subcutaneous QHS  . insulin aspart  0-9 Units Subcutaneous TID WC  . insulin glargine  8 Units Subcutaneous [id number]  . isosorbide mononitrate  30 mg Oral Daily  . memantine  10 mg Oral BID  . ruxolitinib phosphate  10 mg Oral BID  . tamsulosin  0.4 mg Oral Daily   Continuous Infusions: . sodium chloride 75 mL/hr at 12/14/18 0219     LOS: 2 days    Time spent: 32 minutes    Hosie Poisson, MD [facility] Pager [phone]  If 7PM-7AM, please contact night-coverage [email or web address] Password [facility] 12/14/2018, 10:15 AM

## 2018-12-14 NOTE — Clinical Social Work Note (Signed)
Clinical Social Work Assessment  Patient Details  Name: Aaron Nelson MRN: [id number] Date of Birth: Apr 13, 1930  Date of referral:  12/14/18               Reason for consult:  Facility Placement, Discharge Planning                Permission sought to share information with:  Facility Sport and exercise psychologist, Family Supports Permission granted to share information::  Yes, Verbal Permission Granted  Name::     Wyat Infinger  Agency::  SNF's  Relationship::  Son  Contact Information:  [phone]  Housing/Transportation Living arrangements for the past 2 months:  Single Family Home Source of Information:  Patient, Medical Team, Adult Children Patient Interpreter Needed:  None Criminal Activity/Legal Involvement Pertinent to Current Situation/Hospitalization:  No - Comment as needed Significant Relationships:  Adult Children Lives with:  Adult Children Do you feel safe going back to the place where you live?  Yes Need for family participation in patient care:  Yes (Comment)  Care giving concerns:  PT recommending SNF once medically stable for discharge.   Social Worker assessment / plan:  CSW met with patient. No supports at bedside. CSW introduced role and explained that PT recommendations would be discussed. Patient agreeable to SNF placement. No facility preference but he stated his son may have a preference. He gave CSW permission to call his son. Son, [name], does not have a first preference facility. Will provide bed offer list when available. No further concerns. CSW encouraged patient and his son to contact CSW as needed. CSW will continue to follow patient and his son for support and facilitate discharge to SNF once medically stable.  Employment status:  Retired Nurse, adult PT Recommendations:  [place] / Referral to community resources:  [place]  Patient/Family's Response to care:  Patient and his son  agreeable to SNF placement. [name] supportive and involved in patient's care. Patient and his son appreciated social work intervention.  Patient/Family's Understanding of and Emotional Response to Diagnosis, Current Treatment, and Prognosis:  Patient and his son have a good understanding of the reason for admission and his need for rehab prior to returning home. Patient and his son appear happy with hospital care.  Emotional Assessment Appearance:  Appears stated age Attitude/Demeanor/Rapport:  Engaged, Gracious Affect (typically observed):  Accepting, Appropriate, Calm, Pleasant Orientation:  Oriented to Self, Oriented to Place, Oriented to  Time, Oriented to Situation Alcohol / Substance use:  Never Used Psych involvement (Current and /or in the community):  No (Comment)  Discharge Needs  Concerns to be addressed:  Care Coordination Readmission within the last 30 days:  No Current discharge risk:  Dependent with Mobility Barriers to Discharge:  Continued Medical Work up, [place], LCSW 12/14/2018, 1:02 PM

## 2018-12-15 ENCOUNTER — Other Ambulatory Visit (HOSPITAL_COMMUNITY): Payer: Medicare HMO

## 2018-12-15 ENCOUNTER — Encounter: Payer: Medicare Other | Admitting: Internal Medicine

## 2018-12-15 LAB — BASIC METABOLIC PANEL
Anion gap: 9 (ref 5–15)
BUN: 16 mg/dL (ref 8–23)
CHLORIDE: 104 mmol/L (ref 98–111)
CO2: 23 mmol/L (ref 22–32)
Calcium: 8.9 mg/dL (ref 8.9–10.3)
Creatinine, Ser: 1.58 mg/dL — ABNORMAL HIGH (ref 0.61–1.24)
GFR calc Af Amer: 46 mL/min — ABNORMAL LOW (ref >60)
GFR calc non Af Amer: 39 mL/min — ABNORMAL LOW (ref >60)
Glucose, Bld: 222 mg/dL — ABNORMAL HIGH (ref 70–99)
POTASSIUM: 4.6 mmol/L (ref 3.5–5.1)
Sodium: 136 mmol/L (ref 135–145)

## 2018-12-15 LAB — GLUCOSE, CAPILLARY
Glucose-Capillary: 174 mg/dL — ABNORMAL HIGH (ref 70–99)
Glucose-Capillary: 166 mg/dL — ABNORMAL HIGH (ref 70–99)
Glucose-Capillary: 130 mg/dL — ABNORMAL HIGH (ref 70–99)
Glucose-Capillary: 155 mg/dL — ABNORMAL HIGH (ref 70–99)

## 2018-12-15 LAB — CBC
HCT: 27.8 % — ABNORMAL LOW (ref 39.0–52.0)
HEMOGLOBIN: 8.5 g/dL — AB (ref 13.0–17.0)
MCH: 25.4 pg — ABNORMAL LOW (ref 26.0–34.0)
MCHC: 30.6 g/dL (ref 30.0–36.0)
MCV: 83.2 fL (ref 80.0–100.0)
Platelets: 43 10*3/uL — ABNORMAL LOW (ref 150–400)
RBC: 3.34 MIL/uL — AB (ref 4.22–5.81)
RDW: 22.5 % — ABNORMAL HIGH (ref 11.5–15.5)
WBC: 17.4 10*3/uL — ABNORMAL HIGH (ref 4.0–10.5)
nRBC: 47 % — ABNORMAL HIGH (ref 0.0–0.2)

## 2018-12-15 MED ORDER — HYDRALAZINE HCL 20 MG/ML IJ SOLN
10.0000 mg | Freq: Once | INTRAMUSCULAR | Status: AC
  Administered 2018-12-15: 10 mg via INTRAVENOUS
  Filled 2018-12-15: qty 1

## 2018-12-15 NOTE — Progress Notes (Signed)
PROGRESS NOTE    Aaron Nelson  FVC:[id number] DOB: February 27, 1930 DOA: 12/09/2018 PCP: Charlott Rakes, MD    Brief Narrative: Aaron Nelson is a 84 y.o. male with medical history significant of recurrent syncope and presyncope, sick sinus syndrome status post pacemaker placement, myelodysplasia, hypertension, diabetes, early dementia who lives with his son at home comes in for recurrent falls and syncope.  CT of the head without contrast on his arrival cervical spine shows Atrophy and chronic small-vessel ischemic changes. Left parieto-occipital scalp hematoma without underlying skull fracture. 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  Assessment & Plan:   Principal Problem:   Postural dizziness with presyncope Active Problems:   Essential hypertension   Uncontrolled type 2 diabetes mellitus without complication, with long-term current use of insulin (HCC)   Myelofibrosis (HCC)   Sinus node dysfunction (HCC)   Syncope and collapse   S/P placement of cardiac pacemaker   Fall   Dementia due to Alzheimer's disease ([place])   Pressure injury of skin   Goals of care, counseling/discussion   Palliative care encounter  Recurrent syncope Suspect secondary to hypoglycemic episodes in addition to arrhythmia's and deconditioning EP consult to interrogate the pacemaker. Reviewed, normal functioning.  Avoid hypoglycemic episodes. Decreased the dose of lantus to 10 units daily.  Repeat echocardiogram.  PT evaluation recommending SNF. Pt reports dizziness on getting up from lying position and his orthostatic vital signs were positive. We have stopped the norvasc and imdur. Recheck orthostatic vital signs in am.  TED hoses ordered.     Type 2 diabetes mellitus well controlled CBG (last 3)  Recent Labs    12/14/18 2130 12/15/18 0720 12/15/18 1127  GLUCAP 149* 174* 166*   Hemoglobin A1c 7. Continue with sliding scale insulin and decrease the dose of Lantus to 10 units daily. No  changes in medications.   History of sick sinus syndrome status post pacemaker. EP consulted and pace maker interrogated, normal device function.   History of myelofibrosis Follows up with oncology as outpatient.  Resume with Jakafi 10 mg twice daily.   Essential hypertension Orthostatic again today, we stopped the imdur, ordered TED hoses.  Recheck vital signs tomorrow.    History of dementia Continue with Namenda   Acute renal failure Probably secondary to dehydration. Improving with normal saline fluids.   Leukocytosis Unclear etiology.  Patient does not appear to be toxic at this time.   Anemia and thrombocytopenia Probably secondary to myelofibrosis.  Pressure injury to the left posterior shoulder present on admission, from fall . Intact skin.  Wound care consulted and recommendations given.   DVT prophylaxis: Heparin  code Status: Full code Family Communication: Son at bedside  disposition Plan: Pending clinical improvement and possibly discharge to SNF when bed available.   Consultants:  EP consult.   Procedures: Echocardiogram.   Antimicrobials: None  Subjective: No dizziness today,but reports some neck pain.  No chestpain or sob.   Objective: Vitals:   12/15/18 0035 12/15/18 0425 12/15/18 0500 12/15/18 1130  BP: (!) 180/84 (!) 199/85 (!) 162/59 (!) 154/73  Pulse: 78 78 85 85  Resp: 18 18 16    Temp: 100 F (37.8 C) 99.1 F (37.3 C)  99.5 F (37.5 C)  TempSrc: Oral Oral  Oral  SpO2: 100% 100%  97%  Weight: 73.1 kg     Height:        Intake/Output Summary (Last 24 hours) at 12/15/2018 1409 Last data filed at 12/15/2018 0942 Gross per 24 hour  Intake 2851.95 ml  Output 3200 ml  Net -348.05 ml   Filed Weights   12/13/18 0628 12/14/18 0425 12/15/18 0035  Weight: 71.8 kg 71.6 kg 73.1 kg    Examination:  General exam: laying in the bed, comfortable.  Respiratory system:clear to auscultation bilaterally, no wheezing or rhonchi.    Cardiovascular system: S1 & S2 heard, RRR. No JVD,  Gastrointestinal system: Abdomen is soft, non tender non distended bowel sounds good.  Central nervous system: Alert and oriented to place and person Extremities: Symmetric 5 x 5 power. No pedal edema or cyanosis.  Skin: No rashes, lesions or ulcers Psychiatry:Mood & affect appropriate.     Data Reviewed: I have personally reviewed following labs and imaging studies  CBC: Recent Labs  Lab 12/10/18 1529 12/04/2018 1735 12/13/18 0634 12/15/18 0349  WBC 12.9* 19.6* 13.6* 17.4*  NEUTROABS 2.6 4.3  --   --   HGB 9.6* 9.9* 9.1* 8.5*  HCT 33.0* 35.1* 31.6* 27.8*  MCV 85.5 84.6 83.4 83.2  PLT 46* 50* 44* 43*   Basic Metabolic Panel: Recent Labs  Lab 12/10/18 1529 11/28/2018 1735 12/13/18 0634 12/14/18 0521 12/15/18 0349  NA 140 138 140 138 136  K 4.9 4.5 3.8 3.9 4.6  CL 106 105 108 108 104  CO2 23 23 24 24 23   GLUCOSE 211* 68* 107* 114* 222*  BUN 24* 18 13 10 16   CREATININE 1.84* 1.81* 1.57* 1.43* 1.58*  CALCIUM 9.0 9.3 8.9 8.7* 8.9   GFR: Estimated Creatinine Clearance: 30.8 mL/min (A) (by C-G formula based on SCr of 1.58 mg/dL (H)). Liver Function Tests: Recent Labs  Lab 12/10/18 1529 12/05/2018 1735 12/13/18 0634  AST 56* 64* 57*  ALT 27 28 25   ALKPHOS 94 101 87  BILITOT 1.6* 1.3* 1.3*  PROT 7.0 7.8 7.1  ALBUMIN 3.9 4.0 3.5   No results for input(s): LIPASE, AMYLASE in the last 168 hours. No results for input(s): AMMONIA in the last 168 hours. Coagulation Profile: Recent Labs  Lab 11/24/2018 1735  INR 1.16   Cardiac Enzymes: Recent Labs  Lab 11/25/2018 1735  TROPONINI <0.03   BNP (last 3 results) No results for input(s): PROBNP in the last 8760 hours. HbA1C: Recent Labs    12/13/18 0634  HGBA1C 7.0*   CBG: Recent Labs  Lab 12/14/18 1149 12/14/18 1641 12/14/18 2130 12/15/18 0720 12/15/18 1127  GLUCAP 152* 164* 149* 174* 166*   Lipid Profile: No results for input(s): CHOL, HDL, LDLCALC,  TRIG, CHOLHDL, LDLDIRECT in the last 72 hours. Thyroid Function Tests: No results for input(s): TSH, T4TOTAL, FREET4, T3FREE, THYROIDAB in the last 72 hours. Anemia Panel: No results for input(s): VITAMINB12, FOLATE, FERRITIN, TIBC, IRON, RETICCTPCT in the last 72 hours. Sepsis Labs: No results for input(s): PROCALCITON, LATICACIDVEN in the last 168 hours.  No results found for this or any previous visit (from the past 240 hour(s)).       Radiology Studies: No results found.      Scheduled Meds: . aspirin EC  81 mg Oral Daily  . feeding supplement (GLUCERNA SHAKE)  237 mL Oral TID BM  . ferrous sulfate  325 mg Oral TID WC  . heparin  5,000 Units Subcutaneous Q8H  . insulin aspart  0-5 Units Subcutaneous QHS  . insulin aspart  0-9 Units Subcutaneous TID WC  . insulin glargine  10 Units Subcutaneous BH-q7a  . memantine  10 mg Oral BID  . multivitamin with minerals  1 tablet Oral Daily  .  ruxolitinib phosphate  10 mg Oral BID  . tamsulosin  0.4 mg Oral Daily   Continuous Infusions:    LOS: 3 days    Time spent: 30 minutes    Hosie Poisson, MD [facility] Pager [phone]  If 7PM-7AM, please contact night-coverage [email or web address] Password [email or web address] 12/15/2018, 2:09 PM

## 2018-12-15 NOTE — Progress Notes (Signed)
Chaplain responded to spiritual care consult.  Patient ready to die.  Visited with patient, and his son [name], Tax inspector of presence. As it is Christmas Eve, it may be hard to follow up.  [place]  Pager [phone]

## 2018-12-15 NOTE — Progress Notes (Signed)
Physical Therapy Treatment Patient Details Name: Aaron Nelson MRN: [id number] DOB: [birth year] Today's Date: 12/15/2018    History of Present Illness Pt is an 84 y.o. male admitted 12/05/2018 after falling at home hitting his head; pt reports recurrent episodes of dizziness. Imaging without acute or traumatic finding; subacute L rib fx. Worked up for orthostatic hypotension. Pacemaker interrogation normal. Of note, recent admission for fall. PMH includes recurrent syncope, sick sinus syndrome s/p pacemaker, early dementia, HTN, DM.   PT Comments    Pt slowly progressing with mobility. Able to stand and amb to recliner with HHA and minA to maintain balance; remains limited by c/o dizziness and fatigue. Once seated, BP 121/79. Patient shared, "I'm ready to die and be with the Lord." Assisted pt in leaving messages for his pastor and son; pt interested in speaking with Chaplain about this and potentially filling out DNR paperwork (RN notified and order placed).     Follow Up Recommendations  SNF;Supervision for mobility/OOB     Equipment Recommendations  3in1 (PT)    Recommendations for Other Services       Precautions / Restrictions Precautions Precautions: Fall Precaution Comments: watch Orthostatic BPs Restrictions Weight Bearing Restrictions: No    Mobility  Bed Mobility Overal bed mobility: Needs Assistance Bed Mobility: Supine to Sit     Supine to sit: Min guard     General bed mobility comments: Significant increased time and effort, no physical assist required; cues to initiate task  Transfers Overall transfer level: Needs assistance Equipment used: 1 person hand held assist Transfers: Sit to/from Stand Sit to Stand: Min guard            Ambulation/Gait Ambulation/Gait assistance: Min assist Gait Distance (Feet): 2 Feet Assistive device: 1 person hand held assist Gait Pattern/deviations: Step-to pattern;Trunk flexed Gait velocity: Decreaseed Gait velocity  interpretation: <1.8 ft/sec, indicate of risk for recurrent falls General Gait Details: Took steps from bed to recliner with minA and HHA to maintain balance. Pt c/o dizziness; BP 121/79 upon sitting in chair   Stairs             Wheelchair Mobility    Modified Rankin (Stroke Patients Only)       Balance Overall balance assessment: Needs assistance Sitting-balance support: No upper extremity supported Sitting balance-Leahy Scale: Good     Standing balance support: Bilateral upper extremity supported Standing balance-Leahy Scale: Poor Standing balance comment: Reliant on UE support                            Cognition Arousal/Alertness: Awake/alert Behavior During Therapy: WFL for tasks assessed/performed Overall Cognitive Status: History of cognitive impairments - at baseline                                 General Comments: Interacting appropriately, although slowed processing and delayed problem solving noted      Exercises      General Comments General comments (skin integrity, edema, etc.): Pt reports dizziness has gotten worse although hypotension has improved. Increased time spent discussing fact that pt shared "I'm ready to die" - pt required assist to dial bishop's and son's phone numbers in order to leave voicemail; informed pt of chaplain services and he would like to talk to someone (RN notified and placed order)      Pertinent Vitals/Pain Pain Assessment: No/denies pain    Home Living  Prior Function            PT Goals (current goals can now be found in the care plan section) Acute Rehab PT Goals Patient Stated Goal: "I'm ready to die and be with the Lord" PT Goal Formulation: With patient Time For Goal Achievement: 12/27/18 Progress towards PT goals: Progressing toward goals    Frequency    Min 2X/week      PT Plan Current plan remains appropriate    Co-evaluation               AM-PAC PT "6 Clicks" Mobility   Outcome Measure  Help needed turning from your back to your side while in a flat bed without using bedrails?: A Little Help needed moving from lying on your back to sitting on the side of a flat bed without using bedrails?: A Little Help needed moving to and from a bed to a chair (including a wheelchair)?: A Little Help needed standing up from a chair using your arms (e.g., wheelchair or bedside chair)?: A Little Help needed to walk in hospital room?: A Little Help needed climbing 3-5 steps with a railing? : A Lot 6 Click Score: 17    End of Session   Activity Tolerance: Treatment limited secondary to medical complications (Comment);Patient limited by fatigue Patient left: in chair;with call bell/phone within reach;with chair alarm set Nurse Communication: Mobility status PT Visit Diagnosis: Unsteadiness on feet (R26.81);Other abnormalities of gait and mobility (R26.89);Dizziness and giddiness (R42)     Time: 5993-5701 PT Time Calculation (min) (ACUTE ONLY): 24 min  Charges:  $Therapeutic Activity: 8-22 mins                    Mabeline Caras, PT, DPT Acute Rehabilitation Services  Pager [phone] Office [phone]  Derry Lory 12/15/2018, 2:17 PM

## 2018-12-15 NOTE — Plan of Care (Signed)
  Problem: Activity: Goal: Risk for activity intolerance will decrease Outcome: Progressing   Problem: Nutrition: Goal: Adequate nutrition will be maintained Outcome: Progressing   Problem: Pain Managment: Goal: General experience of comfort will improve Outcome: Progressing   

## 2018-12-15 NOTE — Care Management Important Message (Signed)
Important Message  Patient Details  Name: Aaron Nelson MRN: [id number] Date of Birth: [birth year]   Medicare Important Message Given:  Yes    Ashon Rosenberg 12/15/2018, 3:21 PM

## 2018-12-15 NOTE — Progress Notes (Signed)
Oncology Short Note  Got called by pathologist regarding peripheral blood smear showing increased blasts. Would recommend consulting IR for Bone marrow aspiration and biopsy to evaluate for possibility of AML transformation of Jak2 +ve myelofibrosis -- if family/patient agreeable to this testing. If AML noted -- patient will have very poor prognosis in the back ground of PMF.  Sullivan Lone MD MS

## 2018-12-16 ENCOUNTER — Inpatient Hospital Stay (HOSPITAL_COMMUNITY): Payer: Medicare HMO

## 2018-12-16 DIAGNOSIS — Z978 Presence of other specified devices: Secondary | ICD-10-CM

## 2018-12-16 DIAGNOSIS — G309 Alzheimer's disease, unspecified: Secondary | ICD-10-CM

## 2018-12-16 DIAGNOSIS — I10 Essential (primary) hypertension: Secondary | ICD-10-CM

## 2018-12-16 DIAGNOSIS — Z7189 Other specified counseling: Secondary | ICD-10-CM

## 2018-12-16 DIAGNOSIS — R42 Dizziness and giddiness: Secondary | ICD-10-CM

## 2018-12-16 DIAGNOSIS — R55 Syncope and collapse: Secondary | ICD-10-CM

## 2018-12-16 DIAGNOSIS — F0280 Dementia in other diseases classified elsewhere without behavioral disturbance: Secondary | ICD-10-CM

## 2018-12-16 LAB — CUP PACEART REMOTE DEVICE CHECK
Battery Remaining Longevity: 124 mo
Battery Remaining Percentage: 95.5 %
Battery Voltage: 3.02 V
Brady Statistic AP VS Percent: 9.2 %
Brady Statistic AS VS Percent: 91 %
Brady Statistic RA Percent Paced: 9.1 %
Brady Statistic RV Percent Paced: 1 %
Date Time Interrogation Session: 20191221224359
Implantable Lead Implant Date: 20180907
Implantable Lead Implant Date: 20180907
Implantable Lead Location: 753859
Implantable Lead Location: 753860
Implantable Lead Model: 1948
Implantable Pulse Generator Implant Date: 20180907
Lead Channel Impedance Value: 480 Ohm
Lead Channel Pacing Threshold Amplitude: 0.75 V
Lead Channel Pacing Threshold Amplitude: 0.75 V
Lead Channel Pacing Threshold Pulse Width: 0.4 ms
Lead Channel Pacing Threshold Pulse Width: 0.4 ms
Lead Channel Sensing Intrinsic Amplitude: 12 mV
Lead Channel Sensing Intrinsic Amplitude: 2.2 mV
Lead Channel Setting Pacing Amplitude: 2 V
Lead Channel Setting Pacing Amplitude: 2.5 V
Lead Channel Setting Pacing Pulse Width: 0.4 ms
MDC IDC MSMT LEADCHNL RV IMPEDANCE VALUE: 660 Ohm
MDC IDC SET LEADCHNL RV SENSING SENSITIVITY: 2 mV
MDC IDC STAT BRADY AP VP PERCENT: 1 %
MDC IDC STAT BRADY AS VP PERCENT: 1 %
Pulse Gen Model: 2272
Pulse Gen Serial Number: 8939912

## 2018-12-16 LAB — RENAL FUNCTION PANEL
Albumin: 3.3 g/dL — ABNORMAL LOW (ref 3.5–5.0)
Anion gap: 12 (ref 5–15)
BUN: 16 mg/dL (ref 8–23)
CHLORIDE: 101 mmol/L (ref 98–111)
CO2: 20 mmol/L — ABNORMAL LOW (ref 22–32)
Calcium: 8.6 mg/dL — ABNORMAL LOW (ref 8.9–10.3)
Creatinine, Ser: 1.47 mg/dL — ABNORMAL HIGH (ref 0.61–1.24)
GFR calc Af Amer: 50 mL/min — ABNORMAL LOW (ref >60)
GFR calc non Af Amer: 43 mL/min — ABNORMAL LOW (ref >60)
Glucose, Bld: 354 mg/dL — ABNORMAL HIGH (ref 70–99)
Phosphorus: 4 mg/dL (ref 2.5–4.6)
Potassium: 5.1 mmol/L (ref 3.5–5.1)
Sodium: 133 mmol/L — ABNORMAL LOW (ref 135–145)

## 2018-12-16 LAB — CBC WITH DIFFERENTIAL/PLATELET
Band Neutrophils: 21 %
Basophils Absolute: 1.4 10*3/uL — ABNORMAL HIGH (ref 0.0–0.1)
Basophils Relative: 7 %
Blasts: 14 %
Eosinophils Absolute: 0.4 10*3/uL (ref 0.0–0.5)
Eosinophils Relative: 2 %
HCT: 23.9 % — ABNORMAL LOW (ref 39.0–52.0)
HEMOGLOBIN: 7.1 g/dL — AB (ref 13.0–17.0)
Lymphocytes Relative: 14 %
Lymphs Abs: 2.8 10*3/uL (ref 0.7–4.0)
MCH: 24.2 pg — ABNORMAL LOW (ref 26.0–34.0)
MCHC: 29.7 g/dL — ABNORMAL LOW (ref 30.0–36.0)
MCV: 81.6 fL (ref 80.0–100.0)
MONO ABS: 3.9 10*3/uL — AB (ref 0.1–1.0)
Metamyelocytes Relative: 6 %
Monocytes Relative: 20 %
Myelocytes: 2 %
Neutro Abs: 8.5 10*3/uL — ABNORMAL HIGH (ref 1.7–7.7)
Neutrophils Relative %: 13 %
Other: 0 %
Platelets: 48 10*3/uL — ABNORMAL LOW (ref 150–400)
Promyelocytes Relative: 1 %
RBC: 2.93 MIL/uL — ABNORMAL LOW (ref 4.22–5.81)
RDW: 22.4 % — ABNORMAL HIGH (ref 11.5–15.5)
WBC: 19.7 10*3/uL — ABNORMAL HIGH (ref 4.0–10.5)
nRBC: 111 /100 WBC — ABNORMAL HIGH
nRBC: 35.9 % — ABNORMAL HIGH (ref 0.0–0.2)

## 2018-12-16 LAB — PREPARE PLATELET PHERESIS: Unit division: 0

## 2018-12-16 LAB — BLOOD GAS, ARTERIAL
Acid-base deficit: 3.9 mmol/L — ABNORMAL HIGH (ref 0.0–2.0)
Bicarbonate: 19.3 mmol/L — ABNORMAL LOW (ref 20.0–28.0)
Drawn by: 44166
FIO2: 21
O2 SAT: 96.5 %
Patient temperature: 98.6
pCO2 arterial: 27.7 mmHg — ABNORMAL LOW (ref 32.0–48.0)
pH, Arterial: 7.457 — ABNORMAL HIGH (ref 7.350–7.450)
pO2, Arterial: 76.1 mmHg — ABNORMAL LOW (ref 83.0–108.0)

## 2018-12-16 LAB — MRSA PCR SCREENING: MRSA by PCR: NEGATIVE

## 2018-12-16 LAB — GLUCOSE, CAPILLARY
Glucose-Capillary: 300 mg/dL — ABNORMAL HIGH (ref 70–99)
Glucose-Capillary: 331 mg/dL — ABNORMAL HIGH (ref 70–99)

## 2018-12-16 LAB — BPAM PLATELET PHERESIS
Blood Product Expiration Date: 201912252359
ISSUE DATE / TIME: 201912250709
Unit Type and Rh: 8400

## 2018-12-16 LAB — MAGNESIUM: Magnesium: 1.8 mg/dL (ref 1.7–2.4)

## 2018-12-16 LAB — TRIGLYCERIDES: Triglycerides: 64 mg/dL (ref <150)

## 2018-12-16 MED ORDER — GLYCOPYRROLATE 0.2 MG/ML IJ SOLN: 0.2000 mg | INTRAMUSCULAR | Status: DC | PRN

## 2018-12-16 MED ORDER — SODIUM CHLORIDE 0.9% IV SOLUTION: Freq: Once | INTRAVENOUS | Status: DC

## 2018-12-16 MED ORDER — ETOMIDATE 2 MG/ML IV SOLN
0.3000 mg/kg | Freq: Once | INTRAVENOUS | Status: AC
  Administered 2018-12-16: 20.98 mg via INTRAVENOUS

## 2018-12-16 MED ORDER — PROPOFOL 1000 MG/100ML IV EMUL
0.0000 ug/kg/min | INTRAVENOUS | Status: DC
  Administered 2018-12-16: 10 ug/kg/min via INTRAVENOUS
  Filled 2018-12-16: qty 100

## 2018-12-16 MED ORDER — ORAL CARE MOUTH RINSE: 15.0000 mL | OROMUCOSAL | Status: DC

## 2018-12-16 MED ORDER — ACETAMINOPHEN 650 MG RE SUPP: 650.0000 mg | Freq: Four times a day (QID) | RECTAL | Status: DC | PRN

## 2018-12-16 MED ORDER — AMMONIA AROMATIC IN INHA
0.3000 mL | Freq: Once | RESPIRATORY_TRACT | Status: DC
  Filled 2018-12-16: qty 10

## 2018-12-16 MED ORDER — FENTANYL CITRATE (PF) 100 MCG/2ML IJ SOLN: 50.0000 ug | INTRAMUSCULAR | Status: DC | PRN

## 2018-12-16 MED ORDER — LABETALOL HCL 5 MG/ML IV SOLN
10.0000 mg | Freq: Once | INTRAVENOUS | Status: AC
  Administered 2018-12-16: 10 mg via INTRAVENOUS

## 2018-12-16 MED ORDER — HYDRALAZINE HCL 20 MG/ML IJ SOLN: 2.0000 mg | Freq: Once | INTRAMUSCULAR | Status: DC

## 2018-12-16 MED ORDER — MIDAZOLAM HCL 2 MG/2ML IJ SOLN
INTRAMUSCULAR | Status: AC
  Administered 2018-12-16: 4 mg via INTRAVENOUS
  Filled 2018-12-16: qty 2

## 2018-12-16 MED ORDER — POLYVINYL ALCOHOL 1.4 % OP SOLN
1.0000 [drp] | Freq: Four times a day (QID) | OPHTHALMIC | Status: DC | PRN
  Filled 2018-12-16: qty 15

## 2018-12-16 MED ORDER — FENTANYL CITRATE (PF) 100 MCG/2ML IJ SOLN
100.0000 ug | Freq: Once | INTRAMUSCULAR | Status: AC
  Administered 2018-12-16: 100 ug via INTRAVENOUS

## 2018-12-16 MED ORDER — LORAZEPAM 2 MG/ML IJ SOLN
2.0000 mg | INTRAMUSCULAR | Status: DC | PRN
  Administered 2018-12-16 – 2018-12-18 (×2): 2 mg via INTRAVENOUS
  Filled 2018-12-16 (×2): qty 1

## 2018-12-16 MED ORDER — GLYCOPYRROLATE 1 MG PO TABS
1.0000 mg | ORAL_TABLET | ORAL | Status: DC | PRN
  Filled 2018-12-16: qty 1

## 2018-12-16 MED ORDER — MIDAZOLAM HCL 2 MG/2ML IJ SOLN
INTRAMUSCULAR | Status: AC
  Filled 2018-12-16: qty 2

## 2018-12-16 MED ORDER — DEXTROSE 5 % IV SOLN: INTRAVENOUS | Status: DC

## 2018-12-16 MED ORDER — FENTANYL CITRATE (PF) 100 MCG/2ML IJ SOLN
INTRAMUSCULAR | Status: AC
  Filled 2018-12-16: qty 2

## 2018-12-16 MED ORDER — DIPHENHYDRAMINE HCL 50 MG/ML IJ SOLN: 25.0000 mg | INTRAMUSCULAR | Status: DC | PRN

## 2018-12-16 MED ORDER — INSULIN ASPART 100 UNIT/ML ~~LOC~~ SOLN: 0.0000 [IU] | SUBCUTANEOUS | Status: DC

## 2018-12-16 MED ORDER — CLEVIDIPINE BUTYRATE 0.5 MG/ML IV EMUL
0.0000 mg/h | INTRAVENOUS | Status: DC
  Administered 2018-12-16: 4 mg/h via INTRAVENOUS
  Administered 2018-12-16: 1 mg/h via INTRAVENOUS
  Filled 2018-12-16 (×2): qty 50

## 2018-12-16 MED ORDER — ACETAMINOPHEN 325 MG PO TABS: 650.0000 mg | ORAL_TABLET | Freq: Four times a day (QID) | ORAL | Status: DC | PRN

## 2018-12-16 MED ORDER — CHLORHEXIDINE GLUCONATE 0.12% ORAL RINSE (MEDLINE KIT)
15.0000 mL | Freq: Two times a day (BID) | OROMUCOSAL | Status: DC
  Administered 2018-12-16: 15 mL via OROMUCOSAL

## 2018-12-16 MED ORDER — PANTOPRAZOLE SODIUM 40 MG IV SOLR
40.0000 mg | Freq: Every day | INTRAVENOUS | Status: DC
  Administered 2018-12-16: 40 mg via INTRAVENOUS
  Filled 2018-12-16: qty 40

## 2018-12-16 MED ORDER — ROCURONIUM BROMIDE 50 MG/5ML IV SOLN
100.0000 mg | Freq: Once | INTRAVENOUS | Status: AC
  Administered 2018-12-16: 100 mg via INTRAVENOUS
  Filled 2018-12-16: qty 10

## 2018-12-16 MED ORDER — MIDAZOLAM HCL 2 MG/2ML IJ SOLN
4.0000 mg | Freq: Once | INTRAMUSCULAR | Status: AC
  Administered 2018-12-16: 4 mg via INTRAVENOUS

## 2018-12-16 MED ORDER — LORAZEPAM 2 MG/ML IJ SOLN: 2.0000 mg | INTRAMUSCULAR | Status: DC | PRN

## 2018-12-16 MED ORDER — DOCUSATE SODIUM 50 MG/5ML PO LIQD: 100.0000 mg | Freq: Two times a day (BID) | ORAL | Status: DC | PRN

## 2018-12-16 MED ORDER — MORPHINE SULFATE (PF) 2 MG/ML IV SOLN
2.0000 mg | INTRAVENOUS | Status: DC | PRN
  Administered 2018-12-16 – 2018-12-17 (×3): 2 mg via INTRAVENOUS
  Administered 2018-12-17: 4 mg via INTRAVENOUS
  Administered 2018-12-17 – 2018-12-18 (×5): 2 mg via INTRAVENOUS
  Filled 2018-12-16 (×5): qty 1
  Filled 2018-12-16: qty 2
  Filled 2018-12-16 (×2): qty 1
  Filled 2018-12-16: qty 2

## 2018-12-16 MED ORDER — BISACODYL 10 MG RE SUPP: 10.0000 mg | Freq: Every day | RECTAL | Status: DC | PRN

## 2018-12-16 NOTE — Progress Notes (Signed)
Shift event: Pt was admitted 4 days ago secondary to fall and syncopal episode. He has had orthostatic hypotension during stay. Tonight (earlier), RN paged NP that pt's systolic BP was 062. NP ordered orthostatics because NP didn't want to drop his BP quickly due to history. Orthostatics were positive upon admission and BP meds had been held by attending. Unable to do BPs because RN found pt weak. Hydralazine 2mg  then ordered. When RN checked pt again, he was basically unresponsive and not moving the left side. RRRN to bedside and NP followed. S: unable to participate in ROS due to mental status. Last seen at baseline around 0330.  O: Very poor appearing elderly male in NAD. VS BP 170s. CBG 300. HR 90s. RR 20 with decreased gag reflex. Afebrile. Pupils sluggish. Pt barely opens eyes to pain and aggressive tactile stimulation. Withdraws to pain on the right and does not move his left side. Non verbal.  A/P: 1. Change in baseline mental status (dementia at baseline)-was alert and talking earlier in shift. CT head stat shows large right sided bleed and left sided SDH. Neurology saw pt in the [facility]. Neuro will take over as attending now. Neuro to call [facility]U. Stopped Heparin SQ for VTE. Changed to SCDs. Clev started for BP after labetalol failed. Target BP less than 140.  2. AMS with poor protection of airway-NP called son but he didn't want to make a decision about code status without calling other family members. He is on his way to the hospital. However, we ended up calling PCCM and pt was intubated due to poor protection of airway. 3. Code status-as above. Still FULL at this time. It appears attending had written for palliative care consult. CRITICAL CARE Performed by: Gardiner Barefoot   Total critical care time: 90 minutes  Critical care time was exclusive of separately billable procedures and treating other patients.  Critical care was necessary to treat or prevent imminent or life-threatening  deterioration.  Critical care was time spent personally by me on the following activities: development of treatment plan with patient and/or surrogate as well as nursing, discussions with consultants, evaluation of patient's response to treatment, examination of patient, obtaining history from patient or surrogate, ordering and performing treatments and interventions, ordering and review of laboratory studies, ordering and review of radiographic studies, pulse oximetry and re-evaluation of patient's condition.

## 2018-12-16 NOTE — Progress Notes (Signed)
Patient BP is elevated 201/89. [facility] has been paged. Will continue to monitor.

## 2018-12-16 NOTE — Progress Notes (Signed)
Met with patient's family at bedside.  He was clear that Aaron Nelson would not want to be supported on a breathing machine.  Confirmed DNR status, no escalation of care.  Remainder of family should be arriving later this morning.  At that point they will transition to comfort care with removal of ventilatory support.  Discuss the process of this with the family.  Chesley Mires, MD [facility] Pulmonary/Critical Care 12/16/2018, 7:34 AM

## 2018-12-16 NOTE — Progress Notes (Signed)
RT assisted CCM MD with pt intubation. Pt intubated with a 7.5 using glyde mac 4. Pt intubation was uneventful. Pt on 8cc of 544m with set rate of 16, 40 % FIO2 and PEEP of 5. RT will continue to monitor.

## 2018-12-16 NOTE — Significant Event (Addendum)
Rapid Response Event Note  Overview: Neurologic  Initial Focused Assessment: Called by RN at [phone] about patient's decline in mental status. Per RN, patient was last seen normal at 3:30. RN went in to recheck VS (patient was hypertensive). RN was not able to illicit a response from the patient. Blood sugar 300 and patient did not received any sedating medications. Upon arrival, patient was somnolent, pupils sluggish 3 mm bilaterally, briefly opened eyes to painful stimuli just once, very weak gag, withdraws from pain on right side but did not withdraw from pain on the left. + Snoring respirations at times, currently protecting his airway, saturations were 100% RA. SBP 180s, HR 90s, RR 26-28. [facility] NP was paged and came to the bedside.   Interventions: - STAT ABG, normal ABG - Code Stroke called at 0525. Taken for STAT HEAD CT. + Acute RIGHT temporoparietal hemorrhage, 8 mm RIGHT to LEFT midline shift with early suspected LEFT ventricle entrapment. RIGHT subdural extension measuring to 10 mm. Small volume dependent LEFT occipital horn blood products are likely redistributed, less likely intraventricular extension of hemorrhage. - SBP in the lows 200s in CT. I treated BP with a total Labetalol 20 mg IV (10mg  IV x 2). Decision made to take patient to NTICU.  [facility] NP did call the family, family needed time in making decisions and were on the way to the hospital. - Once in NTICU, SBP was treated again with Labetalol 20 mg IV (10 mg IV x 2). Cleviprex was ordered. Patient's mental status worsening, RR had dropped, saturations were holding but patient was not protecting his airway. PCCM was consulted and patient was successfully intubated.  - I placed 2 PIVs upon arrival to [place].  Plan of Care:  - Transferred from [place]  Event Summary:   at    Call Time [place] End Time [place], [place]

## 2018-12-16 NOTE — Consult Note (Signed)
NAME:  Aaron Nelson, MRN:  [id number], DOB:  1930/06/26, LOS: 4 ADMISSION DATE:  12/10/2018, CONSULTATION DATE:  12/16/2018 REFERRING MD:  Dr. Cheral Marker, CHIEF COMPLAINT:  AMS  Brief History   42 yoM admitted on 12/21 with syncope and fall.  Overnight was confused but MAE.  Around 0330 noted to be more hypertensive with new focal right sided deficits.  Code stroke called 0526.  Found on CT head to have new R IPH and R SDH.  Transferred to ICU.  Patient not protecting his airway.  PCCM called for further management.   History of present illness   HPI obtained from medical chart review as patient is unresponsive.   84 year old male with history of recurrent syncope, sick sinus syndrome s/p pacemaker, myelodysplasia, HTN, DM, early dementia who was admitted to [facility] on 12/21 for recurrent falls, syncope, and left parietal hematoma and laceration. On home ASA only.  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  During hospitalization, recurrent syncope thought to be related to hypoglycemic episodes and DM meds adjusted.  Pacemaker was evaluated and normal functioning.  Was being set up for SNF placement.   Early morning of 12/25, patient had been confused but moving all extremities.  Then noted to have decreasing mental status and becoming acutely hypertensive.  Then found to have acute focal left sided deficits.  Code stroke called at 0526.  CT head noted acute right IPH and SDH. Additionally, patient not able to protect his airway and family could not make a decision over the phone.  Patient transferred to ICU for intubation and further management by PCCM.    Past Medical History  Recurrent syncope, sick sinus syndrome s/p pacemaker, myelodysplasia, HTN, DM, early dementia.    Significant Hospital Events   12/21 Admitted 12/25  Decompensated/ ICU/ intubated  Consults:  Neurology  Procedures:  12/25 ETT  Significant Diagnostic Tests:    12/21 CTH / cervical >> 1. Head CT: No acute or traumatic intracranial finding. Atrophy and chronic small-vessel ischemic changes. Left parieto-occipital scalp hematoma without underlying skull fracture. 2. Cervical spine CT: No acute traumatic finding. 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 Chronic degenerative changes as outlined above.  12/25 CTH >> 1. Acute RIGHT temporoparietal hemorrhage, dominant component is 62 cc. 8 mm RIGHT to LEFT midline shift with early suspected LEFT ventricle entrapment. RIGHT subdural extension measuring to 10 mm. 2. Small volume dependent LEFT occipital horn blood products are likely redistributed, less likely intraventricular extension of hemorrhage.  Micro Data:   Antimicrobials:   Interim history/subjective:  Being started on cleviprex   Objective   Blood pressure (!) 209/90, pulse 99, temperature 98.5 F (36.9 C), temperature source Oral, resp. rate 18, height '5\' 6"'  (1.676 m), weight 69.9 kg, SpO2 100 %.   Intake/Output Summary (Last 24 hours) at 12/16/2018 5537 Last data filed at 12/16/2018 0413 Gross per 24 hour  Intake 2274.95 ml  Output 1225 ml  Net 1049.95 ml   Filed Weights   12/14/18 0425 12/15/18 0035 12/16/18 0406  Weight: 71.6 kg 73.1 kg 69.9 kg   Examination: General:  Elderly male lying in bed unresponsive HEENT: MM pink/dry, poor dentition, pupils 3/ reactive/ responsive, staples intact noted behind left ear Neuro: unresponsive, does not respond to noxious stimuli, some spont movement of RUE, no gag CV: paced, pacer left upper chest, +peripheral pulses PULM: sonorous respirations, not much air movement GI:  soft, non-tender,  bs +  Extremities: warm/dry, no LE edema  Skin: no rashes   Resolved Hospital Problem list    Assessment & Plan:  Large right temporoparetal acute hemorrhage with subdural extension P:  Appreciate neurology recs Goal SBP < 140 with cleviprex Platelets ordered Stopping ASA and  heparin SQ for VTE recs to discuss goals of care with family on arrival.  If family wants ongoing aggressive care will need to consider hypertonic saline and NSGY eval w/ repeat imaging in 12 hrs Frequent neuro checks PAD protocol with propofol and prn fentanyl if needed for RASS 0/-1, bowel regimen in place  Acute respiratory insufficiency related to above P:  Intubate now Full MV support, PRVC 8 cc/kg, rate 16 Wean FiO2/ peep for sats > 92% CXR stat and ABG in 1 hour VAP measures   Hypertensive crisis P:  Tele monitoring cleviprex as above for goal SBP < 140  Thrombocytopenia w/ MDS P:  Platelets ordered Pending am labs and trend Oncology following- peripheral smear showing increased blast, recs to consult IR for bone marrow aspiration for concern of transformation to AML  DM with hypoglycemic episodes  P:  CBG q 4 SSI sensitive lantus 10 units daily   Syncopal Sick sinus syndrome w/ pacemaker - interrogation by EP normal P:  Tele monitoring  Best practice:  Diet: NPO Pain/Anxiety/Delirium protocol (if indicated): Propofol / prn fentanyl VAP protocol (if indicated): yes DVT prophylaxis: SCDs only GI prophylaxis: protonix  Glucose control: CBG q 4 Mobility: BR  Code Status: Full   Family Communication: no family at bedside.  Will need goals of care discussion when family arrives to determine path of care Disposition:   Labs   CBC: Recent Labs  Lab 12/10/18 1529 12/17/2018 1735 12/13/18 0634 12/15/18 0349  WBC 12.9* 19.6* 13.6* 17.4*  NEUTROABS 2.6 4.3  --   --   HGB 9.6* 9.9* 9.1* 8.5*  HCT 33.0* 35.1* 31.6* 27.8*  MCV 85.5 84.6 83.4 83.2  PLT 46* 50* 44* 43*    Basic Metabolic Panel: Recent Labs  Lab 12/10/18 1529 11/22/2018 1735 12/13/18 0634 12/14/18 0521 12/15/18 0349  NA 140 138 140 138 136  K 4.9 4.5 3.8 3.9 4.6  CL 106 105 108 108 104  CO2 '23 23 24 24 23  ' GLUCOSE 211* 68* 107* 114* 222*  BUN 24* '18 13 10 16  ' CREATININE 1.84* 1.81*  1.57* 1.43* 1.58*  CALCIUM 9.0 9.3 8.9 8.7* 8.9   GFR: Estimated Creatinine Clearance: 30.8 mL/min (A) (by C-G formula based on SCr of 1.58 mg/dL (H)). Recent Labs  Lab 12/10/18 1529 12/08/2018 1735 12/13/18 0634 12/15/18 0349  WBC 12.9* 19.6* 13.6* 17.4*    Liver Function Tests: Recent Labs  Lab 12/10/18 1529 11/22/2018 1735 12/13/18 0634  AST 56* 64* 57*  ALT '27 28 25  ' ALKPHOS 94 101 87  BILITOT 1.6* 1.3* 1.3*  PROT 7.0 7.8 7.1  ALBUMIN 3.9 4.0 3.5   No results for input(s): LIPASE, AMYLASE in the last 168 hours. No results for input(s): AMMONIA in the last 168 hours.  ABG    Component Value Date/Time   PHART 7.457 (H) 12/16/2018 0515   PCO2ART 27.7 (L) 12/16/2018 0515   PO2ART 76.1 (L) 12/16/2018 0515   HCO3 19.3 (L) 12/16/2018 0515   ACIDBASEDEF 3.9 (H) 12/16/2018 0515   O2SAT 96.5 12/16/2018 0515     Coagulation Profile: Recent Labs  Lab 12/03/2018 1735  INR 1.16    Cardiac Enzymes: Recent Labs  Lab  12/05/2018 1735  TROPONINI <0.03    HbA1C: Hemoglobin A1C  Date/Time Value Ref Range Status  08/17/2018 10:23 AM 7.5 (A) 4.0 - 5.6 % Final  04/08/2018 09:33 AM 11.4  Final   Hgb A1c MFr Bld  Date/Time Value Ref Range Status  12/13/2018 06:34 AM 7.0 (H) 4.8 - 5.6 % Final    Comment:    (NOTE) Pre diabetes:          5.7%-6.4% Diabetes:              >6.4% Glycemic control for   <7.0% adults with diabetes     CBG: Recent Labs  Lab 12/15/18 0720 12/15/18 1127 12/15/18 1648 12/15/18 2121 12/16/18 0452  GLUCAP 174* 166* 130* 155* 300*    Review of Systems:   Unable   Past Medical History  He,  has a past medical history of Diabetes mellitus without complication ([place]), Hypertension, Myelodysplasia (myelodysplastic syndrome) ([place]), S/P placement of cardiac pacemaker (08/30/2017), Sinus arrest, Syncope, and Syncope and collapse (08/30/2017).   Surgical History    Past Surgical History:  Procedure Laterality Date  . BONE MARROW BIOPSY  2017  .  CATARACT EXTRACTION    . EYE SURGERY    . LOOP RECORDER REMOVAL N/A 08/29/2017   Procedure: LOOP RECORDER REMOVAL;  Surgeon: Deboraha Sprang, MD;  Location: [facility];  Service: Cardiovascular;  Laterality: N/A;  . PACEMAKER IMPLANT Left 08/29/2017   St Jude generator  . PACEMAKER IMPLANT N/A 08/29/2017   Procedure: Pacemaker Implant;  Surgeon: Deboraha Sprang, MD;  Location: [facility];  Service: Cardiovascular;  Laterality: N/A;  . PORTACATH PLACEMENT  2017     Social History   reports that he has never smoked. He has never used smokeless tobacco. He reports that he does not drink alcohol or use drugs.   Family History   His family history includes Hypertension in his mother.   Allergies No Known Allergies   Home Medications  Prior to Admission medications   Medication Sig Start Date End Date Taking? Authorizing Provider  acetaminophen (TYLENOL) 325 MG tablet Take 2 tablets (650 mg total) by mouth every 4 (four) hours as needed for mild pain. 08/30/17  Yes Isaiah Serge, NP  amLODipine (NORVASC) 10 MG tablet TAKE 1 TABLET BY MOUTH DAILY Patient taking differently: Take 10 mg by mouth daily.  12/11/17  Yes Deboraha Sprang, MD  aspirin EC 81 MG tablet Take 81 mg by mouth daily.   Yes [provider]  ferrous sulfate 325 (65 FE) MG EC tablet Take 1 tablet (325 mg total) by mouth 3 (three) times daily with meals. 11/06/18 12/20/2018 Yes Brunetta Genera, MD  Insulin Glargine (LANTUS SOLOSTAR) 100 UNIT/ML Solostar Pen Inject 40 Units into the skin every morning. Patient taking differently: Inject 40 Units into the skin daily before breakfast.  08/17/18  Yes Newlin, Enobong, MD  insulin regular (HUMULIN R) 100 units/mL injection Inject 0.1 mLs (10 Units total) into the skin 3 (three) times daily before meals. For Blood Sugar greater than 250 Patient taking differently: Inject 10 Units into the skin See admin instructions. Inject 10 units into the skin three times a  day before meals for BGL greater than 250 12/10/17  Yes Jegede, Olugbemiga E, MD  isosorbide mononitrate (IMDUR) 30 MG 24 hr tablet Take 30 mg by mouth daily. 09/17/18  Yes [provider]  JAKAFI 10 MG tablet TAKE 1 TABLET BY MOUTH TWICE DAILY Patient  taking differently: Take 10 mg by mouth 2 (two) times daily.  12/08/18  Yes Brunetta Genera, MD  memantine (NAMENDA) 10 MG tablet Take 1 tablet (10 mg total) by mouth 2 (two) times daily. 08/17/18  Yes Newlin, Enobong, MD  NITROSTAT 0.4 MG SL tablet Place 1 tablet (0.4 mg total) under the tongue every 5 (five) minutes as needed for chest pain. 09/16/17 12/09/2018 Yes Belva Crome, MD  tamsulosin (FLOMAX) 0.4 MG CAPS capsule Take 1 capsule (0.4 mg total) by mouth daily. 08/17/18  Yes Charlott Rakes, MD  ACCU-CHEK FASTCLIX LANCETS MISC Use as directed 3 times daily. E11.9 11/20/17   Tresa Garter, MD  Blood Glucose Monitoring Suppl (ACCU-CHEK AVIVA PLUS) w/Device KIT Use as directed 3 times daily. E11.9 11/20/17   Angelica Chessman E, MD  glucose blood (ACCU-CHEK AVIVA PLUS) test strip Use as directed 3 times daily. E11.9 04/08/18   Argentina Donovan, PA-C  Insulin Pen Needle 31G X 5 MM MISC Use as directed 04/08/18   Argentina Donovan, PA-C     Critical care time: 45 mins    Kennieth Rad, [facility] Pgr: [phone] or if no answer [phone] 12/16/2018, 6:57 AM

## 2018-12-16 NOTE — Consult Note (Addendum)
NEURO HOSPITALIST CONSULT NOTE   Requestig physician: Dr. Jeoffrey Massed  Reason for Consult: Acute change in LOC  History obtained from:  Communication with Primary Team and Chart Review  HPI:                                                                                                                                          Aaron Nelson is an 84 y.o. male with myelodysplastic syndrome and dementia, who was admitted on 12/21 for a fall during which he struck his head, along with associated AMS and dizziness. His baseline here has been easily arousable and conversant. Early this AM, his BP was noted to be elevated at 201/89. When his nurse tried to awaken him, he was not arousable and appeared obtunded. Code Stroke was called and he was taken to CT for STAT imaging. CT head revealed a large right temporoparetal acute hemorrhage with subdural extension, which was not present on the initial scan performed at admission on 12/21. Of note, the initial scan on 12/21 did reveal a left parieto-occipital scalp hematoma without underlying skull fracture.   Past Medical History:  Diagnosis Date  . Diabetes mellitus without complication ([place])   . Hypertension   . Myelodysplasia (myelodysplastic syndrome) ([place])   . S/P placement of cardiac pacemaker 08/30/2017  . Sinus arrest   . Syncope   . Syncope and collapse 08/30/2017    Past Surgical History:  Procedure Laterality Date  . BONE MARROW BIOPSY  2017  . CATARACT EXTRACTION    . EYE SURGERY    . LOOP RECORDER REMOVAL N/A 08/29/2017   Procedure: LOOP RECORDER REMOVAL;  Surgeon: Deboraha Sprang, MD;  Location: [facility];  Service: Cardiovascular;  Laterality: N/A;  . PACEMAKER IMPLANT Left 08/29/2017   St Jude generator  . PACEMAKER IMPLANT N/A 08/29/2017   Procedure: Pacemaker Implant;  Surgeon: Deboraha Sprang, MD;  Location: [facility];  Service: Cardiovascular;  Laterality: N/A;  . PORTACATH PLACEMENT  2017     Family History  Problem Relation Age of Onset  . Hypertension Mother               Social History:  reports that he has never smoked. He has never used smokeless tobacco. He reports that he does not drink alcohol or use drugs.  No Known Allergies  MEDICATIONS:  Scheduled: . ammonia  0.3 mL Inhalation Once  . aspirin EC  81 mg Oral Daily  . feeding supplement (GLUCERNA SHAKE)  237 mL Oral TID BM  . ferrous sulfate  325 mg Oral TID WC  . insulin aspart  0-5 Units Subcutaneous QHS  . insulin aspart  0-9 Units Subcutaneous TID WC  . insulin glargine  10 Units Subcutaneous BH-[id number]  . memantine  10 mg Oral BID  . multivitamin with minerals  1 tablet Oral Daily  . ruxolitinib phosphate  10 mg Oral BID  . tamsulosin  0.4 mg Oral Daily   Continuous:    ROS:                                                                                                                                       Unable to obtain due to obtundation.    Blood pressure (!) 209/90, pulse 99, temperature 98.5 F (36.9 C), temperature source Oral, resp. rate 18, height '5\' 6"'  (1.676 m), weight 69.9 kg, SpO2 100 %.   General Examination:                                                                                                       Physical Exam  HEENT-  Dressing to left parietooccipital scalp.  Lungs: Sonorous respirations Ext: Warm and well-perfused   Neurological Examination Mental Status: Obtunded. Will briefly open eyes partially to sustained sternal rub. Not following commands or attempting to communicate. Withdraws RUE with some semipurposeful movement to noxious.  Cranial Nerves: II: Pupils sluggishly reactive and equal. Blinks to threat on right but not on left.  III,IV, VI: Eyes mildly exotropic. No forced deviation or nystagmus.  V,VII: Mouth open during sonorous breathing.  Unable to definitively assess for facial droop as there is no grimace to brow ridge pressure.  VIII: Does not respond to commands.  IX,X: Palate is symmetric.  XI: Head is turned slightly to the right in the CT scanner. XII: Does not protrude tongue to command.  Motor/Sensory: RUE withdraws with 4-/5 strength.  RLE: Will withdraw and raise antigravity in response to noxious plantar stimulation.  LUE: 0/5 to noxious. Drops immediately when passively raised and released.  LLE; Some withdrawal to noxious, but less brisk than on the right.  Deep Tendon Reflexes: Hypoactive x 4.  Plantars: Equivocal bilaterally  Cerebellar/Gait: Unable to assess   Lab Results: Basic Metabolic Panel: Recent Labs  Lab 12/10/18 1529 12/04/2018 1735 12/13/18 2878 12/14/18 6767  12/15/18 0349  NA 140 138 140 138 136  K 4.9 4.5 3.8 3.9 4.6  CL 106 105 108 108 104  CO2 '23 23 24 24 23  ' GLUCOSE 211* 68* 107* 114* 222*  BUN 24* '18 13 10 16  ' CREATININE 1.84* 1.81* 1.57* 1.43* 1.58*  CALCIUM 9.0 9.3 8.9 8.7* 8.9    CBC: Recent Labs  Lab 12/10/18 1529 12/02/2018 1735 12/13/18 0634 12/15/18 0349  WBC 12.9* 19.6* 13.6* 17.4*  NEUTROABS 2.6 4.3  --   --   HGB 9.6* 9.9* 9.1* 8.5*  HCT 33.0* 35.1* 31.6* 27.8*  MCV 85.5 84.6 83.4 83.2  PLT 46* 50* 44* 43*    Cardiac Enzymes: Recent Labs  Lab 12/11/2018 1735  TROPONINI <0.03    Lipid Panel: No results for input(s): CHOL, TRIG, HDL, CHOLHDL, VLDL, LDLCALC in the last 168 hours.  Imaging: No results found.  Assessment: 84 year old male with myelodysplastic syndrome and dementia, now with acute large right temporoparetal acute hemorrhage with subdural extension 1. Obtunded on exam with plegic LUE and paretic LLE. No blink to threat on the left.  2. Hypertensive.   Recommendations: 1. Discontinue ASA and prophylactic sq anticoagulation. DVT prophylaxis with SCDs.   2. Goal SBP < 140. Initial BP control with labetalol push 10 mg STAT, followed by an  additional push if SBP still above 140, and then either nicardipine or clevidipine drip if BP remains elevated.  3. Consider discussing goals of care with family. If maximal medical management is decided upon, then transfer to the ICU, start hypertonic saline, obtain a repeat CT head in 12 hours and a Neurosurgical consultation, although the patient is unlikely to be a candidate for surgical evacuation of the hematoma due to his advanced age.   40 minutes spent in the emergent neurological evaluation and management of this critically ill patient.    Electronically signed: Dr. Kerney Elbe 12/16/2018, 5:53 AM

## 2018-12-16 NOTE — Progress Notes (Signed)
Aaron Smithis a 84 y.o.malewith medical history significant ofrecurrent syncope and presyncope, sick sinus syndrome status post pacemaker placement, myelodysplasia, hypertension, diabetes, early dementia who lives with his son at home comes in for recurrent falls and syncope. Overnight pt became hypertensive and earlier this am, pt became unresponsive. A code stroke was called and he was intubated for airway protection.  CT head showed intracranial bleed. Pt is on PCCM service.  TRH will sign off.  Hosie Poisson, MD [phone]

## 2018-12-16 NOTE — Procedures (Signed)
Name: Noa Constante MRN: [id number] DOB: 01/11/1930   PROCEDURE NOTE  Procedure:  Endotracheal intubation.  Indication:  Acute respiratory failure  Consent:  Consent was implied due to the emergency nature of the procedure.  Anesthesia:  A total of 10 mg of Etomidate was given intravenously.  Procedure summary:  Appropriate equipment was assembled. The patient was identified as Doreen Beam and safety timeout was performed. The patient was placed supine, with head in sniffing position. After adequate level of anesthesia was achieved, a 4 MAC blade was inserted into the oropharynx and the vocal cords were visualized. A 7.81m endotracheal tube was inserted without difficulty and visualized going through the vocal cords. The stylette was removed and cuff inflated. Colorimetric change was noted on the CO2 meter. Breath sounds were heard over both lung fields equally. ETT was secured at 25 cm lip line.  Post procedure chest xray was ordered.  Complications:  No immediate complications were noted.  Hemodynamic parameters and oxygenation remained stable throughout the procedure.     Pulmonary and [place]  12/16/2018, 6:41 AM

## 2018-12-16 NOTE — Procedures (Signed)
Extubation Procedure Note  Patient Details:   Name: Aaron Nelson DOB: 03/16/1930 MRN: [id number]   Airway Documentation:    Vent end date: 12/16/18 Vent end time: 1207   Evaluation  O2 sats: transiently fell during during procedure Complications: No apparent complications Patient did not tolerate procedure well. Bilateral Breath Sounds: Clear, Diminished   No.  Pt was terminally extubated per Dr.'s orders at family request  Prabhjot Maddux, [facility] 12/16/2018, 12:08 PM

## 2018-12-16 NOTE — Progress Notes (Signed)
eLink Physician-Brief Progress Note Patient Name: Aaron Nelson DOB: Feb 26, 1930 MRN: [id number]   Date of Service  12/16/2018  HPI/Events of Note    eICU Interventions  51 Male had acute change in mentation. Neurology ordered a stat CT- showing a new rt ICB with subdural extension. Camera assessment done, discussed with bed side RN. No gag. Low GCS. Dementia. Full code. Oco2 27. Hypertensive going on cardene gtt. Notified Broke, NP to come bed side for Intubation. Stat CBC ordered ( last Plt 45 K). If Platelet low, consider platelet transfusion. Has MDS.       Intervention Category Major Interventions: Change in mental status - evaluation and management;Hypertension - evaluation and management  Elmer Sow 12/16/2018, 6:23 AM

## 2018-12-16 NOTE — Progress Notes (Signed)
Patient is AMS at this time, SATs are stable.  ABG ordered and collected.

## 2018-12-17 ENCOUNTER — Inpatient Hospital Stay: Payer: Medicare HMO

## 2018-12-17 DIAGNOSIS — I611 Nontraumatic intracerebral hemorrhage in hemisphere, cortical: Secondary | ICD-10-CM

## 2018-12-17 NOTE — Progress Notes (Signed)
Called son Bryker Fletchall [phone]) and left message for return call.  Want to discuss option of residential hospice with family.    Noe Gens, NP-C [facility] Pgr: [phone] or if no answer [phone] 12/17/2018, 11:14 AM

## 2018-12-17 NOTE — Progress Notes (Addendum)
NAME:  Aaron Nelson, MRN:  [id number], DOB:  [birth year], LOS: 5 ADMISSION DATE:  12/03/2018, CONSULTATION DATE:  12/16/2018 REFERRING MD:  Dr. Cheral Marker, CHIEF COMPLAINT:  AMS  Brief History   18 yoM admitted on 12/21 with syncope and fall.  Overnight was confused but MAE.  Around 0330 noted to be more hypertensive with new focal right sided deficits.  Code stroke called 0526.  Found on CT head to have new R IPH and R SDH.  Transferred to ICU.  Patient not protecting his airway.  PCCM called for further management.  The patient was intubated 12/25 and subsequently extubated and made comfort care after family discussion.   Past Medical History  Recurrent syncope, sick sinus syndrome s/p pacemaker, myelodysplasia, HTN, DM, early dementia.    Significant Hospital Events   12/21 Admitted 12/25  Decompensated/ ICU/ intubated 12/25 Extubated, made comfort care  Consults:  Neurology  Hospice/Palliative Care Procedures:  12/25 ETT  Significant Diagnostic Tests:  12/21 [facility] / cervical >> 1. Head CT: No acute or traumatic intracranial finding. Atrophy and chronic small-vessel ischemic changes. Left parieto-occipital scalp hematoma without underlying skull fracture. 2. Cervical spine CT: No acute traumatic finding. 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 Chronic degenerative changes as outlined above.  12/25 CTH >> 1. Acute RIGHT temporoparietal hemorrhage, dominant component is 62 cc. 8 mm RIGHT to LEFT midline shift with early suspected LEFT ventricle entrapment. RIGHT subdural extension measuring to 10 mm. 2. Small volume dependent LEFT occipital horn blood products are likely redistributed, less likely intraventricular extension of hemorrhage.  Micro Data:   Antimicrobials:   Interim history/subjective:  Has been compassionately/terminally extubated and is now comfort care  Objective   Blood pressure (!) 172/77, pulse 90, temperature 99.6 F (37.6 C), temperature  source Axillary, resp. rate (!) 26, height 5\' 6"  (1.676 m), weight 69.9 kg, SpO2 100 %.   Intake/Output Summary (Last 24 hours) at 12/17/2018 0940 Last data filed at 12/17/2018 0000 Gross per 24 hour  Intake 51.93 ml  Output 900 ml  Net -848.07 ml   Filed Weights   12/14/18 0425 12/15/18 0035 12/16/18 0406  Weight: 71.6 kg 73.1 kg 69.9 kg   Examination: General:  Frail appearing older adult male.  HEENT: normocephalic, atraumatic. Poor dentition with multiple missing teeth.  Neuro: Obtunded. Does not open eyes, does not follow commands.  CV: 1+ radial pulses bilaterally. No r/g/m  PULM: no adventitious lung sounds. Accessory muscle recruitment appreciated, nasal flaring.  GI:  Soft, non-distended. Normoactive bowel sounds x4 quadrants Extremities: no pedal edema.  Skin: warm, dry, clean, intact.    Resolved Hospital Problem list    Assessment & Plan:  Following goals of care discussion with family, patient has been transitioned to comfort care measures. He has been compassionately extubated and treatment measures will now focus on ensuring comfort and appropriate palliation.   Plan - Transfer for inpatient palliative care -Will discuss option for residential hospice; anticipate life span < 2 weeks    Large right temporoparetal acute hemorrhage with subdural extension P:  Lorazepam PRN for seizure  Acute respiratory insufficiency related to above Intubated 12/25 Extubated 12/25 when patient made comfort care Plan Morphine PRN for dyspnea  Hypertensive crisis cleviprex dc 12/25 when patient made comfort care P:  No BP monitoring  Lorazepam PRN for anxiety/aggitation   Thrombocytopenia w/ MDS P:  No further labs or workup.   DM with hypoglycemic episodes  P:  No CBG D5% 16ml/hr  Syncopal Sick sinus syndrome w/ pacemaker - interrogation by EP normal P:   No cardiac monitoring  Best practice:  Diet: NPO Pain/Anxiety/Delirium protocol (if indicated):  Morphine, Ativan PRN VAP protocol (if indicated): n/a DVT prophylaxis: comfort care GI prophylaxis: comfort care Glucose control:  Mobility: bedrest Code Status: DNR/I; comfort care Family Communication: Family not present Disposition: Transfer for inpatient palliative care  Labs   CBC: Recent Labs  Lab 12/10/18 1529 11/22/2018 1735 12/13/18 0634 12/15/18 0349 12/16/18 0700  WBC 12.9* 19.6* 13.6* 17.4* 19.7*  NEUTROABS 2.6 4.3  --   --  8.5*  HGB 9.6* 9.9* 9.1* 8.5* 7.1*  HCT 33.0* 35.1* 31.6* 27.8* 23.9*  MCV 85.5 84.6 83.4 83.2 81.6  PLT 46* 50* 44* 43* 48*    Basic Metabolic Panel: Recent Labs  Lab 12/09/2018 1735 12/13/18 0634 12/14/18 0521 12/15/18 0349 12/16/18 0700  NA 138 140 138 136 133*  K 4.5 3.8 3.9 4.6 5.1  CL 105 108 108 104 101  CO2 23 24 24 23  20*  GLUCOSE 68* 107* 114* 222* 354*  BUN 18 13 10 16 16   CREATININE 1.81* 1.57* 1.43* 1.58* 1.47*  CALCIUM 9.3 8.9 8.7* 8.9 8.6*  MG  --   --   --   --  1.8  PHOS  --   --   --   --  4.0   GFR: Estimated Creatinine Clearance: 33.2 mL/min (A) (by C-G formula based on SCr of 1.47 mg/dL (H)). Recent Labs  Lab 11/30/2018 1735 12/13/18 0634 12/15/18 0349 12/16/18 0700  WBC 19.6* 13.6* 17.4* 19.7*    Liver Function Tests: Recent Labs  Lab 12/10/18 1529 12/04/2018 1735 12/13/18 0634 12/16/18 0700  AST 56* 64* 57*  --   ALT 27 28 25   --   ALKPHOS 94 101 87  --   BILITOT 1.6* 1.3* 1.3*  --   PROT 7.0 7.8 7.1  --   ALBUMIN 3.9 4.0 3.5 3.3*   No results for input(s): LIPASE, AMYLASE in the last 168 hours. No results for input(s): AMMONIA in the last 168 hours.  ABG    Component Value Date/Time   PHART 7.457 (H) 12/16/2018 0515   PCO2ART 27.7 (L) 12/16/2018 0515   PO2ART 76.1 (L) 12/16/2018 0515   HCO3 19.3 (L) 12/16/2018 0515   ACIDBASEDEF 3.9 (H) 12/16/2018 0515   O2SAT 96.5 12/16/2018 0515     Coagulation Profile: Recent Labs  Lab 12/13/2018 1735  INR 1.16    Cardiac Enzymes: Recent Labs   Lab 12/09/2018 1735  TROPONINI <0.03    HbA1C: Hemoglobin A1C  Date/Time Value Ref Range Status  08/17/2018 10:23 AM 7.5 (A) 4.0 - 5.6 % Final  04/08/2018 09:33 AM 11.4  Final   Hgb A1c MFr Bld  Date/Time Value Ref Range Status  12/13/2018 06:34 AM 7.0 (H) 4.8 - 5.6 % Final    Comment:    (NOTE) Pre diabetes:          5.7%-6.4% Diabetes:              >6.4% Glycemic control for   <7.0% adults with diabetes     CBG: Recent Labs  Lab 12/15/18 1127 12/15/18 1648 12/15/18 2121 12/16/18 0452 12/16/18 [place] MSN, AGACNP-BC [facility] 12/17/2018, 9:40 AM

## 2018-12-17 NOTE — Social Work (Signed)
CSW acknowledging consult for comfort care. Paged Aaron Nelson regarding potential facility choice from family.   Aaron Nelson, [place] Work [phone]

## 2018-12-17 NOTE — Progress Notes (Signed)
Nutrition Brief Note  Chart reviewed. Pt now transitioning to comfort care.  No further nutrition interventions warranted at this time.  Please re-consult as needed.   Skylier Kretschmer A. Damaso Laday, RD, LDN, CDE Pager: [phone] After hours Pager: [phone]  

## 2018-12-17 NOTE — Progress Notes (Signed)
Patient transferred to [facility] room 30. Unresponsive. Respirations even and unlabored. No apparent distress. Plan for comfort care. No family present.

## 2018-12-18 ENCOUNTER — Encounter (HOSPITAL_COMMUNITY): Payer: Self-pay

## 2018-12-18 DIAGNOSIS — Z23 Encounter for immunization: Secondary | ICD-10-CM | POA: Diagnosis not present

## 2018-12-19 NOTE — Progress Notes (Signed)
   11/24/2018 0033  Attending [place]  Attending Physician Notified Y  Attending Physician (First and Last Name) Heath Lark, DO  Post Mortem Checklist  Date of Death December 23, 2018  Time of Death [id number]  Pronounced By Prescott Parma RN, [place], RN  Next of kin notified Yes  Name of next of kin notified of death Algis Liming Person's Relationship to Patient Son  Contact Person's Phone Number [phone]  Contact Person's address [place], [place] [address]  Was the patient a No Code Blue or a Limited Code Blue? Yes  Did the patient die unattended? No  Patient restrained? Not applicable  Height 5\' 6"  (1.676 m)  Weight 69.8 kg  [place] [facility]  Notification Date 12/16/18  Notification Time [place] [facility] Number [phone]  Is patient a potential donor? N  Autopsy  Autopsy requested by N/A  Patient Belongings/Medications Returned  Patient belongings from bedside/safe/pharmacy returned  Yes  Valuables returned to? [facility]  Specify valuables returned clothings  Dead on Arrival (Emergency Department)  Patient dead on arrival? No  Medical Examiner  Is this a medical examiner's case? Y  Date Medical Examiner notified 12/21/2018  Time Medical Examiner notified [place]  Name of Secretary  Name of person who notified medical examiner Nellie, RN  Does the Medical Examiner consider this an ME case? [place] home name/address/phone # [facility] not listed  Name/Address/Phone # of [place], [place], [place] [address]/ [phone]  Planned location of [place]

## 2018-12-23 NOTE — Care Management Important Message (Signed)
Important Message  Patient Details  Name: Aaron Nelson MRN: [id number] Date of Birth: 08/14/1930   Medicare Important Message Given:  Yes    Annais Crafts P Lorne Winkels 12-22-18, 4:54 PM

## 2018-12-23 NOTE — Death Summary Note (Signed)
[facility] Death Summary   Patient: Aaron Nelson EYC:[id number]   PCP: Charlott Rakes, MD DOB: [birth year]   Date of admission: Dec 17, 2018   Date and time of death: 2018/12/23 @ 23:58   Hospital Diagnoses:  Cause of death Intracranial hemorrhage after a fall with midline shift. Principal Problem:   Postural dizziness with presyncope Active Problems:   Essential hypertension   Uncontrolled type 2 diabetes mellitus without complication, with long-term current use of insulin (HCC)   Myelofibrosis (HCC)   Sinus node dysfunction (HCC)   Syncope and collapse   S/P placement of cardiac pacemaker   Fall   Dementia due to Alzheimer's disease ([place])   Pressure injury of skin   Goals of care, counseling/discussion   Palliative care encounter   Endotracheal tube present    History of present illness: As per the H and P dictated on admission, "Aaron Nelson is a 85 y.o. male with medical history significant of recurrent syncope and presyncope, sick sinus syndrome status post pacemaker placement, myelodysplasia, hypertension, diabetes, early dementia who lives with his son at home.  Patient was recently admitted to the hospital and discharged due to recurrent fall.  He was apparently at home with his son when patient forgot to use his walker and walked towards the bathroom.  He went in the fell backward hitting the back of his head on the floor.  He has an open laceration which was addressed in the ER.  Patient is on reported that he has had this recurrent dizziness episodes.  Patient has no understanding as to what brings them on.  EMS showed no significant drop in blood sugar.  Blood pressure was very much elevated systolic over 149 when they arrived the ER.  Patient was mobilized in the ER but was unsteady and unable to stand on his feet.  There is worried that this is due to significant arrhythmia with his extensive cardiac history.  He is therefore being admitted for further evaluation.  ED  Course: Temperature is 98 for blood pressure 209/83 pulse is 80 respiratory rate of 16 oxygen sat 100% on room air.  White count is 19.6 hemoglobin 9.9 and platelets of 50.  Sodium 138 potassium 4 chloride 105 CO2 23 with creatinine 1.81 and glucose of 68.  Urinalysis essentially negative.  Chest x-ray also negative.  X-ray of the pelvis was also negative.  CT cervical spine as well as head and chest were all within normal except for subacute fracture of the left ninth rib."  Hospital Course:  Presented to the hospital on 2018-12-17 with a fall.  Initial CT scan did not show any bleeding.  But progressively her mentation worsened.  Positive on 12/15/2018 patient become hypotensive and become unresponsive.  Patient was intubated for airway protection after calling a code.  CT of the head showed intracranial hemorrhage on 12/17/2018 the patient was transition to complete comfort in the ICU and was transferred out on the floor. The patient was pronounced deceased at 43, on Dec 23, 2018.  Procedures and Results:  Intubation  Consultations:  PCCM  Neurology  Oncology  The results of significant diagnostics from this hospitalization (including imaging, microbiology, ancillary and laboratory) are listed below for reference.    Significant Diagnostic Studies: Ct Abdomen Pelvis Wo Contrast  Result Date: 12/17/2018 CLINICAL DATA:  Unwitnessed fall. Renal failure. EXAM: CT CHEST, ABDOMEN AND PELVIS WITHOUT CONTRAST TECHNIQUE: Multidetector CT imaging of the chest, abdomen and pelvis was performed following the standard protocol without IV contrast. COMPARISON:  Chest radiograph 12/11/2018. Pelvic radiograph 12/01/2018. Prior chest CT 12/02/2018. FINDINGS: CT CHEST FINDINGS Cardiovascular: Dual lead pacer. Aortic atherosclerosis. Tortuous thoracic aorta. Normal heart size, without pericardial effusion. Lad coronary artery calcification. Mediastinum/Nodes: No mediastinal or definite hilar adenopathy,  given limitations of unenhanced CT. Lungs/Pleura: No pleural fluid or pneumothorax. Right lower lobe calcified granuloma on image 85/4. Minimal left apical pleuroparenchymal scarring. Musculoskeletal: Moderate thoracic spondylosis. Ninth posterolateral left rib fracture is likely subacute, minimally displaced on image 52/3. CT ABDOMEN PELVIS FINDINGS Hepatobiliary: Mild degradation secondary to patient arm position, not raised above the head. Normal noncontrast appearance of the liver, gallbladder, biliary tract. Pancreas: Normal, without mass or ductal dilatation. Spleen: Atypical splenic morphology, without focal abnormality. Similar. Adrenals/Urinary Tract: Normal adrenal glands. Left kidney is ptotic. No renal calculi or hydronephrosis. Mild bladder wall irregularity. Stomach/Bowel: Normal stomach, without wall thickening. Large colonic stool burden, including a 5.9 cm stool ball in the rectum. Normal terminal ileum and appendix. Normal small bowel. Vascular/Lymphatic: Aortic and branch vessel atherosclerosis. No abdominopelvic adenopathy. Reproductive: Normal prostate. Other: No significant free fluid.  No free intraperitoneal air. Musculoskeletal: Suspect renal osteodystrophy, mild. Lumbosacral spondylosis IMPRESSION: 1. Ninth posterolateral left rib fracture is likely subacute. 2. Otherwise, no acute or posttraumatic deformity identified. 3. Coronary artery atherosclerosis. 4. Mild degradation secondary to patient arm position. 5.  Aortic Atherosclerosis (ICD10-I70.0). 6. Possible constipation and fecal impaction. 7. Mild bladder wall irregularity could represent a component of outlet obstruction. Aortic Atherosclerosis (ICD10-I70.0). Electronically Signed   By: Abigail Miyamoto M.D.   On: 12/21/2018 19:26   Dg Chest 1 View  Result Date: 11/24/2018 CLINICAL DATA:  Trauma, unwitnessed fall EXAM: CHEST  1 VIEW COMPARISON:  12/02/2018, CT chest 12/02/2018 FINDINGS: Left-sided pacing device with similar  appearance of intracardiac leads. No acute airspace disease or effusion. Normal heart size. Aortic atherosclerosis. No pneumothorax. IMPRESSION: No active disease. Electronically Signed   By: Donavan Foil M.D.   On: 11/25/2018 17:53   Dg Chest 2 View  Result Date: 12/02/2018 CLINICAL DATA:  Shortness of breath and weakness EXAM: CHEST - 2 VIEW COMPARISON:  04/21/2018 FINDINGS: Cardiac shadow is stable. Pacing device is again seen and stable. The lungs are well aerated bilaterally. No focal infiltrate or sizable effusion is seen. Mild degenerative changes of the thoracic spine are noted. IMPRESSION: No acute abnormality noted. Electronically Signed   By: Inez Catalina M.D.   On: 12/02/2018 14:12   Ct Head Wo Contrast  Result Date:  CLINICAL DATA:  Unwitnessed fall. Laceration to the back of the head. EXAM: CT HEAD WITHOUT CONTRAST CT CERVICAL SPINE WITHOUT CONTRAST TECHNIQUE: Multidetector CT imaging of the head and cervical spine was performed following the standard protocol without intravenous contrast. Multiplanar CT image reconstructions of the cervical spine were also generated. COMPARISON:  04/21/2018 FINDINGS: CT HEAD FINDINGS Brain: Generalized atrophy. Chronic small-vessel ischemic changes of the white matter. Old right parietal vertex cortical and subcortical infarction. No sign of acute infarction, mass lesion, hemorrhage, hydrocephalus or extra-axial collection. Vascular: There is atherosclerotic calcification of the major vessels at the base of the brain. Skull: No skull fracture. Sinuses/Orbits: Chronic mucoperiosteal thickening in the sphenoid sinus. Other: Left parieto-occipital scalp hematoma. CT CERVICAL SPINE FINDINGS Alignment: No traumatic malalignment. Skull base and vertebrae: No evidence of regional acute fracture. 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 Soft tissues and spinal canal: No soft tissue injury. Disc levels: Chronic fusion at C2-3 and C4-5.  Chronic degenerative facet arthropathy and spondylosis at  C3-4, C5-6, C6-7 and C7-T1. Upper chest: Negative Other: None IMPRESSION: 1. Head CT: No acute or traumatic intracranial finding. Atrophy and chronic small-vessel ischemic changes. Left parieto-occipital scalp hematoma without underlying skull fracture. 2. Cervical spine CT: No acute traumatic finding. 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 Chronic degenerative changes as outlined above. Electronically Signed   By: Nelson Chimes M.D.   On: 11/25/2018 19:33   Ct Chest Wo Contrast  Result Date: 12/08/2018 CLINICAL DATA:  Unwitnessed fall. Renal failure. EXAM: CT CHEST, ABDOMEN AND PELVIS WITHOUT CONTRAST TECHNIQUE: Multidetector CT imaging of the chest, abdomen and pelvis was performed following the standard protocol without IV contrast. COMPARISON:  Chest radiograph 12/11/2018. Pelvic radiograph 11/28/2018. Prior chest CT 12/02/2018. FINDINGS: CT CHEST FINDINGS Cardiovascular: Dual lead pacer. Aortic atherosclerosis. Tortuous thoracic aorta. Normal heart size, without pericardial effusion. Lad coronary artery calcification. Mediastinum/Nodes: No mediastinal or definite hilar adenopathy, given limitations of unenhanced CT. Lungs/Pleura: No pleural fluid or pneumothorax. Right lower lobe calcified granuloma on image 85/4. Minimal left apical pleuroparenchymal scarring. Musculoskeletal: Moderate thoracic spondylosis. Ninth posterolateral left rib fracture is likely subacute, minimally displaced on image 52/3. CT ABDOMEN PELVIS FINDINGS Hepatobiliary: Mild degradation secondary to patient arm position, not raised above the head. Normal noncontrast appearance of the liver, gallbladder, biliary tract. Pancreas: Normal, without mass or ductal dilatation. Spleen: Atypical splenic morphology, without focal abnormality. Similar. Adrenals/Urinary Tract: Normal adrenal glands. Left kidney is ptotic. No renal calculi or hydronephrosis. Mild bladder  wall irregularity. Stomach/Bowel: Normal stomach, without wall thickening. Large colonic stool burden, including a 5.9 cm stool ball in the rectum. Normal terminal ileum and appendix. Normal small bowel. Vascular/Lymphatic: Aortic and branch vessel atherosclerosis. No abdominopelvic adenopathy. Reproductive: Normal prostate. Other: No significant free fluid.  No free intraperitoneal air. Musculoskeletal: Suspect renal osteodystrophy, mild. Lumbosacral spondylosis IMPRESSION: 1. Ninth posterolateral left rib fracture is likely subacute. 2. Otherwise, no acute or posttraumatic deformity identified. 3. Coronary artery atherosclerosis. 4. Mild degradation secondary to patient arm position. 5.  Aortic Atherosclerosis (ICD10-I70.0). 6. Possible constipation and fecal impaction. 7. Mild bladder wall irregularity could represent a component of outlet obstruction. Aortic Atherosclerosis (ICD10-I70.0). Electronically Signed   By: Abigail Miyamoto M.D.   On: 12/17/2018 19:26   Ct Angio Chest Pe W/cm &/or Wo Cm  Result Date: 12/02/2018 CLINICAL DATA:  Shortness of breath. EXAM: CT ANGIOGRAPHY CHEST WITH CONTRAST TECHNIQUE: Multidetector CT imaging of the chest was performed using the standard protocol during bolus administration of intravenous contrast. Multiplanar CT image reconstructions and MIPs were obtained to evaluate the vascular anatomy. CONTRAST:  20mL ISOVUE-370 IOPAMIDOL (ISOVUE-370) INJECTION 76% COMPARISON:  Chest x-ray dated 12/02/2018 and CT angiogram of the chest dated 11/26/2017 FINDINGS: Cardiovascular: Satisfactory opacification of the pulmonary arteries to the segmental level. No evidence of pulmonary embolism. Normal heart size. No pericardial effusion. Mediastinum/Nodes: No enlarged mediastinal, hilar, or axillary lymph nodes. Thyroid gland, trachea, and esophagus demonstrate no significant findings. Lungs/Pleura: Lungs are clear except for a small bleb in the posterior aspect of the left upper lobe. No  pleural effusion or pneumothorax. Upper Abdomen: Chronic splenomegaly. Aortic atherosclerosis. Musculoskeletal: No chest wall abnormality. No acute or significant osseous findings. Review of the MIP images confirms the above findings. IMPRESSION: 1. No pulmonary emboli or other acute abnormalities. 2. Chronic splenomegaly. Electronically Signed   By: Lorriane Shire M.D.   On: 12/02/2018 16:30   Ct Cervical Spine Wo Contrast  Result Date: 11/22/2018 CLINICAL DATA:  Unwitnessed fall. Laceration to the  back of the head. EXAM: CT HEAD WITHOUT CONTRAST CT CERVICAL SPINE WITHOUT CONTRAST TECHNIQUE: Multidetector CT imaging of the head and cervical spine was performed following the standard protocol without intravenous contrast. Multiplanar CT image reconstructions of the cervical spine were also generated. COMPARISON:  04/21/2018 FINDINGS: CT HEAD FINDINGS Brain: Generalized atrophy. Chronic small-vessel ischemic changes of the white matter. Old right parietal vertex cortical and subcortical infarction. No sign of acute infarction, mass lesion, hemorrhage, hydrocephalus or extra-axial collection. Vascular: There is atherosclerotic calcification of the major vessels at the base of the brain. Skull: No skull fracture. Sinuses/Orbits: Chronic mucoperiosteal thickening in the sphenoid sinus. Other: Left parieto-occipital scalp hematoma. CT CERVICAL SPINE FINDINGS Alignment: No traumatic malalignment. Skull base and vertebrae: No evidence of regional acute fracture. 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 Soft tissues and spinal canal: No soft tissue injury. Disc levels: Chronic fusion at C2-3 and C4-5. Chronic degenerative facet arthropathy and spondylosis at C3-4, C5-6, C6-7 and C7-T1. Upper chest: Negative Other: None IMPRESSION: 1. Head CT: No acute or traumatic intracranial finding. Atrophy and chronic small-vessel ischemic changes. Left parieto-occipital scalp hematoma without underlying skull  fracture. 2. Cervical spine CT: No acute traumatic finding. 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 Chronic degenerative changes as outlined above. Electronically Signed   By: Nelson Chimes M.D.   On: 12/03/2018 19:33   Dg Pelvis Portable  Result Date: 12/14/2018 CLINICAL DATA:  Unwitnessed fall EXAM: PORTABLE PELVIS 1-2 VIEWS COMPARISON:  None. FINDINGS: SI joints are non widened. Pubic symphysis and rami are intact. No fracture or malalignment. IMPRESSION: No acute osseous abnormality Electronically Signed   By: Donavan Foil M.D.   On: 12/11/2018 17:53   Portable Chest X-ray  Result Date: 12/16/2018 CLINICAL DATA:  Endotracheal tube placement. EXAM: PORTABLE CHEST 1 VIEW COMPARISON:  CT of 11/30/2018 FINDINGS: Endotracheal tube terminates 6.0 cm above carina. Nasogastric tube extends beyond the inferior aspect of the film. Dual lead pacer. Numerous leads and wires project over the chest. Midline trachea. Normal heart size. Atherosclerosis in the transverse aorta. No pleural effusion or pneumothorax. Mild right base subsegmental atelectasis. IMPRESSION: 1. No acute cardiopulmonary disease. 2. Endotracheal tube terminates 6.0 cm above carina. Electronically Signed   By: Abigail Miyamoto M.D.   On: 12/16/2018 07:24   Ct Head Code Stroke Wo Contrast  Result Date: 12/16/2018 CLINICAL DATA:  Code stroke. Altered level of consciousness. LEFT-sided weakness. History of hypertension, diabetes, myelo dysplasia. EXAM: CT HEAD WITHOUT CONTRAST TECHNIQUE: Contiguous axial images were obtained from the base of the skull through the vertex without intravenous contrast. COMPARISON:  CT HEAD December 12, 2018. FINDINGS: BRAIN: RIGHT temporoparietal hemorrhages, dominant component is 3.2 x 6.1 x 6.1 cm (volume = 62 cc). Surrounding low low-density vasogenic edema. RIGHT lateral ventricle effacement, dependent blood products LEFT occipital horn. 8 mm RIGHT to LEFT midline shift with early suspected LEFT  ventricle entrapment. RIGHT extra-axial extension with dense holo hemispheric subdural hematoma measuring to 10 mm, trace falcotentorial component. Narrowed basal cisterns. Small areas RIGHT frontal parietal encephalomalacia better demonstrated on prior CT. VASCULAR: Moderate calcific atherosclerosis of the carotid siphons. SKULL: No skull fracture. Large LEFT scalp hematoma with skin staples. RIGHT occipital scalp lipoma. SINUSES/ORBITS: Chronic LEFT sphenoid sinusitis, mild paranasal sinus mucosal thickening. Mastoid air cells are well aerated.The included ocular globes and orbital contents are non-suspicious. Status post bilateral ocular lens implants. OTHER: None. ASPECTS [facility] Stroke Program Early CT Score) Not applicable in the setting of  hemorrhage. IMPRESSION: 1. Acute RIGHT temporoparietal hemorrhage, dominant component is 62 cc. 8 mm RIGHT to LEFT midline shift with early suspected LEFT ventricle entrapment. RIGHT subdural extension measuring to 10 mm. 2. Small volume dependent LEFT occipital horn blood products are likely redistributed, less likely intraventricular extension of hemorrhage. Critical Value/emergent results text paged to [place], Neurology via [employer] secure system on 12/16/2018 at 5:48 am, including interpreting physician's phone number. Electronically Signed   By: Elon Alas M.D.   On: 12/16/2018 05:54    Microbiology: Recent Results (from the past 240 hour(s))  MRSA PCR Screening     Status: None   Collection Time: 12/16/18  7:09 AM  Result Value Ref Range Status   MRSA by PCR NEGATIVE NEGATIVE Final    Comment:        The GeneXpert MRSA Assay (FDA approved for NASAL specimens only), is one component of a comprehensive MRSA colonization surveillance program. It is not intended to diagnose MRSA infection nor to guide or monitor treatment for MRSA infections. Performed at [facility], [place] [address] [place], [place] [address]       Labs: CBC: Recent Labs  Lab 11/30/2018 1735 12/13/18 0634 12/15/18 0349 12/16/18 0700  WBC 19.6* 13.6* 17.4* 19.7*  NEUTROABS 4.3  --   --  8.5*  HGB 9.9* 9.1* 8.5* 7.1*  HCT 35.1* 31.6* 27.8* 23.9*  MCV 84.6 83.4 83.2 81.6  PLT 50* 44* 43* 48*   Basic Metabolic Panel: Recent Labs  Lab 12/07/2018 1735 12/13/18 0634 12/14/18 0521 12/15/18 0349 12/16/18 0700  NA 138 140 138 136 133*  K 4.5 3.8 3.9 4.6 5.1  CL 105 108 108 104 101  CO2 23 24 24 23  20*  GLUCOSE 68* 107* 114* 222* 354*  BUN 18 13 10 16 16   CREATININE 1.81* 1.57* 1.43* 1.58* 1.47*  CALCIUM 9.3 8.9 8.7* 8.9 8.6*  MG  --   --   --   --  1.8  PHOS  --   --   --   --  4.0   Liver Function Tests: Recent Labs  Lab 12/10/2018 1735 12/13/18 0634 12/16/18 0700  AST 64* 57*  --   ALT 28 25  --   ALKPHOS 101 87  --   BILITOT 1.3* 1.3*  --   PROT 7.8 7.1  --   ALBUMIN 4.0 3.5 3.3*   No results for input(s): LIPASE, AMYLASE in the last 168 hours. No results for input(s): AMMONIA in the last 168 hours. Cardiac Enzymes: Recent Labs  Lab 12/11/2018 1735  TROPONINI <0.03   BNP (last 3 results) Recent Labs    12/02/18 1333  BNP 70.1   CBG: Recent Labs  Lab 12/15/18 1127 12/15/18 1648 12/15/18 2121 12/16/18 0452 12/16/18 0723  GLUCAP 166* 130* 155* 300* 331*   Time spent: 30 minutes  Signed:  Berle Mull  [facility] 12/13/2018, 10:05 AM

## 2018-12-23 NOTE — Progress Notes (Signed)
Spoke with Laqueta Linden, daughter, from [facility] and updated her on pt status.

## 2018-12-23 NOTE — Progress Notes (Signed)
PROGRESS NOTE    Aaron Nelson  ZTI:[id number] DOB: [birth year] DOA: 12/20/2018 PCP: Charlott Rakes, MD   Brief Narrative:  Per HPI: Aaron Smithis a 85 y.o.malewith medical history significant ofrecurrent syncope and presyncope, sick sinus syndrome status post pacemaker placement, myelodysplasia, hypertension, diabetes, early dementia who lives with his son at home comes in for recurrent falls and syncope.  CT of the head without contrast on his arrival cervical spine shows Atrophy and chronic small-vessel ischemic changes. Left parieto-occipital scalp hematoma without underlying skull fracture. 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  Overnight on 12/24 patient became hypertensive and then became unresponsive.  A code stroke was called and patient was intubated for airway protection and CT of the head demonstrated an intracranial bleed patient was transferred to [facility] service.  Family members at bedside confirmed DNR status and there was no further escalation of care and he was extubated and placed on comfort care.  He continues to remain on comfort measures with social workers attempting hospice facility placement.   Assessment & Plan:   Principal Problem:   Postural dizziness with presyncope Active Problems:   Essential hypertension   Uncontrolled type 2 diabetes mellitus without complication, with long-term current use of insulin (HCC)   Myelofibrosis (HCC)   Sinus node dysfunction (HCC)   Syncope and collapse   S/P placement of cardiac pacemaker   Fall   Dementia due to Alzheimer's disease ([place])   Pressure injury of skin   Goals of care, counseling/discussion   Palliative care encounter   Endotracheal tube present   1. Intracranial hemorrhage with midline shift- currently on comfort measures. 2. Recurrent falls secondary to syncopal episodes with hypoglycemia. 3. Type 2 diabetes. 4. History of sick sinus syndrome status post pacemaker placement.  EP  consulted and pacemaker was interrogated with normal device function. 5. History of myelofibrosis. 6. History of hypertension. 7. History of dementia. 8. AKI. 9. Leukocytosis. 10. Pressure injury to the left posterior shoulder present on admission secondary to fall.   DVT prophylaxis: SCDs Code Status: DNR and on comfort measures Family Communication: None at bedside, CSW working on contacting them for placement Disposition Plan: Hospice facility placement being attempted.  Continue comfort measures.   Consultants:   PCCM  Neurology  Oncology  Procedures:   None  Antimicrobials:   None   Subjective: Patient seen and evaluated today and appears to be resting comfortably.  No acute overnight events noted.  Objective: Vitals:   12/17/18 0700 12/17/18 0800 12/17/18 1331 01/17/19 0646  BP: (!) 172/77  (!) 174/78 (!) 159/66  Pulse: 90 94 88 (!) 108  Resp: (!) 26 (!) 32 (!) 24   Temp:   98.7 F (37.1 C) 99.5 F (37.5 C)  TempSrc:   Oral Oral  SpO2: 100% 100% 100% 99%  Weight:      Height:        Intake/Output Summary (Last 24 hours) at 17-Jan-2019 1206 Last data filed at 01-17-2019 8250 Gross per 24 hour  Intake -  Output 600 ml  Net -600 ml   Filed Weights   12/14/18 0425 12/15/18 0035 12/16/18 0406  Weight: 71.6 kg 73.1 kg 69.9 kg    Examination:  General exam: Appears calm and comfortable  Respiratory system: Clear to auscultation. Respiratory effort normal.  Breathing is unlabored.  On nasal cannula. Cardiovascular system: S1 & S2 heard, RRR. No JVD, murmurs, rubs, gallops or clicks. No pedal edema. Gastrointestinal system: Abdomen is nondistended, soft and nontender.  No organomegaly or masses felt. Normal bowel sounds heard. Central nervous system: Patient is unresponsive. Extremities: Symmetric 5 x 5 power. Skin: No rashes, lesions or ulcers Psychiatry: Cannot be assessed.    Data Reviewed: I have personally reviewed following labs and imaging  studies  CBC: Recent Labs  Lab 12/11/2018 1735 12/13/18 0634 12/15/18 0349 12/16/18 0700  WBC 19.6* 13.6* 17.4* 19.7*  NEUTROABS 4.3  --   --  8.5*  HGB 9.9* 9.1* 8.5* 7.1*  HCT 35.1* 31.6* 27.8* 23.9*  MCV 84.6 83.4 83.2 81.6  PLT 50* 44* 43* 48*   Basic Metabolic Panel: Recent Labs  Lab 11/28/2018 1735 12/13/18 0634 12/14/18 0521 12/15/18 0349 12/16/18 0700  NA 138 140 138 136 133*  K 4.5 3.8 3.9 4.6 5.1  CL 105 108 108 104 101  CO2 23 24 24 23  20*  GLUCOSE 68* 107* 114* 222* 354*  BUN 18 13 10 16 16   CREATININE 1.81* 1.57* 1.43* 1.58* 1.47*  CALCIUM 9.3 8.9 8.7* 8.9 8.6*  MG  --   --   --   --  1.8  PHOS  --   --   --   --  4.0   GFR: Estimated Creatinine Clearance: 33.2 mL/min (A) (by C-G formula based on SCr of 1.47 mg/dL (H)). Liver Function Tests: Recent Labs  Lab 12/14/2018 1735 12/13/18 0634 12/16/18 0700  AST 64* 57*  --   ALT 28 25  --   ALKPHOS 101 87  --   BILITOT 1.3* 1.3*  --   PROT 7.8 7.1  --   ALBUMIN 4.0 3.5 3.3*   No results for input(s): LIPASE, AMYLASE in the last 168 hours. No results for input(s): AMMONIA in the last 168 hours. Coagulation Profile: Recent Labs  Lab 12/17/2018 1735  INR 1.16   Cardiac Enzymes: Recent Labs  Lab 12/08/2018 1735  TROPONINI <0.03   BNP (last 3 results) No results for input(s): PROBNP in the last 8760 hours. HbA1C: No results for input(s): HGBA1C in the last 72 hours. CBG: Recent Labs  Lab 12/15/18 1127 12/15/18 1648 12/15/18 2121 12/16/18 0452 12/16/18 0723  GLUCAP 166* 130* 155* 300* 331*   Lipid Profile: Recent Labs    12/16/18 0700  TRIG 64   Thyroid Function Tests: No results for input(s): TSH, T4TOTAL, FREET4, T3FREE, THYROIDAB in the last 72 hours. Anemia Panel: No results for input(s): VITAMINB12, FOLATE, FERRITIN, TIBC, IRON, RETICCTPCT in the last 72 hours. Sepsis Labs: No results for input(s): PROCALCITON, LATICACIDVEN in the last 168 hours.  Recent Results (from the past  240 hour(s))  MRSA PCR Screening     Status: None   Collection Time: 12/16/18  7:09 AM  Result Value Ref Range Status   MRSA by PCR NEGATIVE NEGATIVE Final    Comment:        The GeneXpert MRSA Assay (FDA approved for NASAL specimens only), is one component of a comprehensive MRSA colonization surveillance program. It is not intended to diagnose MRSA infection nor to guide or monitor treatment for MRSA infections. Performed at [facility], [place] [address], [place], [place] [address]          Radiology Studies: No results found.      Scheduled Meds: Continuous Infusions: . dextrose       LOS: 6 days    Time spent: 30 minutes    Caitriona Sundquist Darleen Crocker, DO [facility] Pager [phone]  If 7PM-7AM, please contact night-coverage [email or web address] Password [facility] 24-Dec-2018,  12:06 PM

## 2018-12-23 NOTE — Progress Notes (Signed)
Dtr, Alma, called to check on pt, updated on status.  Pt having some labored breathing so gave Morphine IV 2 mg.  Will continue to monitor.

## 2018-12-23 NOTE — Progress Notes (Signed)
Pt resting comfortably with Morphine 2 mg. IV given for slightly labored breathing.

## 2018-12-23 NOTE — Social Work (Addendum)
12:51pm- Pt bedside RN received call from pt son. He is declining transfer to hospice home at this time. Will page MD.   11:20am- CSW left HIPPA compliant message for pt son [name] at [phone]. Await return call to discuss potential hospice placement. Per bedside RN pt remains stable for transport.  Westley Hummer, MSW, [place] Work [phone]

## 2018-12-23 DEATH — deceased

## 2019-02-04 ENCOUNTER — Ambulatory Visit: Payer: Medicare HMO | Admitting: Hematology

## 2019-02-04 ENCOUNTER — Other Ambulatory Visit: Payer: Medicare HMO
